# Patient Record
Sex: Male | Born: 1955 | Hispanic: No | Marital: Married | Smoking: Never smoker
Health system: Southern US, Community
[De-identification: ages and names within clinical notes are randomized; demographics above are authoritative.]

## PROBLEM LIST (undated history)

## (undated) HISTORY — PX: ANKLE SURGERY: SHX546

---

## 1998-10-04 ENCOUNTER — Ambulatory Visit (HOSPITAL_BASED_OUTPATIENT_CLINIC_OR_DEPARTMENT_OTHER): Admission: RE | Admit: 1998-10-04 | Discharge: 1998-10-04 | Payer: Self-pay | Admitting: Orthopaedic Surgery

## 2011-02-28 ENCOUNTER — Telehealth: Payer: Self-pay

## 2011-02-28 NOTE — Telephone Encounter (Signed)
Called. Many rings and no answer.  

## 2011-03-03 NOTE — Telephone Encounter (Signed)
Called, many rings and no answer. Mailing a letter to the address in computer. If returned see address on the referral.

## 2011-05-12 ENCOUNTER — Telehealth: Payer: Self-pay

## 2011-05-12 NOTE — Telephone Encounter (Signed)
LMOM to call. Called [phone] and number disconnected. Called [phone] and LMOM to call. ( need to confirm address also)

## 2011-05-20 NOTE — Telephone Encounter (Signed)
Called, LMOM to call.  

## 2011-06-03 NOTE — Telephone Encounter (Signed)
Letter faxed to PCP.  

## 2014-06-09 ENCOUNTER — Other Ambulatory Visit (HOSPITAL_COMMUNITY): Payer: Self-pay | Admitting: Family Medicine

## 2014-06-09 ENCOUNTER — Ambulatory Visit (HOSPITAL_COMMUNITY): Payer: Self-pay

## 2014-06-09 ENCOUNTER — Ambulatory Visit (HOSPITAL_COMMUNITY)
Admission: RE | Admit: 2014-06-09 | Discharge: 2014-06-09 | Disposition: A | Discharge status: Home / Self Care | Payer: 59 | Source: Ambulatory Visit | Attending: Family Medicine | Admitting: Family Medicine

## 2014-06-09 DIAGNOSIS — R918 Other nonspecific abnormal finding of lung field: Secondary | ICD-10-CM | POA: Diagnosis not present

## 2014-06-09 DIAGNOSIS — M25511 Pain in right shoulder: Secondary | ICD-10-CM | POA: Diagnosis not present

## 2014-06-09 DIAGNOSIS — M25512 Pain in left shoulder: Secondary | ICD-10-CM | POA: Diagnosis not present

## 2014-06-09 DIAGNOSIS — R1011 Right upper quadrant pain: Principal | ICD-10-CM

## 2014-06-09 DIAGNOSIS — G8929 Other chronic pain: Secondary | ICD-10-CM

## 2014-06-09 DIAGNOSIS — R140 Abdominal distension (gaseous): Secondary | ICD-10-CM | POA: Diagnosis not present

## 2014-06-09 MED ORDER — IOHEXOL 300 MG/ML  SOLN
100.0000 mL | Freq: Once | INTRAMUSCULAR | Status: AC | PRN
  Administered 2014-06-09: 100 mL via INTRAVENOUS

## 2014-06-16 ENCOUNTER — Other Ambulatory Visit (HOSPITAL_COMMUNITY): Payer: Self-pay | Admitting: Family Medicine

## 2014-06-16 DIAGNOSIS — R918 Other nonspecific abnormal finding of lung field: Secondary | ICD-10-CM

## 2014-06-21 ENCOUNTER — Ambulatory Visit (HOSPITAL_COMMUNITY): Payer: 59

## 2018-08-15 ENCOUNTER — Emergency Department (HOSPITAL_COMMUNITY): Payer: Self-pay

## 2018-08-15 ENCOUNTER — Encounter (HOSPITAL_COMMUNITY): Payer: Self-pay | Admitting: *Deleted

## 2018-08-15 ENCOUNTER — Other Ambulatory Visit: Payer: Self-pay

## 2018-08-15 ENCOUNTER — Emergency Department (HOSPITAL_COMMUNITY)
Admission: EM | Admit: 2018-08-15 | Discharge: 2018-08-15 | Disposition: A | Discharge status: Home / Self Care | Payer: Self-pay | Attending: Emergency Medicine | Admitting: Emergency Medicine

## 2018-08-15 DIAGNOSIS — K5900 Constipation, unspecified: Secondary | ICD-10-CM | POA: Insufficient documentation

## 2018-08-15 DIAGNOSIS — Z79899 Other long term (current) drug therapy: Secondary | ICD-10-CM | POA: Insufficient documentation

## 2018-08-15 DIAGNOSIS — N401 Enlarged prostate with lower urinary tract symptoms: Secondary | ICD-10-CM | POA: Insufficient documentation

## 2018-08-15 DIAGNOSIS — N419 Inflammatory disease of prostate, unspecified: Secondary | ICD-10-CM

## 2018-08-15 DIAGNOSIS — R339 Retention of urine, unspecified: Secondary | ICD-10-CM | POA: Insufficient documentation

## 2018-08-15 DIAGNOSIS — N410 Acute prostatitis: Secondary | ICD-10-CM | POA: Insufficient documentation

## 2018-08-15 LAB — COMPREHENSIVE METABOLIC PANEL
ALT: 23 U/L (ref 0–44)
AST: 17 U/L (ref 15–41)
Albumin: 4.1 g/dL (ref 3.5–5.0)
Alkaline Phosphatase: 74 U/L (ref 38–126)
Anion gap: 9 (ref 5–15)
BUN: 16 mg/dL (ref 8–23)
CO2: 27 mmol/L (ref 22–32)
Calcium: 9 mg/dL (ref 8.9–10.3)
Chloride: 100 mmol/L (ref 98–111)
Creatinine, Ser: 0.93 mg/dL (ref 0.61–1.24)
GFR calc Af Amer: >60 mL/min (ref >60)
GFR calc non Af Amer: >60 mL/min (ref >60)
Glucose, Bld: 154 mg/dL — ABNORMAL HIGH (ref 70–99)
Potassium: 4.4 mmol/L (ref 3.5–5.1)
Sodium: 136 mmol/L (ref 135–145)
Total Bilirubin: 1 mg/dL (ref 0.3–1.2)
Total Protein: 8 g/dL (ref 6.5–8.1)

## 2018-08-15 LAB — CBC WITH DIFFERENTIAL/PLATELET
Abs Immature Granulocytes: 0.1 10*3/uL — ABNORMAL HIGH (ref 0.00–0.07)
Basophils Absolute: 0.1 10*3/uL (ref 0.0–0.1)
Basophils Relative: 0 %
Eosinophils Absolute: 0 10*3/uL (ref 0.0–0.5)
Eosinophils Relative: 0 %
HCT: 49.3 % (ref 39.0–52.0)
Hemoglobin: 15.3 g/dL (ref 13.0–17.0)
Immature Granulocytes: 1 %
Lymphocytes Relative: 6 %
Lymphs Abs: 1.2 10*3/uL (ref 0.7–4.0)
MCH: 25.6 pg — ABNORMAL LOW (ref 26.0–34.0)
MCHC: 31 g/dL (ref 30.0–36.0)
MCV: 82.6 fL (ref 80.0–100.0)
Monocytes Absolute: 1.4 10*3/uL — ABNORMAL HIGH (ref 0.1–1.0)
Monocytes Relative: 7 %
Neutro Abs: 16.4 10*3/uL — ABNORMAL HIGH (ref 1.7–7.7)
Neutrophils Relative %: 86 %
Platelets: 344 10*3/uL (ref 150–400)
RBC: 5.97 MIL/uL — ABNORMAL HIGH (ref 4.22–5.81)
RDW: 14.6 % (ref 11.5–15.5)
WBC: 19.1 10*3/uL — ABNORMAL HIGH (ref 4.0–10.5)
nRBC: 0 % (ref 0.0–0.2)

## 2018-08-15 LAB — URINALYSIS, ROUTINE W REFLEX MICROSCOPIC
Bilirubin Urine: NEGATIVE
Glucose, UA: NEGATIVE mg/dL
Ketones, ur: NEGATIVE mg/dL
Leukocytes,Ua: NEGATIVE
Nitrite: NEGATIVE
Protein, ur: NEGATIVE mg/dL
RBC / HPF: >50 RBC/hpf — ABNORMAL HIGH (ref 0–5)
Specific Gravity, Urine: 1.013 (ref 1.005–1.030)
pH: 6 (ref 5.0–8.0)

## 2018-08-15 LAB — POC OCCULT BLOOD, ED: Fecal Occult Bld: POSITIVE — AB

## 2018-08-15 LAB — LIPASE, BLOOD: Lipase: 35 U/L (ref 11–51)

## 2018-08-15 MED ORDER — CIPROFLOXACIN HCL 500 MG PO TABS: 500.0000 mg | ORAL_TABLET | Freq: Two times a day (BID) | ORAL | 0 refills | Status: DC

## 2018-08-15 MED ORDER — SODIUM CHLORIDE 0.9 % IV BOLUS
500.0000 mL | Freq: Once | INTRAVENOUS | Status: AC
  Administered 2018-08-15: 500 mL via INTRAVENOUS

## 2018-08-15 MED ORDER — CIPROFLOXACIN HCL 250 MG PO TABS
500.0000 mg | ORAL_TABLET | Freq: Once | ORAL | Status: AC
  Administered 2018-08-15: 500 mg via ORAL
  Filled 2018-08-15: qty 2

## 2018-08-15 MED ORDER — IOHEXOL 300 MG/ML  SOLN
100.0000 mL | Freq: Once | INTRAMUSCULAR | Status: AC | PRN
  Administered 2018-08-15: 100 mL via INTRAVENOUS

## 2018-08-15 MED ORDER — TAMSULOSIN HCL 0.4 MG PO CAPS: 0.4000 mg | ORAL_CAPSULE | Freq: Every day | ORAL | 0 refills | Status: DC

## 2018-08-15 NOTE — Discharge Instructions (Addendum)
I am concerned that your prostate is infected which has contributed to "blocking" of your urine stream.  Keep the Foley catheter in place until you see the urologist.  Phone number given for [facility] urologist.  Prescription for antibiotic and medicine to increase your urine flow.

## 2018-08-15 NOTE — ED Provider Notes (Addendum)
[facility] EMERGENCY DEPARTMENT Provider Note   CSN: [id number] Arrival date & time: 08/15/18  8101    History   Chief Complaint Chief Complaint  Patient presents with  . Constipation    HPI Stephen Wilcox is a 63 y.o. male.     Chief complaint constipation.  No solid bowel movement in 1 week.  He does report a very small amount of stool in the last 3 days.  This is atypical for him.  No previous abdominal surgery.  He has used OTC stool softeners with no results.  Review of systems positive for flatus. Blood on toilet tissue and urinary retention.  Severity of symptoms is moderate.  Nothing makes symptoms better or worse.     History reviewed. No pertinent past medical history.  There are no active problems to display for this patient.   Past Surgical History:  Procedure Laterality Date  . ANKLE SURGERY     2010        Home Medications    Prior to Admission medications   Medication Sig Start Date End Date Taking? Authorizing Provider  Multiple Vitamins-Minerals (ICAPS PO) Take 1 tablet by mouth daily.    Yes [provider]  Multiple Vitamins-Minerals (MULTIVITAMIN WITH MINERALS) tablet Take 1 tablet by mouth daily.   Yes [provider]  Multiple Vitamins-Minerals (ZINC PO) Take 1 tablet by mouth daily.    Yes [provider]  ciprofloxacin (CIPRO) 500 MG tablet Take 1 tablet (500 mg total) by mouth 2 (two) times daily. 08/15/18   Nat Christen, MD  tamsulosin (FLOMAX) 0.4 MG CAPS capsule Take 1 capsule (0.4 mg total) by mouth daily. 08/15/18   Nat Christen, MD    Family History History reviewed. No pertinent family history.  Social History Social History   Tobacco Use  . Smoking status: Never Smoker  . Smokeless tobacco: Never Used  Substance Use Topics  . Alcohol use: Never    Frequency: Never  . Drug use: Never     Allergies   Patient has no known allergies.   Review of Systems Review of Systems  All other systems  reviewed and are negative.    Physical Exam Updated Vital Signs BP 130/83   Pulse 78   Temp 98.8 F (37.1 C) (Oral)   Ht 6' (1.829 m)   Wt 108.9 kg   SpO2 95%   BMI 32.55 kg/m   Physical Exam Vitals signs and nursing note reviewed.  Constitutional:      Appearance: He is well-developed.     Comments: Elevated BMI  HENT:     Head: Normocephalic and atraumatic.  Eyes:     Conjunctiva/sclera: Conjunctivae normal.  Neck:     Musculoskeletal: Neck supple.  Cardiovascular:     Rate and Rhythm: Normal rate and regular rhythm.  Pulmonary:     Effort: Pulmonary effort is normal.     Breath sounds: Normal breath sounds.  Abdominal:     General: Bowel sounds are normal.     Palpations: Abdomen is soft.  Genitourinary:    Comments: Rectal exam: No solid stool in the vault.  Brown stool.  Heme positive. Musculoskeletal: Normal range of motion.  Skin:    General: Skin is warm and dry.  Neurological:     Mental Status: He is alert and oriented to person, place, and time.  Psychiatric:        Behavior: Behavior normal.      ED Treatments / Results  Labs (  all labs ordered are listed, but only abnormal results are displayed) Labs Reviewed  CBC WITH DIFFERENTIAL/PLATELET - Abnormal; Notable for the following components:      Result Value   WBC 19.1 (*)    RBC 5.97 (*)    MCH 25.6 (*)    Neutro Abs 16.4 (*)    Monocytes Absolute 1.4 (*)    Abs Immature Granulocytes 0.10 (*)    All other components within normal limits  COMPREHENSIVE METABOLIC PANEL - Abnormal; Notable for the following components:   Glucose, Bld 154 (*)    All other components within normal limits  URINALYSIS, ROUTINE W REFLEX MICROSCOPIC - Abnormal; Notable for the following components:   Hgb urine dipstick MODERATE (*)    RBC / HPF >50 (*)    Bacteria, UA RARE (*)    All other components within normal limits  POC OCCULT BLOOD, ED - Abnormal; Notable for the following components:   Fecal Occult Bld  POSITIVE (*)    All other components within normal limits  LIPASE, BLOOD    EKG None  Radiology Dg Abdomen 1 View  Result Date: 08/15/2018 CLINICAL DATA:  Patient presents to the ER with only small hard bowel movements in 3 days. Patient states last time he has had normal bowel movement one week ago EXAM: ABDOMEN - 1 VIEW COMPARISON:  CT of the abdomen and pelvis on 06/09/2014 FINDINGS: Bowel gas pattern is nonobstructed. Moderate stool burden identified within nondilated loops of colon. Mild degenerative changes in the thoracolumbar spine. No evidence for organomegaly. IMPRESSION: Moderate stool burden.  No evidence for acute  abnormality. Electronically Signed   By: Nolon Nations M.D.   On: 08/15/2018 09:59   Ct Abdomen Pelvis W Contrast  Result Date: 08/15/2018 CLINICAL DATA:  63 year old male with abdominal pain and constipation. EXAM: CT ABDOMEN AND PELVIS WITH CONTRAST TECHNIQUE: Multidetector CT imaging of the abdomen and pelvis was performed using the standard protocol following bolus administration of intravenous contrast. CONTRAST:  181mL OMNIPAQUE IOHEXOL 300 MG/ML  SOLN COMPARISON:  06/09/2014 FINDINGS: Lower chest: No acute abnormality Hepatobiliary: The liver is unremarkable. Cholelithiasis noted without CT evidence of acute cholecystitis. No biliary dilatation. Pancreas: Unremarkable Spleen: Unremarkable Adrenals/Urinary Tract: The kidneys and adrenal glands are unremarkable except for a small LEFT renal cyst. A Foley catheter is present within the bladder. Stomach/Bowel: Stomach is within normal limits. Appendix appears normal. No evidence of bowel wall thickening, distention, or inflammatory changes. Colonic diverticulosis noted without evidence of acute diverticulitis. Vascular/Lymphatic: No significant vascular findings are present. No enlarged abdominal or pelvic lymph nodes. Reproductive: Moderate to marked prostate enlargement is noted. Other: No ascites, focal collection or  pneumoperitoneum. Musculoskeletal: No acute or suspicious bony abnormalities identified. Mild degenerative disc disease at L4-5 and L5-S1 noted. IMPRESSION: 1. No evidence of acute abnormality. 2. Cholelithiasis without CT evidence of acute cholecystitis. 3. Colonic diverticulosis without evidence of acute diverticulitis. 4. Moderate to marked prostate enlargement. Electronically Signed   By: Margarette Canada M.D.   On: 08/15/2018 11:16    Procedures Procedures (including critical care time)  Medications Ordered in ED Medications  sodium chloride 0.9 % bolus 500 mL (0 mLs Intravenous Stopped 08/15/18 1131)  iohexol (OMNIPAQUE) 300 MG/ML solution 100 mL (100 mLs Intravenous Contrast Given 08/15/18 1045)  ciprofloxacin (CIPRO) tablet 500 mg (500 mg Oral Given 08/15/18 1408)     Initial Impression / Assessment and Plan / ED Course  I have reviewed the triage vital signs and the nursing  notes.  Pertinent labs & imaging results that were available during my care of the patient were reviewed by me and considered in my medical decision making (see chart for details).       I am concerned about a bowel obstruction, constipation, infectious etiology.  Will obtain KUB, labs, CT abdomen pelvis.   Patient rechecked multiple times.  Good urinary flow after Foley catheter insertion.  My clinical suspicion is as follows: Patient developed prostatitis with concurrent BPH which is essentially resulted in urinary retention.  Foley catheter has helped this scenario.  Patient is hemodynamically stable.  Will discharge home on Cipro 500 mg twice a day.  He will follow-up with cardiology.  This was discussed in great detail with the patient.     Final Clinical Impressions(s) / ED Diagnoses   Final diagnoses:  Constipation, unspecified constipation type  Urinary retention  Prostatitis, unspecified prostatitis type    ED Discharge Orders         Ordered    ciprofloxacin (CIPRO) 500 MG tablet  2 times daily      08/15/18 1434    tamsulosin (FLOMAX) 0.4 MG CAPS capsule  Daily     08/15/18 1434           Nat Christen, MD 08/15/18 7915    Nat Christen, MD 08/15/18 [phone]

## 2018-08-15 NOTE — ED Triage Notes (Signed)
Patient presents to the ER with only small hard bowel movements in 3 days.  Patient states last time he has had normal bowel movement one week ago.  Patient used OTC stool softener yesterday at 2pm with no results. Patient has been passing gas. Patient has had no history of constipation.  Patient adds has not be able to urinate "normally" since last night.  Denies painful urination or blood in urine.

## 2018-08-20 ENCOUNTER — Ambulatory Visit (INDEPENDENT_AMBULATORY_CARE_PROVIDER_SITE_OTHER): Payer: Self-pay | Admitting: Urology

## 2018-08-20 ENCOUNTER — Other Ambulatory Visit: Payer: Self-pay

## 2018-08-20 DIAGNOSIS — R3914 Feeling of incomplete bladder emptying: Secondary | ICD-10-CM

## 2018-08-27 ENCOUNTER — Ambulatory Visit (INDEPENDENT_AMBULATORY_CARE_PROVIDER_SITE_OTHER): Payer: Self-pay | Admitting: Urology

## 2018-08-27 DIAGNOSIS — R338 Other retention of urine: Secondary | ICD-10-CM

## 2018-08-27 DIAGNOSIS — N401 Enlarged prostate with lower urinary tract symptoms: Secondary | ICD-10-CM

## 2019-09-15 ENCOUNTER — Other Ambulatory Visit (HOSPITAL_COMMUNITY): Payer: Self-pay | Admitting: Family Medicine

## 2019-09-15 ENCOUNTER — Other Ambulatory Visit: Payer: Self-pay

## 2019-09-15 ENCOUNTER — Ambulatory Visit (HOSPITAL_COMMUNITY)
Admission: RE | Admit: 2019-09-15 | Discharge: 2019-09-15 | Disposition: A | Discharge status: Home / Self Care | Payer: 59 | Source: Ambulatory Visit | Attending: Family Medicine | Admitting: Family Medicine

## 2019-09-15 DIAGNOSIS — E6609 Other obesity due to excess calories: Secondary | ICD-10-CM

## 2019-09-15 DIAGNOSIS — R0781 Pleurodynia: Secondary | ICD-10-CM

## 2019-09-24 ENCOUNTER — Emergency Department (HOSPITAL_COMMUNITY): Payer: 59

## 2019-09-24 ENCOUNTER — Encounter (HOSPITAL_COMMUNITY): Payer: Self-pay | Admitting: *Deleted

## 2019-09-24 ENCOUNTER — Other Ambulatory Visit: Payer: Self-pay

## 2019-09-24 ENCOUNTER — Emergency Department (HOSPITAL_COMMUNITY)
Admission: EM | Admit: 2019-09-24 | Discharge: 2019-09-24 | Disposition: A | Discharge status: Home / Self Care | Payer: 59 | Attending: Emergency Medicine | Admitting: Emergency Medicine

## 2019-09-24 DIAGNOSIS — R5383 Other fatigue: Secondary | ICD-10-CM | POA: Diagnosis present

## 2019-09-24 DIAGNOSIS — U071 COVID-19: Secondary | ICD-10-CM | POA: Diagnosis not present

## 2019-09-24 DIAGNOSIS — R531 Weakness: Secondary | ICD-10-CM

## 2019-09-24 LAB — CBC
HCT: 31.9 % — ABNORMAL LOW (ref 39.0–52.0)
Hemoglobin: 9.1 g/dL — ABNORMAL LOW (ref 13.0–17.0)
MCH: 21.6 pg — ABNORMAL LOW (ref 26.0–34.0)
MCHC: 28.5 g/dL — ABNORMAL LOW (ref 30.0–36.0)
MCV: 75.6 fL — ABNORMAL LOW (ref 80.0–100.0)
Platelets: 576 10*3/uL — ABNORMAL HIGH (ref 150–400)
RBC: 4.22 MIL/uL (ref 4.22–5.81)
RDW: 15.4 % (ref 11.5–15.5)
WBC: 9.3 10*3/uL (ref 4.0–10.5)
nRBC: 0 % (ref 0.0–0.2)

## 2019-09-24 LAB — BASIC METABOLIC PANEL
Anion gap: 13 (ref 5–15)
BUN: 23 mg/dL (ref 8–23)
CO2: 25 mmol/L (ref 22–32)
Calcium: 8.5 mg/dL — ABNORMAL LOW (ref 8.9–10.3)
Chloride: 93 mmol/L — ABNORMAL LOW (ref 98–111)
Creatinine, Ser: 1.01 mg/dL (ref 0.61–1.24)
GFR calc Af Amer: >60 mL/min (ref >60)
GFR calc non Af Amer: >60 mL/min (ref >60)
Glucose, Bld: 165 mg/dL — ABNORMAL HIGH (ref 70–99)
Potassium: 3.6 mmol/L (ref 3.5–5.1)
Sodium: 131 mmol/L — ABNORMAL LOW (ref 135–145)

## 2019-09-24 LAB — SARS CORONAVIRUS 2 BY RT PCR (HOSPITAL ORDER, PERFORMED IN ~~LOC~~ HOSPITAL LAB): SARS Coronavirus 2: POSITIVE — AB

## 2019-09-24 MED ORDER — SODIUM CHLORIDE 0.9 % IV BOLUS
1000.0000 mL | Freq: Once | INTRAVENOUS | Status: AC
  Administered 2019-09-24: 1000 mL via INTRAVENOUS

## 2019-09-24 NOTE — Discharge Instructions (Signed)
You were evaluated in the Emergency Department and after careful evaluation, we did not find any emergent condition requiring admission or further testing in the hospital.  Your exam/testing today was overall reassuring.  Please return to the Emergency Department if you experience any worsening of your condition.  Thank you for allowing us to be a part of your care.  

## 2019-09-24 NOTE — ED Provider Notes (Signed)
[facility] Emergency Department Provider Note MRN:  [id number]  Arrival date & time: 09/24/19     Chief Complaint   Fatigue   History of Present Illness   Stephen Wilcox is a 64 y.o. year-old male with no pertinent past medical history presenting to the ED with chief complaint of fatigue.  4 days of severe fatigue, low energy, denies fever, no cough, no chest pain, no shortness of breath, no abdominal pain but does endorse several months of abdominal bloating occasionally and excessive flatulence.  Denies burning with urination, no numbness or weakness, does endorse lightheadedness with standing for the past few days.  Wife is hospitalized with Covid.  Review of Systems  A complete 10 system review of systems was obtained and all systems are negative except as noted in the HPI and PMH.   Patient's Health History   History reviewed. No pertinent past medical history.  Past Surgical History:  Procedure Laterality Date  . ANKLE SURGERY     2010    No family history on file.  Social History   Socioeconomic History  . Marital status: Married    Spouse name: Not on file  . Number of children: Not on file  . Years of education: Not on file  . Highest education level: Not on file  Occupational History  . Not on file  Tobacco Use  . Smoking status: Never Smoker  . Smokeless tobacco: Never Used  Substance and Sexual Activity  . Alcohol use: Never  . Drug use: Never  . Sexual activity: Not on file  Other Topics Concern  . Not on file  Social History Narrative  . Not on file   Social Determinants of Health   Financial Resource Strain:   . Difficulty of Paying Living Expenses: Not on file  Food Insecurity:   . Worried About Charity fundraiser in the Last Year: Not on file  . Ran Out of Food in the Last Year: Not on file  Transportation Needs:   . Lack of Transportation (Medical): Not on file  . Lack of Transportation (Non-Medical): Not on file   Physical Activity:   . Days of Exercise per Week: Not on file  . Minutes of Exercise per Session: Not on file  Stress:   . Feeling of Stress : Not on file  Social Connections:   . Frequency of Communication with Friends and Family: Not on file  . Frequency of Social Gatherings with Friends and Family: Not on file  . Attends Religious Services: Not on file  . Active Member of Clubs or Organizations: Not on file  . Attends Archivist Meetings: Not on file  . Marital Status: Not on file  Intimate Partner Violence:   . Fear of Current or Ex-Partner: Not on file  . Emotionally Abused: Not on file  . Physically Abused: Not on file  . Sexually Abused: Not on file     Physical Exam   Vitals:   09/24/19 2000 09/24/19 2100  BP: 132/88 135/74  Pulse: 92 87  Resp:  18  Temp:    SpO2: 99% 90%    CONSTITUTIONAL: Well-appearing, NAD NEURO:  Alert and oriented x 3, no focal deficits EYES:  eyes equal and reactive ENT/NECK:  no LAD, no JVD CARDIO: Regular rate, well-perfused, normal S1 and S2 PULM:  CTAB no wheezing or rhonchi GI/GU:  normal bowel sounds, non-distended, non-tender MSK/SPINE:  No gross deformities, no edema SKIN:  no rash,  atraumatic PSYCH:  Appropriate speech and behavior  *Additional and/or pertinent findings included in MDM below  Diagnostic and Interventional Summary    EKG Interpretation  Date/Time:    Ventricular Rate:    PR Interval:    QRS Duration:   QT Interval:    QTC Calculation:   R Axis:     Text Interpretation:        Labs Reviewed  SARS CORONAVIRUS 2 BY RT PCR (HOSPITAL ORDER, [place] [facility]) - Abnormal; Notable for the following components:      Result Value   SARS Coronavirus 2 POSITIVE (*)    All other components within normal limits  BASIC METABOLIC PANEL - Abnormal; Notable for the following components:   Sodium 131 (*)    Chloride 93 (*)    Glucose, Bld 165 (*)    Calcium 8.5 (*)    All other  components within normal limits  CBC - Abnormal; Notable for the following components:   Hemoglobin 9.1 (*)    HCT 31.9 (*)    MCV 75.6 (*)    MCH 21.6 (*)    MCHC 28.5 (*)    Platelets 576 (*)    All other components within normal limits  CBG MONITORING, ED    DG Chest Port 1 View  Final Result      Medications  sodium chloride 0.9 % bolus 1,000 mL (0 mLs Intravenous Stopped 09/24/19 2112)     Procedures  /  Critical Care Procedures  ED Course and Medical Decision Making  I have reviewed the triage vital signs, the nursing notes, and pertinent available records from the EMR.  Listed above are laboratory and imaging tests that I personally ordered, reviewed, and interpreted and then considered in my medical decision making (see below for details).  Symptoms fairly explained by COVID-19, has tested positive here in the emergency department.  No increased work of breathing, normal vital signs, no hypoxia, labs revealing hyponatremia favored to be due to dehydration, providing IV fluids, anticipating discharge.       Barth Kirks. [place] Small, [place] [email or web address]  Final Clinical Impressions(s) / ED Diagnoses     ICD-10-CM   1. Weakness  R53.1   2. COVID-19  U07.1     ED Discharge Orders    None       Discharge Instructions Discussed with and Provided to Patient:     Discharge Instructions     You were evaluated in the Emergency Department and after careful evaluation, we did not find any emergent condition requiring admission or further testing in the hospital.  Your exam/testing today was overall reassuring.  Please return to the Emergency Department if you experience any worsening of your condition.  Thank you for allowing [place] to be a part of your care.        Maudie Flakes, MD 09/24/19 2208

## 2019-09-24 NOTE — ED Triage Notes (Signed)
Weak for 4 days

## 2019-09-25 ENCOUNTER — Telehealth (HOSPITAL_COMMUNITY): Payer: Self-pay | Admitting: Oncology

## 2019-09-25 NOTE — Telephone Encounter (Signed)
.  Called to Discuss with patient about Covid symptoms and the use of the monoclonal antibody infusion for those with mild to moderate Covid symptoms and at a high risk of hospitalization.     Pt is qualified for this infusion due to co-morbid conditions and/or a member of an at-risk group.     There are no problems to display for this patient.   Patient declines infusion at this time. Symptoms tier reviewed as well as criteria for ending isolation. Preventative practices reviewed. Patient verbalized understanding.    Patient advised to call back if he/she decides that he/she does want to get infusion. Callback number to the infusion center given. Patient advised to go to Urgent care or ED with severe symptoms.    Faythe Casa, NP 09/25/2019 10:14 AM

## 2019-09-28 ENCOUNTER — Emergency Department (HOSPITAL_COMMUNITY): Payer: 59

## 2019-09-28 ENCOUNTER — Encounter (HOSPITAL_COMMUNITY): Payer: Self-pay | Admitting: Emergency Medicine

## 2019-09-28 ENCOUNTER — Inpatient Hospital Stay (HOSPITAL_COMMUNITY)
Admission: EM | Admit: 2019-09-28 | Discharge: 2019-10-25 | DRG: 177 | Disposition: A | Discharge status: Hospice | Payer: 59 | Attending: Internal Medicine | Admitting: Internal Medicine

## 2019-09-28 DIAGNOSIS — Z515 Encounter for palliative care: Secondary | ICD-10-CM | POA: Diagnosis not present

## 2019-09-28 DIAGNOSIS — N390 Urinary tract infection, site not specified: Secondary | ICD-10-CM | POA: Diagnosis not present

## 2019-09-28 DIAGNOSIS — R6521 Severe sepsis with septic shock: Secondary | ICD-10-CM | POA: Diagnosis not present

## 2019-09-28 DIAGNOSIS — R160 Hepatomegaly, not elsewhere classified: Secondary | ICD-10-CM

## 2019-09-28 DIAGNOSIS — I38 Endocarditis, valve unspecified: Secondary | ICD-10-CM | POA: Diagnosis not present

## 2019-09-28 DIAGNOSIS — U071 COVID-19: Principal | ICD-10-CM | POA: Diagnosis present

## 2019-09-28 DIAGNOSIS — E876 Hypokalemia: Secondary | ICD-10-CM

## 2019-09-28 DIAGNOSIS — R1310 Dysphagia, unspecified: Secondary | ICD-10-CM | POA: Diagnosis not present

## 2019-09-28 DIAGNOSIS — D75839 Thrombocytosis, unspecified: Secondary | ICD-10-CM

## 2019-09-28 DIAGNOSIS — E872 Acidosis, unspecified: Secondary | ICD-10-CM

## 2019-09-28 DIAGNOSIS — D509 Iron deficiency anemia, unspecified: Secondary | ICD-10-CM

## 2019-09-28 DIAGNOSIS — G9341 Metabolic encephalopathy: Secondary | ICD-10-CM | POA: Diagnosis not present

## 2019-09-28 DIAGNOSIS — I4892 Unspecified atrial flutter: Secondary | ICD-10-CM | POA: Diagnosis not present

## 2019-09-28 DIAGNOSIS — C169 Malignant neoplasm of stomach, unspecified: Secondary | ICD-10-CM | POA: Diagnosis present

## 2019-09-28 DIAGNOSIS — D473 Essential (hemorrhagic) thrombocythemia: Secondary | ICD-10-CM

## 2019-09-28 DIAGNOSIS — A4151 Sepsis due to Escherichia coli [E. coli]: Secondary | ICD-10-CM | POA: Diagnosis not present

## 2019-09-28 DIAGNOSIS — E669 Obesity, unspecified: Secondary | ICD-10-CM | POA: Diagnosis present

## 2019-09-28 DIAGNOSIS — Z7189 Other specified counseling: Secondary | ICD-10-CM | POA: Diagnosis not present

## 2019-09-28 DIAGNOSIS — R933 Abnormal findings on diagnostic imaging of other parts of digestive tract: Secondary | ICD-10-CM | POA: Diagnosis not present

## 2019-09-28 DIAGNOSIS — K317 Polyp of stomach and duodenum: Secondary | ICD-10-CM | POA: Diagnosis present

## 2019-09-28 DIAGNOSIS — E11649 Type 2 diabetes mellitus with hypoglycemia without coma: Secondary | ICD-10-CM | POA: Diagnosis not present

## 2019-09-28 DIAGNOSIS — F4321 Adjustment disorder with depressed mood: Secondary | ICD-10-CM | POA: Diagnosis not present

## 2019-09-28 DIAGNOSIS — K922 Gastrointestinal hemorrhage, unspecified: Secondary | ICD-10-CM | POA: Diagnosis not present

## 2019-09-28 DIAGNOSIS — E871 Hypo-osmolality and hyponatremia: Secondary | ICD-10-CM

## 2019-09-28 DIAGNOSIS — Z66 Do not resuscitate: Secondary | ICD-10-CM | POA: Diagnosis not present

## 2019-09-28 DIAGNOSIS — J069 Acute upper respiratory infection, unspecified: Secondary | ICD-10-CM | POA: Diagnosis present

## 2019-09-28 DIAGNOSIS — K3189 Other diseases of stomach and duodenum: Secondary | ICD-10-CM | POA: Diagnosis not present

## 2019-09-28 DIAGNOSIS — N401 Enlarged prostate with lower urinary tract symptoms: Secondary | ICD-10-CM | POA: Diagnosis not present

## 2019-09-28 DIAGNOSIS — D649 Anemia, unspecified: Secondary | ICD-10-CM

## 2019-09-28 DIAGNOSIS — J9601 Acute respiratory failure with hypoxia: Secondary | ICD-10-CM

## 2019-09-28 DIAGNOSIS — J1282 Pneumonia due to coronavirus disease 2019: Secondary | ICD-10-CM | POA: Diagnosis present

## 2019-09-28 DIAGNOSIS — D62 Acute posthemorrhagic anemia: Secondary | ICD-10-CM | POA: Diagnosis present

## 2019-09-28 DIAGNOSIS — R627 Adult failure to thrive: Secondary | ICD-10-CM | POA: Diagnosis not present

## 2019-09-28 DIAGNOSIS — A419 Sepsis, unspecified organism: Secondary | ICD-10-CM

## 2019-09-28 DIAGNOSIS — R739 Hyperglycemia, unspecified: Secondary | ICD-10-CM | POA: Diagnosis not present

## 2019-09-28 DIAGNOSIS — Z6832 Body mass index (BMI) 32.0-32.9, adult: Secondary | ICD-10-CM | POA: Diagnosis not present

## 2019-09-28 DIAGNOSIS — I4891 Unspecified atrial fibrillation: Secondary | ICD-10-CM

## 2019-09-28 DIAGNOSIS — C160 Malignant neoplasm of cardia: Secondary | ICD-10-CM | POA: Diagnosis not present

## 2019-09-28 DIAGNOSIS — R338 Other retention of urine: Secondary | ICD-10-CM | POA: Diagnosis not present

## 2019-09-28 DIAGNOSIS — N179 Acute kidney failure, unspecified: Secondary | ICD-10-CM | POA: Diagnosis not present

## 2019-09-28 DIAGNOSIS — T380X5A Adverse effect of glucocorticoids and synthetic analogues, initial encounter: Secondary | ICD-10-CM | POA: Diagnosis not present

## 2019-09-28 DIAGNOSIS — C787 Secondary malignant neoplasm of liver and intrahepatic bile duct: Secondary | ICD-10-CM

## 2019-09-28 DIAGNOSIS — E1165 Type 2 diabetes mellitus with hyperglycemia: Secondary | ICD-10-CM | POA: Diagnosis present

## 2019-09-28 DIAGNOSIS — C155 Malignant neoplasm of lower third of esophagus: Secondary | ICD-10-CM | POA: Diagnosis not present

## 2019-09-28 DIAGNOSIS — Z95828 Presence of other vascular implants and grafts: Secondary | ICD-10-CM

## 2019-09-28 DIAGNOSIS — D500 Iron deficiency anemia secondary to blood loss (chronic): Secondary | ICD-10-CM | POA: Diagnosis not present

## 2019-09-28 DIAGNOSIS — R748 Abnormal levels of other serum enzymes: Secondary | ICD-10-CM | POA: Diagnosis not present

## 2019-09-28 DIAGNOSIS — R6520 Severe sepsis without septic shock: Secondary | ICD-10-CM

## 2019-09-28 LAB — CBC WITH DIFFERENTIAL/PLATELET
Abs Immature Granulocytes: 0.11 10*3/uL — ABNORMAL HIGH (ref 0.00–0.07)
Basophils Absolute: 0 10*3/uL (ref 0.0–0.1)
Basophils Relative: 0 %
Eosinophils Absolute: 0 10*3/uL (ref 0.0–0.5)
Eosinophils Relative: 0 %
HCT: 25.5 % — ABNORMAL LOW (ref 39.0–52.0)
Hemoglobin: 7.3 g/dL — ABNORMAL LOW (ref 13.0–17.0)
Immature Granulocytes: 1 %
Lymphocytes Relative: 3 %
Lymphs Abs: 0.2 10*3/uL — ABNORMAL LOW (ref 0.7–4.0)
MCH: 21.6 pg — ABNORMAL LOW (ref 26.0–34.0)
MCHC: 28.6 g/dL — ABNORMAL LOW (ref 30.0–36.0)
MCV: 75.4 fL — ABNORMAL LOW (ref 80.0–100.0)
Monocytes Absolute: 0.3 10*3/uL (ref 0.1–1.0)
Monocytes Relative: 3 %
Neutro Abs: 9.1 10*3/uL — ABNORMAL HIGH (ref 1.7–7.7)
Neutrophils Relative %: 93 %
Platelets: 545 10*3/uL — ABNORMAL HIGH (ref 150–400)
RBC: 3.38 MIL/uL — ABNORMAL LOW (ref 4.22–5.81)
RDW: 15.9 % — ABNORMAL HIGH (ref 11.5–15.5)
WBC: 9.7 10*3/uL (ref 4.0–10.5)
nRBC: 0.2 % (ref 0.0–0.2)

## 2019-09-28 LAB — COMPREHENSIVE METABOLIC PANEL
ALT: 35 U/L (ref 0–44)
AST: 53 U/L — ABNORMAL HIGH (ref 15–41)
Albumin: 2.8 g/dL — ABNORMAL LOW (ref 3.5–5.0)
Alkaline Phosphatase: 55 U/L (ref 38–126)
Anion gap: 12 (ref 5–15)
BUN: 18 mg/dL (ref 8–23)
CO2: 28 mmol/L (ref 22–32)
Calcium: 8 mg/dL — ABNORMAL LOW (ref 8.9–10.3)
Chloride: 91 mmol/L — ABNORMAL LOW (ref 98–111)
Creatinine, Ser: 0.9 mg/dL (ref 0.61–1.24)
GFR calc Af Amer: >60 mL/min (ref >60)
GFR calc non Af Amer: >60 mL/min (ref >60)
Glucose, Bld: 198 mg/dL — ABNORMAL HIGH (ref 70–99)
Potassium: 3.3 mmol/L — ABNORMAL LOW (ref 3.5–5.1)
Sodium: 131 mmol/L — ABNORMAL LOW (ref 135–145)
Total Bilirubin: 0.4 mg/dL (ref 0.3–1.2)
Total Protein: 6.7 g/dL (ref 6.5–8.1)

## 2019-09-28 LAB — D-DIMER, QUANTITATIVE: D-Dimer, Quant: 1.89 ug/mL-FEU — ABNORMAL HIGH (ref 0.00–0.50)

## 2019-09-28 LAB — FIBRINOGEN: Fibrinogen: 698 mg/dL — ABNORMAL HIGH (ref 210–475)

## 2019-09-28 LAB — TRIGLYCERIDES: Triglycerides: 49 mg/dL (ref <150)

## 2019-09-28 LAB — PROCALCITONIN: Procalcitonin: 0.17 ng/mL

## 2019-09-28 LAB — LACTATE DEHYDROGENASE: LDH: 304 U/L — ABNORMAL HIGH (ref 98–192)

## 2019-09-28 LAB — C-REACTIVE PROTEIN: CRP: 17.4 mg/dL — ABNORMAL HIGH (ref <1.0)

## 2019-09-28 LAB — FERRITIN: Ferritin: 59 ng/mL (ref 24–336)

## 2019-09-28 MED ORDER — GUAIFENESIN-DM 100-10 MG/5ML PO SYRP
10.0000 mL | ORAL_SOLUTION | ORAL | Status: DC | PRN
  Administered 2019-09-29 – 2019-10-22 (×4): 10 mL via ORAL
  Filled 2019-09-28 (×5): qty 10

## 2019-09-28 MED ORDER — ZINC SULFATE 220 (50 ZN) MG PO CAPS
220.0000 mg | ORAL_CAPSULE | Freq: Every day | ORAL | Status: DC
  Administered 2019-09-29 – 2019-10-25 (×27): 220 mg via ORAL
  Filled 2019-09-28 (×27): qty 1

## 2019-09-28 MED ORDER — ALBUTEROL SULFATE HFA 108 (90 BASE) MCG/ACT IN AERS
2.0000 | INHALATION_SPRAY | Freq: Four times a day (QID) | RESPIRATORY_TRACT | Status: DC
  Administered 2019-09-29 – 2019-10-01 (×10): 2 via RESPIRATORY_TRACT
  Filled 2019-09-28 (×2): qty 6.7

## 2019-09-28 MED ORDER — SODIUM CHLORIDE 0.9 % IV SOLN
100.0000 mg | Freq: Every day | INTRAVENOUS | Status: AC
  Administered 2019-09-30 – 2019-10-03 (×4): 100 mg via INTRAVENOUS
  Filled 2019-09-28 (×4): qty 20

## 2019-09-28 MED ORDER — SODIUM CHLORIDE 0.9 % IV SOLN: 100.0000 mg | Freq: Every day | INTRAVENOUS | Status: DC

## 2019-09-28 MED ORDER — ASCORBIC ACID 500 MG PO TABS
500.0000 mg | ORAL_TABLET | Freq: Every day | ORAL | Status: DC
  Administered 2019-09-29 – 2019-10-25 (×27): 500 mg via ORAL
  Filled 2019-09-28 (×27): qty 1

## 2019-09-28 MED ORDER — DEXAMETHASONE SODIUM PHOSPHATE 10 MG/ML IJ SOLN
10.0000 mg | Freq: Once | INTRAMUSCULAR | Status: AC
  Administered 2019-09-28: 10 mg via INTRAVENOUS
  Filled 2019-09-28: qty 1

## 2019-09-28 MED ORDER — SODIUM CHLORIDE 0.9 % IV SOLN: 200.0000 mg | Freq: Once | INTRAVENOUS | Status: DC

## 2019-09-28 MED ORDER — SODIUM CHLORIDE 0.9 % IV SOLN
10.0000 mL/h | Freq: Once | INTRAVENOUS | Status: AC
  Administered 2019-09-29: 10 mL/h via INTRAVENOUS

## 2019-09-28 MED ORDER — SODIUM CHLORIDE 0.9 % IV SOLN
100.0000 mg | INTRAVENOUS | Status: AC
  Administered 2019-09-29 (×2): 100 mg via INTRAVENOUS
  Filled 2019-09-28: qty 20

## 2019-09-28 MED ORDER — DEXAMETHASONE SODIUM PHOSPHATE 10 MG/ML IJ SOLN: 6.0000 mg | INTRAMUSCULAR | Status: DC

## 2019-09-28 MED ORDER — HYDROCOD POLST-CPM POLST ER 10-8 MG/5ML PO SUER
5.0000 mL | Freq: Two times a day (BID) | ORAL | Status: DC | PRN
  Administered 2019-09-30 – 2019-10-07 (×5): 5 mL via ORAL
  Filled 2019-09-28 (×7): qty 5

## 2019-09-28 NOTE — ED Provider Notes (Signed)
[facility] Emergency Department Provider Note MRN:  [id number]  Arrival date & time: 09/28/19     Chief Complaint   Covid Positive   History of Present Illness   Stephen Wilcox is a 64 y.o. year-old male with no pertinent past medical history presenting to the ED with chief complaint of Covid positive.  Patient began feeling ill about 5 or 6 days ago, was seen here in the emergency department for severe weakness, discharged with diagnosis of COVID-19.  Has gotten worse and worse with shortness of breath, worsening cough, found to be hypoxic with EMS, down to 82%, feeling better now on oxygen.  Review of Systems  A complete 10 system review of systems was obtained and all systems are negative except as noted in the HPI and PMH.   Patient's Health History   History reviewed. No pertinent past medical history.  Past Surgical History:  Procedure Laterality Date   ANKLE SURGERY     2010    No family history on file.  Social History   Socioeconomic History   Marital status: Married    Spouse name: Not on file   Number of children: Not on file   Years of education: Not on file   Highest education level: Not on file  Occupational History   Not on file  Tobacco Use   Smoking status: Never Smoker   Smokeless tobacco: Never Used  Substance and Sexual Activity   Alcohol use: Never   Drug use: Never   Sexual activity: Not on file  Other Topics Concern   Not on file  Social History Narrative   Not on file   Social Determinants of Health   Financial Resource Strain:    Difficulty of Paying Living Expenses: Not on file  Food Insecurity:    Worried About [place] in the Last Year: Not on file   Ran Out of Food in the Last Year: Not on file  Transportation Needs:    Lack of Transportation (Medical): Not on file   Lack of Transportation (Non-Medical): Not on file  Physical Activity:    Days of Exercise per Week: Not on  file   Minutes of Exercise per Session: Not on file  Stress:    Feeling of Stress : Not on file  Social Connections:    Frequency of Communication with Friends and Family: Not on file   Frequency of Social Gatherings with Friends and Family: Not on file   Attends Religious Services: Not on file   Active Member of Clubs or Organizations: Not on file   Attends Archivist Meetings: Not on file   Marital Status: Not on file  Intimate Partner Violence:    Fear of Current or Ex-Partner: Not on file   Emotionally Abused: Not on file   Physically Abused: Not on file   Sexually Abused: Not on file     Physical Exam   Vitals:   09/28/19 2210  BP: 112/64  Pulse: (!) 108  Resp: (!) 28  Temp: (!) 101.6 F (38.7 C)  SpO2: 93%    CONSTITUTIONAL: Well-appearing, NAD NEURO:  Alert and oriented x 3, no focal deficits EYES:  eyes equal and reactive ENT/NECK:  no LAD, no JVD CARDIO: Tachycardic rate, well-perfused, normal S1 and S2 PULM:  CTAB no wheezing or rhonchi GI/GU:  normal bowel sounds, non-distended, non-tender MSK/SPINE:  No gross deformities, no edema SKIN:  no rash, atraumatic PSYCH:  Appropriate speech and  behavior  *Additional and/or pertinent findings included in MDM below  Diagnostic and Interventional Summary    EKG Interpretation  Date/Time:    Ventricular Rate:    PR Interval:    QRS Duration:   QT Interval:    QTC Calculation:   R Axis:     Text Interpretation:        Labs Reviewed  CBC WITH DIFFERENTIAL/PLATELET - Abnormal; Notable for the following components:      Result Value   RBC 3.38 (*)    Hemoglobin 7.3 (*)    HCT 25.5 (*)    MCV 75.4 (*)    MCH 21.6 (*)    MCHC 28.6 (*)    RDW 15.9 (*)    Platelets 545 (*)    Neutro Abs 9.1 (*)    Lymphs Abs 0.2 (*)    Abs Immature Granulocytes 0.11 (*)    All other components within normal limits  COMPREHENSIVE METABOLIC PANEL - Abnormal; Notable for the following components:    Sodium 131 (*)    Potassium 3.3 (*)    Chloride 91 (*)    Glucose, Bld 198 (*)    Calcium 8.0 (*)    Albumin 2.8 (*)    AST 53 (*)    All other components within normal limits  D-DIMER, QUANTITATIVE (NOT AT [facility]) - Abnormal; Notable for the following components:   D-Dimer, Quant 1.89 (*)    All other components within normal limits  LACTATE DEHYDROGENASE - Abnormal; Notable for the following components:   LDH 304 (*)    All other components within normal limits  FIBRINOGEN - Abnormal; Notable for the following components:   Fibrinogen 698 (*)    All other components within normal limits  C-REACTIVE PROTEIN - Abnormal; Notable for the following components:   CRP 17.4 (*)    All other components within normal limits  CULTURE, BLOOD (ROUTINE X 2)  CULTURE, BLOOD (ROUTINE X 2)  PROCALCITONIN  FERRITIN  TRIGLYCERIDES  LACTIC ACID, PLASMA  LACTIC ACID, PLASMA  PREPARE RBC (CROSSMATCH)    DG Chest Port 1 View  Final Result      Medications  0.9 %  sodium chloride infusion (has no administration in time range)  dexamethasone (DECADRON) injection 10 mg (10 mg Intravenous Given 09/28/19 2254)     Procedures  /  Critical Care .Critical Care Performed by: Maudie Flakes, MD Authorized by: Maudie Flakes, MD   Critical care provider statement:    Critical care time (minutes):  32   Critical care was necessary to treat or prevent imminent or life-threatening deterioration of the following conditions: COVID-19 with hypoxic respiratory failure.   Critical care was time spent personally by me on the following activities:  Discussions with consultants, evaluation of patient's response to treatment, examination of patient, ordering and performing treatments and interventions, ordering and review of laboratory studies, ordering and review of radiographic studies, pulse oximetry, re-evaluation of patient's condition, obtaining history from patient or surrogate and review of old  charts    ED Course and Medical Decision Making  I have reviewed the triage vital signs, the nursing notes, and pertinent available records from the EMR.  Listed above are laboratory and imaging tests that I personally ordered, reviewed, and interpreted and then considered in my medical decision making (see below for details).  COVID-19 with new hypoxia, currently requiring 3 L nasal cannula.  In no acute distress, febrile, mildly tachycardic.  Awaiting laboratory assessment, will need admission.  Labs returned with significant downtrend in hemoglobin compared to 4 days prior.  Hemoccult is positive.  Given patient's oxygen requirement will transfuse 2 units packed red blood cells.  Admitted to hospital service for further care.      Barth Kirks. Sedonia Small, MD [place] [email or web address]  Final Clinical Impressions(s) / ED Diagnoses     ICD-10-CM   1. COVID-19  U07.1   2. Acute respiratory failure with hypoxia (HCC)  J96.01     ED Discharge Orders    None       Discharge Instructions Discussed with and Provided to Patient:   Discharge Instructions   None       Maudie Flakes, MD 09/28/19 2353

## 2019-09-28 NOTE — ED Triage Notes (Addendum)
Pt C/O weakness and dizziness. Pt O2 on RA upon EMS arrival 82%. Pt is 94% on 2L at this time. Pt COVID positive.

## 2019-09-29 DIAGNOSIS — K922 Gastrointestinal hemorrhage, unspecified: Secondary | ICD-10-CM

## 2019-09-29 DIAGNOSIS — R739 Hyperglycemia, unspecified: Secondary | ICD-10-CM

## 2019-09-29 DIAGNOSIS — D75839 Thrombocytosis, unspecified: Secondary | ICD-10-CM

## 2019-09-29 DIAGNOSIS — U071 COVID-19: Secondary | ICD-10-CM | POA: Diagnosis present

## 2019-09-29 DIAGNOSIS — A419 Sepsis, unspecified organism: Secondary | ICD-10-CM

## 2019-09-29 DIAGNOSIS — D649 Anemia, unspecified: Secondary | ICD-10-CM

## 2019-09-29 DIAGNOSIS — R748 Abnormal levels of other serum enzymes: Secondary | ICD-10-CM

## 2019-09-29 DIAGNOSIS — E872 Acidosis, unspecified: Secondary | ICD-10-CM

## 2019-09-29 DIAGNOSIS — E876 Hypokalemia: Secondary | ICD-10-CM

## 2019-09-29 DIAGNOSIS — D509 Iron deficiency anemia, unspecified: Secondary | ICD-10-CM

## 2019-09-29 DIAGNOSIS — J9601 Acute respiratory failure with hypoxia: Secondary | ICD-10-CM

## 2019-09-29 DIAGNOSIS — C787 Secondary malignant neoplasm of liver and intrahepatic bile duct: Secondary | ICD-10-CM

## 2019-09-29 DIAGNOSIS — R6521 Severe sepsis with septic shock: Secondary | ICD-10-CM

## 2019-09-29 DIAGNOSIS — E871 Hypo-osmolality and hyponatremia: Secondary | ICD-10-CM

## 2019-09-29 DIAGNOSIS — J069 Acute upper respiratory infection, unspecified: Secondary | ICD-10-CM | POA: Diagnosis present

## 2019-09-29 LAB — CBC WITH DIFFERENTIAL/PLATELET
Abs Immature Granulocytes: 0.04 10*3/uL (ref 0.00–0.07)
Basophils Absolute: 0 10*3/uL (ref 0.0–0.1)
Basophils Relative: 0 %
Eosinophils Absolute: 0 10*3/uL (ref 0.0–0.5)
Eosinophils Relative: 0 %
HCT: 24.6 % — ABNORMAL LOW (ref 39.0–52.0)
Hemoglobin: 7 g/dL — ABNORMAL LOW (ref 13.0–17.0)
Immature Granulocytes: 1 %
Lymphocytes Relative: 6 %
Lymphs Abs: 0.3 10*3/uL — ABNORMAL LOW (ref 0.7–4.0)
MCH: 21.4 pg — ABNORMAL LOW (ref 26.0–34.0)
MCHC: 28.5 g/dL — ABNORMAL LOW (ref 30.0–36.0)
MCV: 75.2 fL — ABNORMAL LOW (ref 80.0–100.0)
Monocytes Absolute: 0.1 10*3/uL (ref 0.1–1.0)
Monocytes Relative: 2 %
Neutro Abs: 4.3 10*3/uL (ref 1.7–7.7)
Neutrophils Relative %: 91 %
Platelets: 453 10*3/uL — ABNORMAL HIGH (ref 150–400)
RBC: 3.27 MIL/uL — ABNORMAL LOW (ref 4.22–5.81)
RDW: 15.7 % — ABNORMAL HIGH (ref 11.5–15.5)
WBC: 4.8 10*3/uL (ref 4.0–10.5)
nRBC: 0.4 % — ABNORMAL HIGH (ref 0.0–0.2)

## 2019-09-29 LAB — COMPREHENSIVE METABOLIC PANEL
ALT: 35 U/L (ref 0–44)
AST: 59 U/L — ABNORMAL HIGH (ref 15–41)
Albumin: 2.3 g/dL — ABNORMAL LOW (ref 3.5–5.0)
Alkaline Phosphatase: 48 U/L (ref 38–126)
Anion gap: 12 (ref 5–15)
BUN: 18 mg/dL (ref 8–23)
CO2: 27 mmol/L (ref 22–32)
Calcium: 7.9 mg/dL — ABNORMAL LOW (ref 8.9–10.3)
Chloride: 94 mmol/L — ABNORMAL LOW (ref 98–111)
Creatinine, Ser: 0.73 mg/dL (ref 0.61–1.24)
GFR calc Af Amer: >60 mL/min (ref >60)
GFR calc non Af Amer: >60 mL/min (ref >60)
Glucose, Bld: 236 mg/dL — ABNORMAL HIGH (ref 70–99)
Potassium: 4.8 mmol/L (ref 3.5–5.1)
Sodium: 133 mmol/L — ABNORMAL LOW (ref 135–145)
Total Bilirubin: 0.9 mg/dL (ref 0.3–1.2)
Total Protein: 5.7 g/dL — ABNORMAL LOW (ref 6.5–8.1)

## 2019-09-29 LAB — LACTIC ACID, PLASMA
Lactic Acid, Venous: 1.7 mmol/L (ref 0.5–1.9)
Lactic Acid, Venous: 2 mmol/L (ref 0.5–1.9)

## 2019-09-29 LAB — ABO/RH: ABO/RH(D): A POS

## 2019-09-29 LAB — HIV ANTIBODY (ROUTINE TESTING W REFLEX): HIV Screen 4th Generation wRfx: NONREACTIVE

## 2019-09-29 LAB — PHOSPHORUS: Phosphorus: 3 mg/dL (ref 2.5–4.6)

## 2019-09-29 LAB — C-REACTIVE PROTEIN: CRP: 15.8 mg/dL — ABNORMAL HIGH (ref <1.0)

## 2019-09-29 LAB — D-DIMER, QUANTITATIVE: D-Dimer, Quant: 1.48 ug/mL-FEU — ABNORMAL HIGH (ref 0.00–0.50)

## 2019-09-29 LAB — HEMOGLOBIN A1C
Hgb A1c MFr Bld: 6.9 % — ABNORMAL HIGH (ref 4.8–5.6)
Mean Plasma Glucose: 151.33 mg/dL

## 2019-09-29 LAB — PREPARE RBC (CROSSMATCH)

## 2019-09-29 LAB — IRON AND TIBC
Iron: 8 ug/dL — ABNORMAL LOW (ref 45–182)
Saturation Ratios: 3 % — ABNORMAL LOW (ref 17.9–39.5)
TIBC: 257 ug/dL (ref 250–450)
UIBC: 249 ug/dL

## 2019-09-29 LAB — FERRITIN: Ferritin: 70 ng/mL (ref 24–336)

## 2019-09-29 LAB — MAGNESIUM: Magnesium: 2.4 mg/dL (ref 1.7–2.4)

## 2019-09-29 LAB — POC OCCULT BLOOD, ED: Fecal Occult Bld: POSITIVE — AB

## 2019-09-29 LAB — CBG MONITORING, ED: Glucose-Capillary: 342 mg/dL — ABNORMAL HIGH (ref 70–99)

## 2019-09-29 MED ORDER — DILTIAZEM HCL 30 MG PO TABS
30.0000 mg | ORAL_TABLET | Freq: Four times a day (QID) | ORAL | Status: DC
  Administered 2019-09-29 – 2019-10-03 (×15): 30 mg via ORAL
  Filled 2019-09-29 (×14): qty 1

## 2019-09-29 MED ORDER — SODIUM CHLORIDE 0.9 % IV SOLN
80.0000 mg | Freq: Once | INTRAVENOUS | Status: AC
  Administered 2019-09-29: 80 mg via INTRAVENOUS
  Filled 2019-09-29: qty 80

## 2019-09-29 MED ORDER — POTASSIUM CHLORIDE CRYS ER 20 MEQ PO TBCR
40.0000 meq | EXTENDED_RELEASE_TABLET | Freq: Once | ORAL | Status: AC
  Administered 2019-09-29: 40 meq via ORAL
  Filled 2019-09-29: qty 2

## 2019-09-29 MED ORDER — INSULIN ASPART 100 UNIT/ML ~~LOC~~ SOLN
0.0000 [IU] | Freq: Three times a day (TID) | SUBCUTANEOUS | Status: DC
  Administered 2019-09-30: 4 [IU] via SUBCUTANEOUS
  Administered 2019-09-30: 3 [IU] via SUBCUTANEOUS
  Administered 2019-09-30 – 2019-10-01 (×2): 5 [IU] via SUBCUTANEOUS
  Filled 2019-09-29 (×2): qty 1

## 2019-09-29 MED ORDER — PANTOPRAZOLE SODIUM 40 MG IV SOLR
40.0000 mg | Freq: Two times a day (BID) | INTRAVENOUS | Status: DC
  Administered 2019-10-02 – 2019-10-07 (×11): 40 mg via INTRAVENOUS
  Filled 2019-09-29 (×10): qty 40

## 2019-09-29 MED ORDER — METHYLPREDNISOLONE SODIUM SUCC 125 MG IJ SOLR
60.0000 mg | Freq: Two times a day (BID) | INTRAMUSCULAR | Status: DC
  Administered 2019-09-29 – 2019-10-03 (×9): 60 mg via INTRAVENOUS
  Filled 2019-09-29 (×7): qty 2

## 2019-09-29 MED ORDER — INSULIN ASPART 100 UNIT/ML ~~LOC~~ SOLN
0.0000 [IU] | Freq: Every day | SUBCUTANEOUS | Status: DC
  Administered 2019-09-29 – 2019-09-30 (×2): 4 [IU] via SUBCUTANEOUS
  Filled 2019-09-29: qty 1

## 2019-09-29 MED ORDER — TAMSULOSIN HCL 0.4 MG PO CAPS
0.4000 mg | ORAL_CAPSULE | Freq: Every day | ORAL | Status: DC
  Administered 2019-09-29 – 2019-10-14 (×16): 0.4 mg via ORAL
  Filled 2019-09-29 (×16): qty 1

## 2019-09-29 MED ORDER — SODIUM CHLORIDE 0.9 % IV SOLN
8.0000 mg/h | INTRAVENOUS | Status: AC
  Administered 2019-09-29: 8 mg/h via INTRAVENOUS
  Filled 2019-09-29 (×8): qty 80

## 2019-09-29 MED ORDER — METHYLPREDNISOLONE SODIUM SUCC 40 MG IJ SOLR
INTRAMUSCULAR | Status: AC
  Administered 2019-09-29: 60 mg
  Filled 2019-09-29: qty 2

## 2019-09-29 MED ORDER — SODIUM CHLORIDE 0.9% IV SOLUTION: Freq: Once | INTRAVENOUS | Status: DC

## 2019-09-29 MED ORDER — METOPROLOL TARTRATE 25 MG PO TABS
25.0000 mg | ORAL_TABLET | Freq: Three times a day (TID) | ORAL | Status: DC
  Administered 2019-09-30 – 2019-10-12 (×33): 25 mg via ORAL
  Filled 2019-09-29 (×37): qty 1

## 2019-09-29 NOTE — Consult Note (Signed)
[name], M.D. Gastroenterology & Hepatology                                           Patient Name: Stephen Wilcox Account #: @FLAACCTNO @   MRN: [id number] Admission Date: 09/28/2019 Date of Evaluation:  09/29/2019 Time of Evaluation: 5:04 PM   Referring Physician: Roxan Hockey, MD  Chief Complaint:  Anemia  HPI:  This is a 64 y.o. male with history of recent COVID 19 infection, who came to the hospital for evaluation of worsening shortness of breath, fatigue and lightheadedness.  Gastroenterology was consulted due to severe iron deficiency anemia.  I called the patient via internal phone to discuss his symptoms given his COVID-19 infection status.  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  She recently was hospitalized after acquiring COVID-19 infection.  The patient presented for the last week worsening fatigue, lightheadedness and shortness of breath for which he had to come to the hospital on 09/24/2019.  At that time the patient was diagnosed with COVID-19 infection and was discharged home.  The patient was offered to receive the monoclonal antibody therapy as outpatient but the patient declined to receive this.  He never received any of the COVID-19 vaccinations.  However, the patient had worsening of his symptoms and decided to come to the hospital yesterday.  The patient denies having any melena, hematochezia, hematemesis, abdominal pain, nausea or vomiting.  He states that he has never heard about the diagnosis of anemia in the past. He reports he took a couple of ibuprofen pills in the last week but not on a frequent basis. Does not take aspirin or blood thinners.  In the ED, he was tachycardic up to 108 bpm and tachypneic 28 breaths/min, blood pressure was stable, he was febrile up to 101.6.  Labs were remarkable for hemoglobin of 7.3 (baseline 15.3 in 2020), MCV 75, white blood cell count 9.7, platelets 545, CMP with normal BUN 18, creatinine 0.9, mild  hyponatremia of 131 and low potassium 3.3, rest of electrolytes within normal limits, AST mildly elevated 53 and ALT 35, rest of liver enzymes within normal limits, CRP 17, lactic acid 2.0, iron 8 low, ferritin 59 normal.  Patient had positive FOBT.  Chest x-ray is positive for bilateral infiltrates consistent with pneumonia.  FHx: neg for any gastrointestinal/liver disease, no malignancies Social: neg smoking, alcohol or illicit drug use Surgical: non contributory  Last EGD: Never Last Colonoscopy: Never  Past Medical History: SEE CHRONIC ISSSUES:History reviewed. No pertinent past medical history. Past Surgical History:  Past Surgical History:  Procedure Laterality Date  . ANKLE SURGERY     2010   Family History: No family history on file. Social History:  Social History   Tobacco Use  . Smoking status: Never Smoker  . Smokeless tobacco: Never Used  Substance Use Topics  . Alcohol use: Never  . Drug use: Never    Home Medications:  Prior to Admission medications   Medication Sig Start Date End Date Taking? Authorizing Provider  Multiple Vitamins-Minerals (ICAPS PO) Take 1 tablet by mouth daily.    Yes [provider]  Multiple Vitamins-Minerals (MULTIVITAMIN WITH MINERALS) tablet Take 1 tablet by mouth daily.   Yes [provider]  Multiple Vitamins-Minerals (ZINC PO) Take 1 tablet by mouth daily.    Yes [provider]  ciprofloxacin (CIPRO)  500 MG tablet Take 1 tablet (500 mg total) by mouth 2 (two) times daily. Patient not taking: Reported on 09/29/2019 08/15/18   Nat Christen, MD  tamsulosin (FLOMAX) 0.4 MG CAPS capsule Take 1 capsule (0.4 mg total) by mouth daily. Patient not taking: Reported on 09/29/2019 08/15/18   Nat Christen, MD    Inpatient Medications:  Current Facility-Administered Medications:  .  0.9 %  sodium chloride infusion (Manually program via Guardrails IV Fluids), , Intravenous, Once, Lang Snow, FNP .  albuterol (VENTOLIN HFA)  108 (90 Base) MCG/ACT inhaler 2 puff, 2 puff, Inhalation, Q6H, Adefeso, Oladapo, DO, 2 puff at 09/29/19 1412 .  ascorbic acid (VITAMIN C) tablet 500 mg, 500 mg, Oral, Daily, Adefeso, Oladapo, DO, 500 mg at 09/29/19 0950 .  chlorpheniramine-HYDROcodone (TUSSIONEX) 10-8 MG/5ML suspension 5 mL, 5 mL, Oral, Q12H PRN, Adefeso, Oladapo, DO .  diltiazem (CARDIZEM) tablet 30 mg, 30 mg, Oral, Q6H, Emokpae, Courage, MD, 30 mg at 09/29/19 1045 .  guaiFENesin-dextromethorphan (ROBITUSSIN DM) 100-10 MG/5ML syrup 10 mL, 10 mL, Oral, Q4H PRN, Adefeso, Oladapo, DO, 10 mL at 09/29/19 0050 .  methylPREDNISolone sodium succinate (SOLU-MEDROL) 125 mg/2 mL injection 60 mg, 60 mg, Intravenous, Q12H, Emokpae, Courage, MD, 60 mg at 09/29/19 0813 .  metoprolol tartrate (LOPRESSOR) tablet 25 mg, 25 mg, Oral, TID, Emokpae, Courage, MD .  pantoprazole (PROTONIX) 80 mg in sodium chloride 0.9 % 100 mL (0.8 mg/mL) infusion, 8 mg/hr, Intravenous, Continuous, Adefeso, Oladapo, DO, Last Rate: 10 mL/hr at 09/29/19 1503, 8 mg/hr at 09/29/19 1503 .  [START ON 10/02/2019] pantoprazole (PROTONIX) injection 40 mg, 40 mg, Intravenous, Q12H, Adefeso, Oladapo, DO .  [COMPLETED] remdesivir 100 mg in sodium chloride 0.9 % 100 mL IVPB, 100 mg, Intravenous, Q30 min, Stopped at 09/29/19 1145 **FOLLOWED BY** [START ON 09/30/2019] remdesivir 100 mg in sodium chloride 0.9 % 100 mL IVPB, 100 mg, Intravenous, Daily, Adefeso, Oladapo, DO .  zinc sulfate capsule 220 mg, 220 mg, Oral, Daily, Adefeso, Oladapo, DO, 220 mg at 09/29/19 0950  Current Outpatient Medications:  Marland Kitchen  Multiple Vitamins-Minerals (ICAPS PO), Take 1 tablet by mouth daily. , Disp: , Rfl:  .  Multiple Vitamins-Minerals (MULTIVITAMIN WITH MINERALS) tablet, Take 1 tablet by mouth daily., Disp: , Rfl:  .  Multiple Vitamins-Minerals (ZINC PO), Take 1 tablet by mouth daily. , Disp: , Rfl:  .  ciprofloxacin (CIPRO) 500 MG tablet, Take 1 tablet (500 mg total) by mouth 2 (two) times daily. (Patient  not taking: Reported on 09/29/2019), Disp: 20 tablet, Rfl: 0 .  tamsulosin (FLOMAX) 0.4 MG CAPS capsule, Take 1 capsule (0.4 mg total) by mouth daily. (Patient not taking: Reported on 09/29/2019), Disp: 10 capsule, Rfl: 0 Allergies: Patient has no known allergies.  Complete Review of Systems: GENERAL: negative for malaise, night sweats HEENT: No changes in hearing or vision, no nose bleeds or other nasal problems. NECK: Negative for lumps, goiter, pain and significant neck swelling RESPIRATORY: Negative for cough, wheezing CARDIOVASCULAR: Negative for chest pain, leg swelling, palpitations, orthopnea GI: SEE HPI MUSCULOSKELETAL: Negative for joint pain or swelling, back pain, and muscle pain. SKIN: Negative for lesions, rash PSYCH: Negative for sleep disturbance, mood disorder and recent psychosocial stressors. HEMATOLOGY Negative for prolonged bleeding, bruising easily, and swollen nodes. ENDOCRINE: Negative for cold or heat intolerance, polyuria, polydipsia and goiter. NEURO: negative for tremor, gait imbalance, syncope and seizures. The remainder of the review of systems is noncontributory.  Physical Exam: BP 114/77   Pulse 97   Temp 97.8  F (36.6 C) (Oral)   Resp (!) 23   Ht 6' (1.829 m)   Wt 107.5 kg   SpO2 94%   BMI 32.14 kg/m  Not performed as this was a telephone consultation.  Laboratory Data CBC:     Component Value Date/Time   WBC 4.8 09/29/2019 0950   RBC 3.27 (L) 09/29/2019 0950   HGB 7.0 (L) 09/29/2019 0950   HCT 24.6 (L) 09/29/2019 0950   PLT 453 (H) 09/29/2019 0950   MCV 75.2 (L) 09/29/2019 0950   MCH 21.4 (L) 09/29/2019 0950   MCHC 28.5 (L) 09/29/2019 0950   RDW 15.7 (H) 09/29/2019 0950   LYMPHSABS 0.3 (L) 09/29/2019 0950   MONOABS 0.1 09/29/2019 0950   EOSABS 0.0 09/29/2019 0950   BASOSABS 0.0 09/29/2019 0950   COAG: No results found for: INR, PROTIME  BMP:  BMP Latest Ref Rng & Units 09/29/2019 09/28/2019 09/24/2019  Glucose 70 - 99 mg/dL 236(H) 198(H)  165(H)  BUN 8 - 23 mg/dL 18 18 23   Creatinine 0.61 - 1.24 mg/dL 0.73 0.90 1.01  Sodium 135 - 145 mmol/L 133(L) 131(L) 131(L)  Potassium 3.5 - 5.1 mmol/L 4.8 3.3(L) 3.6  Chloride 98 - 111 mmol/L 94(L) 91(L) 93(L)  CO2 22 - 32 mmol/L 27 28 25   Calcium 8.9 - 10.3 mg/dL 7.9(L) 8.0(L) 8.5(L)    HEPATIC:  Hepatic Function Latest Ref Rng & Units 09/29/2019 09/28/2019 08/15/2018  Total Protein 6.5 - 8.1 g/dL 5.7(L) 6.7 8.0  Albumin 3.5 - 5.0 g/dL 2.3(L) 2.8(L) 4.1  AST 15 - 41 U/L 59(H) 53(H) 17  ALT 0 - 44 U/L 35 35 23  Alk Phosphatase 38 - 126 U/L 48 55 74  Total Bilirubin 0.3 - 1.2 mg/dL 0.9 0.4 1.0    CARDIAC: No results found for: CKTOTAL, CKMB, CKMBINDEX, TROPONINI   Imaging: I personally reviewed and interpreted the available imaging.  Assessment & Plan: Stephen Wilcox is a 64 y.o. male with history of recent COVID 19 infection, who came to the hospital for evaluation of worsening shortness of breath, fatigue and lightheadedness.  Gastroenterology was consulted due to severe iron deficiency anemia.  The patient was found to have findings consistent with moderately severe Covid infection.  He has not presented any evidence of active gastrointestinal bleeding as his BUN has been normal and he did not have any clinical symptoms of gastrointestinal bleeding.  In fact he has not had a bowel movement in 2 days which is not typical for an active gastrointestinal bleeding episode.  He has evidence of iron deficiency as his iron is decreased, his ferritin could be falsely normal as he is currently in a proinflammatory state.  At this moment, he should be continued on a PPI twice daily but no need for an endoscopic intervention is warranted at this time.  Ultimately, he will need to undergo an EGD and a colonoscopy which she has never had in the past to evaluate the source of his iron deficiency anemia.  If he has any evidence of bleeding clinically, we will consider performing an EGD or colonoscopy  during this hospitalization.  Patient understood and agreed.  He has mild elevation of his liver function tests, which could be related to his Covid status.  LFTs should be trended on a daily basis and consideration to rule out viral, metabolic or autoimmune etiologies should be considered if they persist elevated or worsen.  # Iron deficiency anemia # Elevated aminotransferases - Repeat CBC qday, transfuse if  Hb <7 - Pantoprazole 40 mg q12h IVP - 2 large bore IV lines - Active T/S - Continue regular diet for now - Avoid NSAIDs - No need for endoscopic evaluation at this point, patient will require an outpatient EGD and colonoscopy -Daily CMP  Harvel Quale, MD Gastroenterology and Hepatology [facility]   Note: Occasional unusual wording and randomly placed punctuation marks may result from the use of speech recognition technology to transcribe this document

## 2019-09-29 NOTE — ED Notes (Signed)
Blood transfusion started at 1528. Error with scanning. Blood bank made aware. Instructed to run as downtime. Verified by Fabio Neighbors, RN

## 2019-09-29 NOTE — ED Notes (Addendum)
15 minutes post transfusion start, no rxn noted. VSS at this time. Rate increased to 160ml/hr

## 2019-09-29 NOTE — ED Notes (Signed)
Pt no visible distress.  Denies any pain.  Pale in color.   Heart rate 140's on the monitor and pt appears to be in atrial fib.  EKG done and sent message to attending doctor.  Pt asymptomatic with the atrial fib.

## 2019-09-29 NOTE — ED Notes (Signed)
cbg [place] aware

## 2019-09-29 NOTE — H&P (Signed)
History and Physical  MADDEN GARRON PXT:[id number] DOB: [birth year] DOA: 09/28/2019  Referring physician: Maudie Flakes, MD PCP: Jake Samples, PA-C  Patient coming from: Home  Chief Complaint: Increased weakness in the setting of COVID-19 virus infection  HPI: Stephen Wilcox is a 64 y.o. male with no pertinent medical history who presents to the emergency department due to increased weakness in the setting of COVID-19 virus infection.  Patient initially presented to the ED on 09/24/2019 due to 4-day.history of low energy, lightheadedness and fatigue, with no cough or shortness of breath.  Wife was hospitalized with Covid at that time.  Patient appeared to have declined use of monoclonal antibody infusion due to mild to moderate Covid symptoms he had at that time per medical record.  He now presents to the emergency department with worsening weakness, dizziness, cough (nonproductive) and decreased energy.  He denies loss of taste or smell, nausea, vomiting, abdominal pain, diarrhea or constipation.  Patient did not get any of the Covid vaccines  ED Course:  In the emergency department, he was noted to be febrile,, tachypneic and temporarily tachycardic.  O2 sat on arrival was 82%, he was placed on supplemental oxygen via [place] at 2 LPM with improvement of O2 sat to 94%.  Work-up in the ED showed microcytic anemia, thrombocytosis, hyponatremia, hypokalemia, hyperglycemia.  Lactic acid 2.0, D-dimer 1.89, fibrinogen 698, CRP 17.4, LDH 304.  Chest x-ray shows patchy bilateral airspace opacities, consistent with bilateral pneumonia.  Patient was started on Decadron and IV hydration was provided.  Hospitalist was asked (for further evaluation and management.  Review of Systems: Constitutional: Positive for generalized weakness, lack of energy and fever.  HENT: Negative for ear pain and sore throat.   Eyes: Negative for pain and visual disturbance.  Respiratory: Positive for cough and shortness  of breath.   Cardiovascular: Negative for chest pain and palpitations.  Gastrointestinal: Negative for abdominal pain and vomiting.  Endocrine: Negative for polyphagia and polyuria.  Genitourinary: Negative for decreased urine volume, dysuria, enuresis Musculoskeletal: Negative for arthralgias and back pain.  Skin: Negative for color change and rash.  Allergic/Immunologic: Negative for immunocompromised state.  Neurological: Negative for tremors, syncope, speech difficulty, weakness, light-headedness and headaches.  Hematological: Does not bruise/bleed easily.  All other systems reviewed and are negative    History reviewed. No pertinent past medical history. Past Surgical History:  Procedure Laterality Date  . ANKLE SURGERY     2010    Social History:  reports that he has never smoked. He has never used smokeless tobacco. He reports that he does not drink alcohol and does not use drugs.   No Known Allergies  No family history on file.    Prior to Admission medications   Medication Sig Start Date End Date Taking? Authorizing Provider  ciprofloxacin (CIPRO) 500 MG tablet Take 1 tablet (500 mg total) by mouth 2 (two) times daily. 08/15/18   Nat Christen, MD  Multiple Vitamins-Minerals (ICAPS PO) Take 1 tablet by mouth daily.     [provider]  Multiple Vitamins-Minerals (MULTIVITAMIN WITH MINERALS) tablet Take 1 tablet by mouth daily.    [provider]  Multiple Vitamins-Minerals (ZINC PO) Take 1 tablet by mouth daily.     [provider]  tamsulosin (FLOMAX) 0.4 MG CAPS capsule Take 1 capsule (0.4 mg total) by mouth daily. 08/15/18   Nat Christen, MD    Physical Exam: BP 118/63   Pulse 97   Temp (!) 101.6 F (  38.7 C) (Oral)   Resp (!) 26   Ht 6' (1.829 m)   Wt 107.5 kg   SpO2 94%   BMI 32.14 kg/m   . General: 64 y.o. year-old male well developed well nourished in no acute distress.  Alert and oriented x3. Marland Kitchen HEENT: NCAT, EOMI . Neck: Supple,  trachea medial . Cardiovascular: Tachycardia.  Regular rate and rhythm with no rubs or gallops.  No thyromegaly or JVD noted.  No lower extremity edema. 2/4 pulses in all 4 extremities. Marland Kitchen Respiratory: Tachypnea.  Clear to auscultation with no wheezes or rales. . Abdomen: Soft nontender nondistended with normal bowel sounds x4 quadrants. . Muskuloskeletal: No cyanosis, clubbing or edema noted bilaterally . Neuro: CN II-XII intact, strength, sensation, reflexes . Skin: No ulcerative lesions noted or rashes . Psychiatry: Judgement and insight appear normal. Mood is appropriate for condition and setting          Labs on Admission:  Basic Metabolic Panel: Recent Labs  Lab 09/24/19 1208 09/28/19 2239  NA 131* 131*  K 3.6 3.3*  CL 93* 91*  CO2 25 28  GLUCOSE 165* 198*  BUN 23 18  CREATININE 1.01 0.90  CALCIUM 8.5* 8.0*   Liver Function Tests: Recent Labs  Lab 09/28/19 2239  AST 53*  ALT 35  ALKPHOS 55  BILITOT 0.4  PROT 6.7  ALBUMIN 2.8*   No results for input(s): LIPASE, AMYLASE in the last 168 hours. No results for input(s): AMMONIA in the last 168 hours. CBC: Recent Labs  Lab 09/24/19 1208 09/28/19 2239  WBC 9.3 9.7  NEUTROABS  --  9.1*  HGB 9.1* 7.3*  HCT 31.9* 25.5*  MCV 75.6* 75.4*  PLT 576* 545*   Cardiac Enzymes: No results for input(s): CKTOTAL, CKMB, CKMBINDEX, TROPONINI in the last 168 hours.  BNP (last 3 results) No results for input(s): BNP in the last 8760 hours.  ProBNP (last 3 results) No results for input(s): PROBNP in the last 8760 hours.  CBG: No results for input(s): GLUCAP in the last 168 hours.  Radiological Exams on Admission: DG Chest Port 1 View  Result Date: 09/28/2019 CLINICAL DATA:  COVID EXAM: PORTABLE CHEST 1 VIEW COMPARISON:  09/24/2019 FINDINGS: Development of patchy bilateral airspace opacities. No pleural effusion. Stable cardiomediastinal silhouette. No pneumothorax. IMPRESSION: Development of patchy bilateral airspace  opacities, consistent with bilateral pneumonia. Electronically Signed   By: Donavan Foil M.D.   On: 09/28/2019 23:14    EKG: I independently viewed the EKG done and my findings are as followed: No EKG was done in the ED  Assessment/Plan Present on Admission: . Pneumonia due to COVID-19 virus  Active Problems:   Pneumonia due to COVID-19 virus   Acute respiratory failure with hypoxia (HCC)   Sepsis (HCC)   Hyperglycemia   Thrombocytosis (HCC)   Hyponatremia   Hypokalemia   Microcytic anemia   GI bleed   Symptomatic anemia  Acute respiratory failure possibly secondary to severe sepsis due to COVID-19 virus pneumonia Patient presents with fever, tachypnea and tachycardia (meet SIRS criteria), chest x-ray showed bilateral pneumonia as source of infection, thereby meeting sepsis criteria.  Lactic acid was 2.0. SARS coronavirus 2 done on 09/24/2019 was positive Continue albuterol q.6h Continue IV Decadron 6 mg daily Continue IV Remdesivir per pharmacy protocol Continue vitamin-C 500 mg p.o. Daily Continue zinc 220 mg p.o. Daily Continue Robitussin and Tussionex Continue Tylenol p.r.n. for fever Continue supplemental oxygen to maintain O2 sat > or = 94% with plan  to wean patient off supplemental oxygen as tolerated (of note, patient does not use oxygen at baseline) Continue incentive spirometry and flutter valve q65min as tolerated Encourage proning, early ambulation, and side laying as tolerated Continue airborne isolation precaution Inflammatory markers: LDH: 304 CRP: 17.4 D-dimer: 1.89 Ferritin: 59 Continue monitoring daily inflammatory markers Physician PPE:  Surgical mask with face shield, N-95, nonsterile gloves, disposable gown, head and shoe cover s Patient PPE:  Face mask   Lactic acidosis in the setting of above Lactic acid 2.0, continue to trend lactic acid  Acute GI bleed /Symptomatic anemia H/H= 7.3/25.5 this was 9.1/31.9  on 09/24/2019 Hemoccult was positive Type  and crossmatch was done in the ED with plan to start blood transfusion in the ED Continue IV Protonix drip Gastroenterologist  will be consulted in the morning  Hyperglycemia with no known history of type 2 diabetes mellitus Blood glucose level 198; hemoglobin A1c will be checked Consider sliding scale insulin if blood glucose level continues to stay elevated  Hyponatremia/hypokalemia possibly due to COVID-19 virus pneumonia Corrected sodium level due to hyperglycemia = 133 K+= 3.3; K+ will be replenished Continue to monitor sodium and potassium level with morning labs  Microcytic anemia MCV 75.4, H/H 7.3/25.5 Iron studies will be done  Thrombocytosis possibly reactive Platelets 545, continue to monitor platelet levels   DVT prophylaxis: DVT (no chemoprophylaxis at this time due to positive Hemoccult)  Code Status: Full code  Family Communication: None at bedside  Disposition Plan:  Patient is from:                        home Anticipated DC to:                   SNF or family members home Anticipated DC date:               2-3 days Anticipated DC barriers:         Unstable to discharge at this time due to acute respiratory failure with hypoxia secondary to sepsis due to COVID-19 pneumonia requiring inpatient IV treatment   Consults called: Gastroenterologist  Admission status: Inpatient  Bernadette Hoit MD [facility]  If 7PM-7AM, please contact night-coverage [email or web address] Password [email or web address]  09/29/2019, 2:07 AM

## 2019-09-29 NOTE — Progress Notes (Signed)
Nurse called due to blood culture being positive for gram-positive cocci in anaerobic bottle.   Staph epidermidis and MRSA were detected.  Patient will be tentatively started on IV vancomycin. Echocardiogram will be checked to rule out possible infective endocarditis

## 2019-09-29 NOTE — ED Notes (Addendum)
Date and time results received: 09/29/19 2205 (use smartphrase ".now" to insert current time)  Test: Blood Culture Critical Value: Gram Positive Cocci in Anaerobic Bottle  Name of Provider Notified: Adefeso  Orders Received? Or Actions Taken?:

## 2019-09-29 NOTE — ED Notes (Signed)
Spoke with Dr. Denton Brick and notified of blood pressure.  Ok to give cardizem now.  And later on if blood pressure comes up - we can give the beta blocker.  Plan transfer to [facility].

## 2019-09-29 NOTE — Progress Notes (Signed)
Patient Demographics:    Stephen Wilcox, is a 64 y.o. male, DOB - 1955-07-31, WHQ:[id number]  Admit date - 09/28/2019   Admitting Physician Evelyn Moch Denton Brick, MD  Outpatient Primary MD for the patient is Scherrie Bateman  LOS - 1   Chief Complaint  Patient presents with  . Covid Positive        Subjective:    [name] today has no emesis,  No chest pain,   -Shortness of breath, cough and hypoxia persists -No BM in last couple of days   Assessment  & Plan :    Active Problems:   Pneumonia due to COVID-19 virus   Acute respiratory failure with hypoxia (HCC)   Sepsis (HCC)   Hyperglycemia   Thrombocytosis (HCC)   Hyponatremia   Hypokalemia   Microcytic anemia   GI bleed   Symptomatic anemia   Lactic acidosis   Acute respiratory disease due to COVID-19 virus  Brief Summary:-  64 y.o. male with no pertinent medical history who presents to the emergency department due to increased weakness in the setting of COVID-19 virus infection -Patient is unvaccinated against COVID-19 virus -Patient tested positive on 09/24/2019 -He was offered outpatient monoclonal antibody infusion on 09/25/2019 which he declined--- please see documentation from nurse practitioner Faythe Casa on 09/25/19  -Patient readmitted on 09/28/2019 with worsening symptoms respiratory symptoms with hypoxia and chest x-ray consistent with Covid pneumonia   and also found to have heme positive stools and symptomatic anemia as well   A/p 1)Acute hypoxic respiratory failure secondary to COVID-19 infection/Pneumonia--- The treatment plan and use of medications  for treatment of COVID-19 infection and possible side effects were discussed with patient/family -----Patient/Family verbalizes understanding and agrees to treatment protocols  --Patient is positive for COVID-19 infection, chest x-ray with findings of  infiltrates/opacities,  patient is tachypneic/hypoxic and requiring continuous supplemental oxygen---patient meets criteria for initiation of Remdesivir AND Steroid therapy per protocol  D-Dimer 1.89 >> 1.48 Ferritin 59>>70 CRP 17.4 >> 15.8 -Fibrinogen is 698 PCT 0.17 LDH 304 --Check and trend inflammatory markers including D-dimer, ferritin and  CRP---also follow CBC and CMP --Supplemental oxygen to keep O2 sats above 93% -Follow serial chest x-rays and ABGs as indicated --- Encourage prone positioning for More than 16 hours/day in increments of 2 to 3 hours at a time if able to tolerate --Attempt to maintain euvolemic state --Zinc and vitamin C as ordered -Albuterol inhaler as needed -Accu-Cheks/fingersticks while on high-dose steroids -PPI while on high-dose steroids -Continue IV remdesivir and IV steroids started on 09/29/2019    2) acute GI bleed--GI consult appreciated, continue IV Protonix, patient received 2 units of PRBC -Patient admits to recent NSAID use on a regular basis due to COVID-19 type myalgias -Outpatient endoluminal evaluation advised unless patient decompensates while inpatient  3) presumed symptomatic acute blood loss anemia/iron deficiency anemia --- hemoglobin down to 7.0 from 9.1 on 09/24/19 -Ferritin is not low most likely due to COVID-19 infection -Serum iron is 8 with iron saturation of 3 --Patient denies any history of anemia until recently -Transfuse as above #2 -Endoluminal evaluation as noted above #2  4) history of BPH--- resume Flomax  5) class II obesity-this increases overall morbidity and mortality -Low calorie diet, portion  control and increase physical activity discussed with patient after resolution of acute respiratory symptoms -Body mass index is 32.14 kg/m.  Disposition/Need for in-Hospital Stay- patient unable to be discharged at this time due to acute hypoxic respiratory failure secondary to Covid pneumonia requiring IV remdesivir and IV  steroids and supplemental oxygen as well as GI bleed with drop in H&H requiring transfusion and close monitoring  Status is: Inpatient  Remains inpatient appropriate because:acute hypoxic respiratory failure secondary to Covid pneumonia requiring IV remdesivir and IV steroids and supplemental oxygen as well as GI bleed with drop in H&H requiring transfusion and close monitoring   Disposition: The patient is from: Home              Anticipated d/c is to: Home              Anticipated d/c date is: > 3 days              Patient currently is not medically stable to d/c. Barriers: Not Clinically Stable- acute hypoxic respiratory failure secondary to Covid pneumonia requiring IV remdesivir and IV steroids and supplemental oxygen as well as GI bleed with drop in H&H requiring transfusion and close monitoring  Code Status : full code  Family Communication:    (patient is alert, awake and coherent)   Consults  :  Gi   DVT Prophylaxis  :    - SCDs /GI bleed  Lab Results  Component Value Date   PLT 453 (H) 09/29/2019    Inpatient Medications  Scheduled Meds: . sodium chloride   Intravenous Once  . albuterol  2 puff Inhalation Q6H  . vitamin C  500 mg Oral Daily  . diltiazem  30 mg Oral Q6H  . methylPREDNISolone (SOLU-MEDROL) injection  60 mg Intravenous Q12H  . metoprolol tartrate  25 mg Oral TID  . [START ON 10/02/2019] pantoprazole  40 mg Intravenous Q12H  . zinc sulfate  220 mg Oral Daily   Continuous Infusions: . pantoprozole (PROTONIX) infusion 8 mg/hr (09/29/19 1503)  . [START ON 09/30/2019] remdesivir 100 mg in NS 100 mL     PRN Meds:.chlorpheniramine-HYDROcodone, guaiFENesin-dextromethorphan    Anti-infectives (From admission, onward)   Start     Dose/Rate Route Frequency Ordered Stop   09/30/19 1000  remdesivir 100 mg in sodium chloride 0.9 % 100 mL IVPB       "Followed by" Linked Group Details   100 mg 200 mL/hr over 30 Minutes Intravenous Daily 09/28/19 2358 10/04/19  0959   09/29/19 1000  remdesivir 100 mg in sodium chloride 0.9 % 100 mL IVPB  Status:  Discontinued       "Followed by" Linked Group Details   100 mg 200 mL/hr over 30 Minutes Intravenous Daily 09/28/19 2353 09/28/19 2358   09/29/19 0000  remdesivir 200 mg in sodium chloride 0.9% 250 mL IVPB  Status:  Discontinued       "Followed by" Linked Group Details   200 mg 580 mL/hr over 30 Minutes Intravenous Once 09/28/19 2353 09/28/19 2358   09/29/19 0000  remdesivir 100 mg in sodium chloride 0.9 % 100 mL IVPB       "Followed by" Linked Group Details   100 mg 200 mL/hr over 30 Minutes Intravenous Every 30 min 09/28/19 2358 09/29/19 1145        Objective:   Vitals:   09/29/19 1600 09/29/19 1630 09/29/19 1700 09/29/19 1733  BP: 98/70 103/69 114/77 107/77  Pulse: (!) 102 99 97 92  Resp: (!) 23 (!) 23 (!) 23 (!) 21  Temp:    97.9 F (36.6 C)  TempSrc:    Oral  SpO2: 93% 93% 94% 94%  Weight:      Height:        Wt Readings from Last 3 Encounters:  09/28/19 107.5 kg  09/24/19 107.5 kg  08/15/18 108.9 kg     Intake/Output Summary (Last 24 hours) at 09/29/2019 1813 Last data filed at 09/29/2019 1145 Gross per 24 hour  Intake 200 ml  Output --  Net 200 ml   Physical Exam  Gen:- Awake Alert, some conversational dyspnea HEENT:- [place].AT, No sclera icterus Neck-Supple Neck,No JVD,.  Lungs-diminished breath sounds with scattered wheezes bilaterally  CV- S1, S2 normal, regular  Abd-  +ve B.Sounds, Abd Soft, No tenderness,    Extremity/Skin:- No  edema, pedal pulses present Psych-affect is appropriate, oriented x3 Neuro-generalized weakness ,no new focal deficits, no tremors   Data Review:   Micro Results Recent Results (from the past 240 hour(s))  SARS Coronavirus 2 by RT PCR (hospital order, performed in [facility] hospital lab) Nasopharyngeal Nasopharyngeal Swab     Status: Abnormal   Collection Time: 09/24/19 11:24 AM   Specimen: Nasopharyngeal Swab  Result Value Ref Range  Status   SARS Coronavirus 2 POSITIVE (A) NEGATIVE Final    Comment: RESULT CALLED TO, READ BACK BY AND VERIFIED WITH: SANDRA RN @ 0102 ON [id number] BY HENDERSON L (NOTE) SARS-CoV-2 target nucleic acids are DETECTED  SARS-CoV-2 RNA is generally detectable in upper respiratory specimens  during the acute phase of infection.  Positive results are indicative  of the presence of the identified virus, but do not rule out bacterial infection or co-infection with other pathogens not detected by the test.  Clinical correlation with patient history and  other diagnostic information is necessary to determine patient infection status.  The expected result is negative.  Fact Sheet for Patients:   StrictlyIdeas.no   Fact Sheet for Healthcare Providers:   BankingDealers.co.za    This test is not yet approved or cleared by the [place] FDA and  has been authorized for detection and/or diagnosis of SARS-CoV-2 by FDA under an Emergency Use Authorization (EUA).  This EUA will remain in effect (meaning  this test can be used) for the duration of  the COVID-19 declaration under Section 564(b)(1) of the Act, 21 U.S.C. section 360-bbb-3(b)(1), unless the authorization is terminated or revoked sooner.  Performed at [facility], [address] [place], [place] [address]   Blood Culture (routine x 2)     Status: None (Preliminary result)   Collection Time: 09/28/19 11:19 PM   Specimen: BLOOD  Result Value Ref Range Status   Specimen Description BLOOD LEFT ANTECUBITAL  Final   Special Requests   Final    BOTTLES DRAWN AEROBIC AND ANAEROBIC Blood Culture adequate volume   Culture   Final    NO GROWTH < 24 HOURS Performed at [facility], [address], [place], [place] [address]    Report Status PENDING  Incomplete  Blood Culture (routine x 2)     Status: None (Preliminary result)   Collection Time: 09/28/19 11:34 PM   Specimen: BLOOD  Result Value  Ref Range Status   Specimen Description BLOOD RIGHT ANTECUBITAL  Final   Special Requests   Final    BOTTLES DRAWN AEROBIC AND ANAEROBIC Blood Culture adequate volume   Culture   Final    NO GROWTH < 24 HOURS  Performed at [facility], [address], [place], [place] [address]    Report Status PENDING  Incomplete    Radiology Reports DG Chest 2 View  Result Date: 09/15/2019 CLINICAL DATA:  Acute chest pain. EXAM: CHEST - 2 VIEW COMPARISON:  None. FINDINGS: The heart size and mediastinal contours are within normal limits. No pneumothorax or pleural effusion is noted. Right lung is clear. Minimal left basilar subsegmental atelectasis or scarring is noted. The visualized skeletal structures are unremarkable. IMPRESSION: Minimal left basilar subsegmental atelectasis or scarring. Electronically Signed   By: Marijo Conception M.D.   On: 09/15/2019 16:58   DG Ribs Unilateral Right  Result Date: 09/15/2019 CLINICAL DATA:  Acute right chest pain. EXAM: RIGHT RIBS - 2 VIEW COMPARISON:  None. FINDINGS: No fracture or other bone lesions are seen involving the ribs. IMPRESSION: Negative. Electronically Signed   By: Marijo Conception M.D.   On: 09/15/2019 16:57   DG Chest Port 1 View  Result Date: 09/28/2019 CLINICAL DATA:  COVID EXAM: PORTABLE CHEST 1 VIEW COMPARISON:  09/24/2019 FINDINGS: Development of patchy bilateral airspace opacities. No pleural effusion. Stable cardiomediastinal silhouette. No pneumothorax. IMPRESSION: Development of patchy bilateral airspace opacities, consistent with bilateral pneumonia. Electronically Signed   By: Donavan Foil M.D.   On: 09/28/2019 23:14   DG Chest Port 1 View  Result Date: 09/24/2019 CLINICAL DATA:  COVID pneumonia, weakness, fatigue EXAM: PORTABLE CHEST 1 VIEW COMPARISON:  09/15/2019 FINDINGS: The lungs are symmetrically expanded. Minimal left basilar atelectasis is again noted. No superimposed confluent pulmonary infiltrate. No pneumothorax or pleural effusion.  Cardiac size within normal limits. The pulmonary vascularity is normal. No acute bone abnormality. IMPRESSION: Minimal left basilar atelectasis, unchanged. No superimposed confluent pulmonary infiltrate. Electronically Signed   By: Fidela Salisbury MD   On: 09/24/2019 20:15     CBC Recent Labs  Lab 09/24/19 1208 09/28/19 2239 09/29/19 0950  WBC 9.3 9.7 4.8  HGB 9.1* 7.3* 7.0*  HCT 31.9* 25.5* 24.6*  PLT 576* 545* 453*  MCV 75.6* 75.4* 75.2*  MCH 21.6* 21.6* 21.4*  MCHC 28.5* 28.6* 28.5*  RDW 15.4 15.9* 15.7*  LYMPHSABS  --  0.2* 0.3*  MONOABS  --  0.3 0.1  EOSABS  --  0.0 0.0  BASOSABS  --  0.0 0.0    Chemistries  Recent Labs  Lab 09/24/19 1208 09/28/19 2239 09/29/19 0950  NA 131* 131* 133*  K 3.6 3.3* 4.8  CL 93* 91* 94*  CO2 25 28 27   GLUCOSE 165* 198* 236*  BUN 23 18 18   CREATININE 1.01 0.90 0.73  CALCIUM 8.5* 8.0* 7.9*  MG  --   --  2.4  AST  --  53* 59*  ALT  --  35 35  ALKPHOS  --  55 48  BILITOT  --  0.4 0.9   ------------------------------------------------------------------------------------------------------------------ Recent Labs    09/28/19 2239  TRIG 49    Lab Results  Component Value Date   HGBA1C 6.9 (H) 09/28/2019   ------------------------------------------------------------------------------------------------------------------ No results for input(s): TSH, T4TOTAL, T3FREE, THYROIDAB in the last 72 hours.  Invalid input(s): FREET3 ------------------------------------------------------------------------------------------------------------------ Recent Labs    09/28/19 2239 09/28/19 2334 09/29/19 0950  FERRITIN 59  --  70  TIBC  --  257  --   IRON  --  8*  --     Coagulation profile No results for input(s): INR, PROTIME in the last 168 hours.  Recent Labs    09/28/19 2239 09/29/19 0950  DDIMER 1.89* 1.48*    Cardiac Enzymes No results for input(s): CKMB, TROPONINI, MYOGLOBIN in the last 168 hours.  Invalid input(s):  CK ------------------------------------------------------------------------------------------------------------------ No results found for: BNP   Roxan Hockey M.D on 09/29/2019 at 6:13 PM  Go to [email or web address] - for contact info  [facility] - Office  [phone]

## 2019-09-29 NOTE — ED Notes (Signed)
2nd unit of blood completed. VSS at this time.

## 2019-09-30 ENCOUNTER — Encounter (HOSPITAL_COMMUNITY): Payer: Self-pay | Admitting: Family Medicine

## 2019-09-30 ENCOUNTER — Inpatient Hospital Stay (HOSPITAL_COMMUNITY): Payer: 59

## 2019-09-30 ENCOUNTER — Ambulatory Visit: Payer: Self-pay | Admitting: Urology

## 2019-09-30 DIAGNOSIS — R160 Hepatomegaly, not elsewhere classified: Secondary | ICD-10-CM

## 2019-09-30 DIAGNOSIS — J069 Acute upper respiratory infection, unspecified: Secondary | ICD-10-CM

## 2019-09-30 DIAGNOSIS — K922 Gastrointestinal hemorrhage, unspecified: Secondary | ICD-10-CM

## 2019-09-30 DIAGNOSIS — A419 Sepsis, unspecified organism: Secondary | ICD-10-CM

## 2019-09-30 DIAGNOSIS — J1282 Pneumonia due to coronavirus disease 2019: Secondary | ICD-10-CM

## 2019-09-30 DIAGNOSIS — C787 Secondary malignant neoplasm of liver and intrahepatic bile duct: Secondary | ICD-10-CM

## 2019-09-30 DIAGNOSIS — I38 Endocarditis, valve unspecified: Secondary | ICD-10-CM

## 2019-09-30 DIAGNOSIS — R6521 Severe sepsis with septic shock: Secondary | ICD-10-CM

## 2019-09-30 DIAGNOSIS — U071 COVID-19: Secondary | ICD-10-CM

## 2019-09-30 LAB — CBG MONITORING, ED
Glucose-Capillary: 289 mg/dL — ABNORMAL HIGH (ref 70–99)
Glucose-Capillary: 328 mg/dL — ABNORMAL HIGH (ref 70–99)
Glucose-Capillary: 370 mg/dL — ABNORMAL HIGH (ref 70–99)

## 2019-09-30 LAB — COMPREHENSIVE METABOLIC PANEL
ALT: 30 U/L (ref 0–44)
AST: 28 U/L (ref 15–41)
Albumin: 2.4 g/dL — ABNORMAL LOW (ref 3.5–5.0)
Alkaline Phosphatase: 46 U/L (ref 38–126)
Anion gap: 9 (ref 5–15)
BUN: 20 mg/dL (ref 8–23)
CO2: 29 mmol/L (ref 22–32)
Calcium: 8 mg/dL — ABNORMAL LOW (ref 8.9–10.3)
Chloride: 95 mmol/L — ABNORMAL LOW (ref 98–111)
Creatinine, Ser: 0.7 mg/dL (ref 0.61–1.24)
GFR calc Af Amer: >60 mL/min (ref >60)
GFR calc non Af Amer: >60 mL/min (ref >60)
Glucose, Bld: 291 mg/dL — ABNORMAL HIGH (ref 70–99)
Potassium: 3.7 mmol/L (ref 3.5–5.1)
Sodium: 133 mmol/L — ABNORMAL LOW (ref 135–145)
Total Bilirubin: 0.6 mg/dL (ref 0.3–1.2)
Total Protein: 5.9 g/dL — ABNORMAL LOW (ref 6.5–8.1)

## 2019-09-30 LAB — BLOOD CULTURE ID PANEL (REFLEXED) - BCID2

## 2019-09-30 LAB — CBC WITH DIFFERENTIAL/PLATELET
Abs Immature Granulocytes: 0.08 10*3/uL — ABNORMAL HIGH (ref 0.00–0.07)
Basophils Absolute: 0 10*3/uL (ref 0.0–0.1)
Basophils Relative: 0 %
Eosinophils Absolute: 0 10*3/uL (ref 0.0–0.5)
Eosinophils Relative: 0 %
HCT: 31 % — ABNORMAL LOW (ref 39.0–52.0)
Hemoglobin: 9.3 g/dL — ABNORMAL LOW (ref 13.0–17.0)
Immature Granulocytes: 1 %
Lymphocytes Relative: 3 %
Lymphs Abs: 0.4 10*3/uL — ABNORMAL LOW (ref 0.7–4.0)
MCH: 23 pg — ABNORMAL LOW (ref 26.0–34.0)
MCHC: 30 g/dL (ref 30.0–36.0)
MCV: 76.7 fL — ABNORMAL LOW (ref 80.0–100.0)
Monocytes Absolute: 0.5 10*3/uL (ref 0.1–1.0)
Monocytes Relative: 5 %
Neutro Abs: 9.5 10*3/uL — ABNORMAL HIGH (ref 1.7–7.7)
Neutrophils Relative %: 91 %
Platelets: 519 10*3/uL — ABNORMAL HIGH (ref 150–400)
RBC: 4.04 MIL/uL — ABNORMAL LOW (ref 4.22–5.81)
RDW: 16 % — ABNORMAL HIGH (ref 11.5–15.5)
WBC: 10.5 10*3/uL (ref 4.0–10.5)
nRBC: 0.4 % — ABNORMAL HIGH (ref 0.0–0.2)

## 2019-09-30 LAB — FERRITIN: Ferritin: 95 ng/mL (ref 24–336)

## 2019-09-30 LAB — BPAM RBC
Blood Product Expiration Date: 202110022359
Blood Product Expiration Date: 202110022359
ISSUE DATE / TIME: 202109091239
ISSUE DATE / TIME: 202109091239
Unit Type and Rh: 6200
Unit Type and Rh: 6200

## 2019-09-30 LAB — TYPE AND SCREEN
ABO/RH(D): A POS
Antibody Screen: POSITIVE
DAT, IgG: POSITIVE
Unit division: 0
Unit division: 0

## 2019-09-30 LAB — ECHOCARDIOGRAM COMPLETE
Area-P 1/2: 6.6 cm2
Height: 72 in
S' Lateral: 2.2 cm
Weight: 3792 oz

## 2019-09-30 LAB — C-REACTIVE PROTEIN: CRP: 7.9 mg/dL — ABNORMAL HIGH (ref <1.0)

## 2019-09-30 LAB — GLUCOSE, CAPILLARY: Glucose-Capillary: 346 mg/dL — ABNORMAL HIGH (ref 70–99)

## 2019-09-30 LAB — PHOSPHORUS: Phosphorus: 2.4 mg/dL — ABNORMAL LOW (ref 2.5–4.6)

## 2019-09-30 LAB — PROCALCITONIN: Procalcitonin: 0.16 ng/mL

## 2019-09-30 LAB — D-DIMER, QUANTITATIVE: D-Dimer, Quant: 1.26 ug/mL-FEU — ABNORMAL HIGH (ref 0.00–0.50)

## 2019-09-30 LAB — MAGNESIUM: Magnesium: 2.3 mg/dL (ref 1.7–2.4)

## 2019-09-30 MED ORDER — VANCOMYCIN HCL 2000 MG/400ML IV SOLN
2000.0000 mg | Freq: Once | INTRAVENOUS | Status: AC
  Administered 2019-09-30: 2000 mg via INTRAVENOUS
  Filled 2019-09-30: qty 400

## 2019-09-30 MED ORDER — VANCOMYCIN HCL IN DEXTROSE 1-5 GM/200ML-% IV SOLN
1000.0000 mg | Freq: Two times a day (BID) | INTRAVENOUS | Status: AC
  Administered 2019-09-30: 1000 mg via INTRAVENOUS
  Filled 2019-09-30: qty 200

## 2019-09-30 NOTE — Progress Notes (Signed)
MD notified of CBG greater than 300 and no Levemir ordered, per Diabetes Coordinator consideration 9/10

## 2019-09-30 NOTE — ED Notes (Signed)
Report to Anjail, RN  

## 2019-09-30 NOTE — Progress Notes (Signed)
*  PRELIMINARY RESULTS* Echocardiogram 2D Echocardiogram has been performed.  Leavy Cella 09/30/2019, 3:19 PM

## 2019-09-30 NOTE — Progress Notes (Signed)
Pharmacy Antibiotic Note  Stephen Wilcox is a 64 y.o. male admitted on 09/28/2019 with bacteremia.  Pharmacy has been consulted for Vancomycin dosing.  Plan: Vancomycin 2000mg  loading dose, then 1000mg  IV every 12 hours.  Goal trough 15-20 mcg/mL.  F/U cxs and clinical progress Monitor V/S, labs and levels as indicated  Height: 6' (182.9 cm) Weight: 107.5 kg (237 lb) IBW/kg (Calculated) : 77.6  Temp (24hrs), Avg:98.1 F (36.7 C), Min:97.7 F (36.5 C), Max:98.8 F (37.1 C)  Recent Labs  Lab 09/24/19 1208 09/28/19 2239 09/28/19 2334 09/29/19 0142 09/29/19 0950 09/30/19 0616  WBC 9.3 9.7  --   --  4.8 10.5  CREATININE 1.01 0.90  --   --  0.73 0.70  LATICACIDVEN  --   --  2.0* 1.7  --   --     Estimated Creatinine Clearance: 118.2 mL/min (by C-G formula based on SCr of 0.7 mg/dL).    No Known Allergies  Antimicrobials this admission: Vancomycin 9/10 >>   Microbiology results: 9/09 BCx: BCID is + CONS in 1 bottle 9/4 SARS-CV is positive Thank you for allowing pharmacy to be a part of this patient's care.  Isac Sarna, BS Pharm D, [place] Clinical Pharmacist Pager [phone] 09/30/2019 7:45 AM

## 2019-09-30 NOTE — Progress Notes (Addendum)
Patient Demographics:    Stephen Wilcox, is a 64 y.o. male, DOB - 12-15-1955, SWN:[id number]  Admit date - 09/28/2019   Admitting Physician Demaria Deeney Denton Brick, MD  Outpatient Primary MD for the patient is Scherrie Bateman  LOS - 2   Chief Complaint  Patient presents with  . Covid Positive        Subjective:    Stephen Wilcox today has no emesis,  No chest pain,   -Shortness of breath, cough and hypoxia persists -No further fevers   Assessment  & Plan :    Active Problems:   Pneumonia due to COVID-19 virus   Acute respiratory failure with hypoxia (HCC)   Sepsis (HCC)   Hyperglycemia   Thrombocytosis (HCC)   Hyponatremia   Hypokalemia   Microcytic anemia   GI bleed   Symptomatic anemia   Lactic acidosis   Acute respiratory disease due to COVID-19 virus  Brief Summary:-  64 y.o. male with no pertinent medical history who presents to the emergency department due to increased weakness in the setting of COVID-19 virus infection -Patient is unvaccinated against COVID-19 virus -Patient tested positive on 09/24/2019 -He was offered outpatient monoclonal antibody infusion on 09/25/2019 which he declined--- please see documentation from nurse practitioner Faythe Casa on 09/25/19  -Patient readmitted on 09/28/2019 with worsening symptoms respiratory symptoms with hypoxia and chest x-ray consistent with Covid pneumonia   and also found to have heme positive stools and symptomatic anemia as well   A/p 1)Acute hypoxic respiratory failure secondary to COVID-19 infection/Pneumonia--- The treatment plan and use of medications  for treatment of COVID-19 infection and possible side effects were discussed with patient/family -----Patient/Family verbalizes understanding and agrees to treatment protocols  --Patient is positive for COVID-19 infection, chest x-ray with findings of infiltrates/opacities,   patient is tachypneic/hypoxic and requiring continuous supplemental oxygen---patient meets criteria for initiation of Remdesivir AND Steroid therapy per protocol  D-Dimer 1.89 >> 1.48>>1.26 Ferritin 59>>70>>95 CRP 17.4 >> 15.8>>7.9 -Fibrinogen is 698 PCT 0.17 LDH 304 --Check and trend inflammatory markers including D-dimer, ferritin and  CRP---also follow CBC and CMP --Supplemental oxygen to keep O2 sats above 93% -Follow serial chest x-rays and ABGs as indicated --- Encourage prone positioning for More than 16 hours/day in increments of 2 to 3 hours at a time if able to tolerate --Attempt to maintain euvolemic state --Zinc and vitamin C as ordered -Albuterol inhaler as needed -Accu-Cheks/fingersticks while on high-dose steroids -PPI while on high-dose steroids -Continue IV remdesivir and IV steroids started on 09/29/2019   2)Acute GI bleed--GI consult appreciated, continue IV Protonix, patient received 2 units of PRBC -Patient admits to recent NSAID use on a regular basis due to COVID-19 type myalgias -Outpatient endoluminal evaluation advised unless patient decompensates while inpatient  3)Presumed symptomatic acute blood loss anemia/iron deficiency anemia --- hemoglobin up to 9.3 from 7.0 after transfusion of 2 units of PRBCs -Ferritin is not low most likely due to COVID-19 infection -Serum iron is 8 with iron saturation of 3 --Patient denies any history of anemia until recently -Transfuse as above #2 -Endoluminal evaluation as noted above #2  4)History of BPH--- resume Flomax  5)Class II obesity-this increases overall morbidity and mortality -Low calorie diet, portion control and increase physical  activity discussed with patient after resolution of acute respiratory symptoms -Body mass index is 32.14 kg/m.  6) staph epi bacteremia----suspect contaminant, okay to stop IV Vanco -TTE noted  Disposition/Need for in-Hospital Stay- patient unable to be discharged at this time due  to acute hypoxic respiratory failure secondary to Covid pneumonia requiring IV remdesivir and IV steroids and supplemental oxygen as well as GI bleed with drop in H&H requiring transfusion and close monitoring  Status is: Inpatient  Remains inpatient appropriate because:acute hypoxic respiratory failure secondary to Covid pneumonia requiring IV remdesivir and IV steroids and supplemental oxygen as well as GI bleed with drop in H&H requiring transfusion and close monitoring  Disposition: The patient is from: Home              Anticipated d/c is to: Home              Anticipated d/c date is: > 3 days              Patient currently is not medically stable to d/c. Barriers: Not Clinically Stable- acute hypoxic respiratory failure secondary to Covid pneumonia requiring IV remdesivir and IV steroids and supplemental oxygen as well as GI bleed with drop in H&H requiring transfusion and close monitoring  Code Status : full code  Family Communication:    (patient is alert, awake and coherent)   Consults  :  Gi   DVT Prophylaxis  :    - SCDs /GI bleed  Lab Results  Component Value Date   PLT 519 (H) 09/30/2019    Inpatient Medications  Scheduled Meds: . sodium chloride   Intravenous Once  . albuterol  2 puff Inhalation Q6H  . vitamin C  500 mg Oral Daily  . diltiazem  30 mg Oral Q6H  . insulin aspart  0-5 Units Subcutaneous QHS  . insulin aspart  0-6 Units Subcutaneous TID WC  . methylPREDNISolone (SOLU-MEDROL) injection  60 mg Intravenous Q12H  . metoprolol tartrate  25 mg Oral TID  . [START ON 10/02/2019] pantoprazole  40 mg Intravenous Q12H  . tamsulosin  0.4 mg Oral QPC supper  . zinc sulfate  220 mg Oral Daily   Continuous Infusions: . pantoprozole (PROTONIX) infusion Stopped (09/29/19 2129)  . remdesivir 100 mg in NS 100 mL Stopped (09/30/19 1056)  . vancomycin     PRN Meds:.chlorpheniramine-HYDROcodone, guaiFENesin-dextromethorphan    Anti-infectives (From admission,  onward)   Start     Dose/Rate Route Frequency Ordered Stop   09/30/19 2000  vancomycin (VANCOCIN) IVPB 1000 mg/200 mL premix       "Followed by" Linked Group Details   1,000 mg 200 mL/hr over 60 Minutes Intravenous Every 12 hours 09/30/19 0751     09/30/19 1000  remdesivir 100 mg in sodium chloride 0.9 % 100 mL IVPB       "Followed by" Linked Group Details   100 mg 200 mL/hr over 30 Minutes Intravenous Daily 09/28/19 2358 10/04/19 0959   09/30/19 0800  vancomycin (VANCOREADY) IVPB 2000 mg/400 mL       "Followed by" Linked Group Details   2,000 mg 200 mL/hr over 120 Minutes Intravenous  Once 09/30/19 0751 09/30/19 1158   09/29/19 1000  remdesivir 100 mg in sodium chloride 0.9 % 100 mL IVPB  Status:  Discontinued       "Followed by" Linked Group Details   100 mg 200 mL/hr over 30 Minutes Intravenous Daily 09/28/19 2353 09/28/19 2358   09/29/19 0000  remdesivir 200  mg in sodium chloride 0.9% 250 mL IVPB  Status:  Discontinued       "Followed by" Linked Group Details   200 mg 580 mL/hr over 30 Minutes Intravenous Once 09/28/19 2353 09/28/19 2358   09/29/19 0000  remdesivir 100 mg in sodium chloride 0.9 % 100 mL IVPB       "Followed by" Linked Group Details   100 mg 200 mL/hr over 30 Minutes Intravenous Every 30 min 09/28/19 2358 09/29/19 1145        Objective:   Vitals:   09/30/19 1515 09/30/19 1630 09/30/19 1700 09/30/19 1715  BP:  113/66 107/66   Pulse: (!) 107 (!) 101 (!) 110 (!) 106  Resp: (!) 21 (!) 23 (!) 21 19  Temp:      TempSrc:      SpO2: 93% 93% 92% 94%  Weight:      Height:        Wt Readings from Last 3 Encounters:  09/28/19 107.5 kg  09/24/19 107.5 kg  08/15/18 108.9 kg     Intake/Output Summary (Last 24 hours) at 09/30/2019 1742 Last data filed at 09/30/2019 1056 Gross per 24 hour  Intake 101.52 ml  Output 1600 ml  Net -1498.48 ml   Physical Exam  Gen:- Awake Alert, some conversational dyspnea HEENT:- [place].AT, No sclera icterus Neck-Supple Neck,No  JVD,.  Lungs-diminished breath sounds with scattered wheezes bilaterally  CV- S1, S2 normal, regular  Abd-  +ve B.Sounds, Abd Soft, No tenderness,    Extremity/Skin:- No  edema, pedal pulses present Psych-affect is appropriate, oriented x3 Neuro-generalized weakness ,no new focal deficits, no tremors   Data Review:   Micro Results Recent Results (from the past 240 hour(s))  SARS Coronavirus 2 by RT PCR (hospital order, performed in [facility] hospital lab) Nasopharyngeal Nasopharyngeal Swab     Status: Abnormal   Collection Time: 09/24/19 11:24 AM   Specimen: Nasopharyngeal Swab  Result Value Ref Range Status   SARS Coronavirus 2 POSITIVE (A) NEGATIVE Final    Comment: RESULT CALLED TO, READ BACK BY AND VERIFIED WITH: SANDRA RN @ 2094 ON [id number] BY HENDERSON L (NOTE) SARS-CoV-2 target nucleic acids are DETECTED  SARS-CoV-2 RNA is generally detectable in upper respiratory specimens  during the acute phase of infection.  Positive results are indicative  of the presence of the identified virus, but do not rule out bacterial infection or co-infection with other pathogens not detected by the test.  Clinical correlation with patient history and  other diagnostic information is necessary to determine patient infection status.  The expected result is negative.  Fact Sheet for Patients:   StrictlyIdeas.no   Fact Sheet for Healthcare Providers:   BankingDealers.co.za    This test is not yet approved or cleared by the [place] FDA and  has been authorized for detection and/or diagnosis of SARS-CoV-2 by FDA under an Emergency Use Authorization (EUA).  This EUA will remain in effect (meaning  this test can be used) for the duration of  the COVID-19 declaration under Section 564(b)(1) of the Act, 21 U.S.C. section 360-bbb-3(b)(1), unless the authorization is terminated or revoked sooner.  Performed at [facility], [address]  [place], [place] [address]   Blood Culture (routine x 2)     Status: None (Preliminary result)   Collection Time: 09/28/19 11:19 PM   Specimen: BLOOD  Result Value Ref Range Status   Specimen Description   Final    BLOOD LEFT ANTECUBITAL Performed at [facility]  [address] [place], [place] [address]    Special Requests   Final    BOTTLES DRAWN AEROBIC AND ANAEROBIC Blood Culture adequate volume Performed at [facility], [address] [place], [place] [address]    Culture  Setup Time   Final    ANAEROBIC BOTTLE ONLY GRAM POSITIVE COCCI Gram Stain Report Called to,Read Back By and Verified With: T EASTER,RN@2206  09/29/19 MKELLY Organism ID to follow CRITICAL RESULT CALLED TO, READ BACK BY AND VERIFIED WITH: Merideth Abbey RN 09/30/19 4503 JDW Performed at [facility], [place] [address], [place], [place] [address]    Culture GRAM POSITIVE COCCI  Final   Report Status PENDING  Incomplete  Blood Culture ID Panel (Reflexed)     Status: Abnormal   Collection Time: 09/28/19 11:19 PM  Result Value Ref Range Status   Enterococcus faecalis NOT DETECTED NOT DETECTED Final   Enterococcus Faecium NOT DETECTED NOT DETECTED Final   Listeria monocytogenes NOT DETECTED NOT DETECTED Final   Staphylococcus species DETECTED (A) NOT DETECTED Final    Comment: CRITICAL RESULT CALLED TO, READ BACK BY AND VERIFIED WITH: K NICHOLS RN 09/30/19 0034 JDW    Staphylococcus aureus (BCID) NOT DETECTED NOT DETECTED Final   Staphylococcus epidermidis DETECTED (A) NOT DETECTED Final    Comment: Methicillin (oxacillin) resistant coagulase negative staphylococcus. Possible blood culture contaminant (unless isolated from more than one blood culture draw or clinical case suggests pathogenicity). No antibiotic treatment is indicated for blood  culture contaminants. CRITICAL RESULT CALLED TO, READ BACK BY AND VERIFIED WITH: K NICHOLS RN 09/30/19 9179 JDW    Staphylococcus lugdunensis NOT DETECTED NOT DETECTED Final    Streptococcus species NOT DETECTED NOT DETECTED Final   Streptococcus agalactiae NOT DETECTED NOT DETECTED Final   Streptococcus pneumoniae NOT DETECTED NOT DETECTED Final   Streptococcus pyogenes NOT DETECTED NOT DETECTED Final   A.calcoaceticus-baumannii NOT DETECTED NOT DETECTED Final   Bacteroides fragilis NOT DETECTED NOT DETECTED Final   Enterobacterales NOT DETECTED NOT DETECTED Final   Enterobacter cloacae complex NOT DETECTED NOT DETECTED Final   Escherichia coli NOT DETECTED NOT DETECTED Final   Klebsiella aerogenes NOT DETECTED NOT DETECTED Final   Klebsiella oxytoca NOT DETECTED NOT DETECTED Final   Klebsiella pneumoniae NOT DETECTED NOT DETECTED Final   Proteus species NOT DETECTED NOT DETECTED Final   Salmonella species NOT DETECTED NOT DETECTED Final   Serratia marcescens NOT DETECTED NOT DETECTED Final   Haemophilus influenzae NOT DETECTED NOT DETECTED Final   Neisseria meningitidis NOT DETECTED NOT DETECTED Final   Pseudomonas aeruginosa NOT DETECTED NOT DETECTED Final   Stenotrophomonas maltophilia NOT DETECTED NOT DETECTED Final   Candida albicans NOT DETECTED NOT DETECTED Final   Candida auris NOT DETECTED NOT DETECTED Final   Candida glabrata NOT DETECTED NOT DETECTED Final   Candida krusei NOT DETECTED NOT DETECTED Final   Candida parapsilosis NOT DETECTED NOT DETECTED Final   Candida tropicalis NOT DETECTED NOT DETECTED Final   Cryptococcus neoformans/gattii NOT DETECTED NOT DETECTED Final   Methicillin resistance mecA/C DETECTED (A) NOT DETECTED Final    Comment: CRITICAL RESULT CALLED TO, READ BACK BY AND VERIFIED WITH: Merideth Abbey RN 09/30/19 1505 JDW Performed at [facility], [address] [place], [place] [address]   Blood Culture (routine x 2)     Status: None (Preliminary result)   Collection Time: 09/28/19 11:34 PM   Specimen: BLOOD  Result Value Ref Range Status   Specimen Description BLOOD RIGHT  ANTECUBITAL  Final   Special Requests   Final      BOTTLES DRAWN AEROBIC AND ANAEROBIC Blood Culture adequate volume   Culture   Final    NO GROWTH 2 DAYS Performed at [facility], [address], [place], [place] [address]    Report Status PENDING  Incomplete    Radiology Reports DG Chest 2 View  Result Date: 09/15/2019 CLINICAL DATA:  Acute chest pain. EXAM: CHEST - 2 VIEW COMPARISON:  None. FINDINGS: The heart size and mediastinal contours are within normal limits. No pneumothorax or pleural effusion is noted. Right lung is clear. Minimal left basilar subsegmental atelectasis or scarring is noted. The visualized skeletal structures are unremarkable. IMPRESSION: Minimal left basilar subsegmental atelectasis or scarring. Electronically Signed   By: Marijo Conception M.D.   On: 09/15/2019 16:58   DG Ribs Unilateral Right  Result Date: 09/15/2019 CLINICAL DATA:  Acute right chest pain. EXAM: RIGHT RIBS - 2 VIEW COMPARISON:  None. FINDINGS: No fracture or other bone lesions are seen involving the ribs. IMPRESSION: Negative. Electronically Signed   By: Marijo Conception M.D.   On: 09/15/2019 16:57   DG Chest Port 1 View  Result Date: 09/28/2019 CLINICAL DATA:  COVID EXAM: PORTABLE CHEST 1 VIEW COMPARISON:  09/24/2019 FINDINGS: Development of patchy bilateral airspace opacities. No pleural effusion. Stable cardiomediastinal silhouette. No pneumothorax. IMPRESSION: Development of patchy bilateral airspace opacities, consistent with bilateral pneumonia. Electronically Signed   By: Donavan Foil M.D.   On: 09/28/2019 23:14   DG Chest Port 1 View  Result Date: 09/24/2019 CLINICAL DATA:  COVID pneumonia, weakness, fatigue EXAM: PORTABLE CHEST 1 VIEW COMPARISON:  09/15/2019 FINDINGS: The lungs are symmetrically expanded. Minimal left basilar atelectasis is again noted. No superimposed confluent pulmonary infiltrate. No pneumothorax or pleural effusion. Cardiac size within normal limits. The pulmonary vascularity is normal. No acute bone abnormality.  IMPRESSION: Minimal left basilar atelectasis, unchanged. No superimposed confluent pulmonary infiltrate. Electronically Signed   By: Fidela Salisbury MD   On: 09/24/2019 20:15   ECHOCARDIOGRAM COMPLETE  Result Date: 09/30/2019    ECHOCARDIOGRAM REPORT   Patient Name:   AB LEAMING Date of Exam: 09/30/2019 Medical Rec #:  [id number]         Height:       72.0 in Accession #:    [id number]        Weight:       237.0 lb Date of Birth:  1956-01-18        BSA:          2.290 m Patient Age:    80 years          BP:           95/60 mmHg Patient Gender: M                 HR:           126 bpm. Exam Location:  [facility] Procedure: 2D Echo Indications:    Endocarditis I38  History:        Patient has no prior history of Echocardiogram examinations.                 Risk Factors:Non-Smoker. Pneumonia due to COVID-19 virus,                 Sepsis, Lactic Acidosis.  Sonographer:    Leavy Cella RDCS (AE) Referring Phys: [id number] OLADAPO ADEFESO IMPRESSIONS  1. Left ventricular ejection fraction, by  estimation, is 55 to 60%. The left ventricle has normal function. The left ventricle has no regional wall motion abnormalities. There is moderate left ventricular hypertrophy. Left ventricular diastolic parameters are indeterminate.  2. Right ventricular systolic function is normal. The right ventricular size is normal. Tricuspid regurgitation signal is inadequate for assessing PA pressure.  3. The mitral valve is grossly normal. No evidence of mitral valve regurgitation.  4. The aortic valve is tricuspid. Aortic valve regurgitation is not visualized.  5. The inferior vena cava is normal in size with <50% respiratory variability, suggesting right atrial pressure of 8 mmHg.  6. Views are somewhat limited, but no definite valvular vegetations are visualized. FINDINGS  Left Ventricle: Left ventricular ejection fraction, by estimation, is 55 to 60%. The left ventricle has normal function. The left ventricle has no regional wall  motion abnormalities. The left ventricular internal cavity size was normal in size. There is  moderate left ventricular hypertrophy. Left ventricular diastolic parameters are indeterminate. Right Ventricle: The right ventricular size is normal. No increase in right ventricular wall thickness. Right ventricular systolic function is normal. Tricuspid regurgitation signal is inadequate for assessing PA pressure. Left Atrium: Left atrial size was normal in size. Right Atrium: Right atrial size was normal in size. Pericardium: There is no evidence of pericardial effusion. Mitral Valve: The mitral valve is grossly normal. No evidence of mitral valve regurgitation. Tricuspid Valve: The tricuspid valve is grossly normal. Tricuspid valve regurgitation is trivial. Aortic Valve: The aortic valve is tricuspid. Aortic valve regurgitation is not visualized. Pulmonic Valve: The pulmonic valve was grossly normal. Pulmonic valve regurgitation is trivial. Aorta: The aortic root is normal in size and structure. Venous: The inferior vena cava is normal in size with less than 50% respiratory variability, suggesting right atrial pressure of 8 mmHg. IAS/Shunts: No atrial level shunt detected by color flow Doppler.  LEFT VENTRICLE PLAX 2D LVIDd:         4.37 cm Diastology LVIDs:         2.20 cm LV e' medial:    7.18 cm/s LV PW:         1.72 cm LV E/e' medial:  9.2 LV IVS:        1.46 cm LV e' lateral:   12.90 cm/s                        LV E/e' lateral: 5.1  RIGHT VENTRICLE RV S prime:     16.00 cm/s TAPSE (M-mode): 1.5 cm LEFT ATRIUM             Index       RIGHT ATRIUM           Index LA diam:        3.70 cm 1.62 cm/m  RA Area:     15.40 cm LA Vol (A2C):   63.9 ml 27.90 ml/m RA Volume:   39.80 ml  17.38 ml/m LA Vol (A4C):   40.6 ml 17.73 ml/m LA Biplane Vol: 52.4 ml 22.88 ml/m   AORTA Ao Root diam: 3.50 cm MITRAL VALVE MV Area (PHT): 6.60 cm MV Decel Time: 115 msec MV E velocity: 66.00 cm/s MV A velocity: 39.80 cm/s MV E/A ratio:   1.66 Rozann Lesches MD Electronically signed by Rozann Lesches MD Signature Date/Time: 09/30/2019/4:58:16 PM    Final      CBC Recent Labs  Lab 09/24/19 1208 09/28/19 2239 09/29/19 0950 09/30/19 0616  WBC 9.3 9.7 4.8  10.5  HGB 9.1* 7.3* 7.0* 9.3*  HCT 31.9* 25.5* 24.6* 31.0*  PLT 576* 545* 453* 519*  MCV 75.6* 75.4* 75.2* 76.7*  MCH 21.6* 21.6* 21.4* 23.0*  MCHC 28.5* 28.6* 28.5* 30.0  RDW 15.4 15.9* 15.7* 16.0*  LYMPHSABS  --  0.2* 0.3* 0.4*  MONOABS  --  0.3 0.1 0.5  EOSABS  --  0.0 0.0 0.0  BASOSABS  --  0.0 0.0 0.0    Chemistries  Recent Labs  Lab 09/24/19 1208 09/28/19 2239 09/29/19 0950 09/30/19 0616  NA 131* 131* 133* 133*  K 3.6 3.3* 4.8 3.7  CL 93* 91* 94* 95*  CO2 25 28 27 29   GLUCOSE 165* 198* 236* 291*  BUN 23 18 18 20   CREATININE 1.01 0.90 0.73 0.70  CALCIUM 8.5* 8.0* 7.9* 8.0*  MG  --   --  2.4 2.3  AST  --  53* 59* 28  ALT  --  35 35 30  ALKPHOS  --  55 48 46  BILITOT  --  0.4 0.9 0.6   ------------------------------------------------------------------------------------------------------------------ Recent Labs    09/28/19 2239  TRIG 49    Lab Results  Component Value Date   HGBA1C 6.9 (H) 09/28/2019   ------------------------------------------------------------------------------------------------------------------ No results for input(s): TSH, T4TOTAL, T3FREE, THYROIDAB in the last 72 hours.  Invalid input(s): FREET3 ------------------------------------------------------------------------------------------------------------------ Recent Labs    09/28/19 2239 09/28/19 2334 09/29/19 0950 09/30/19 0616  FERRITIN   < >  --  70 95  TIBC  --  257  --   --   IRON  --  8*  --   --    < > = values in this interval not displayed.    Coagulation profile No results for input(s): INR, PROTIME in the last 168 hours.  Recent Labs    09/29/19 0950 09/30/19 0616  DDIMER 1.48* 1.26*    Cardiac Enzymes No results for input(s): CKMB,  TROPONINI, MYOGLOBIN in the last 168 hours.  Invalid input(s): CK ------------------------------------------------------------------------------------------------------------------ No results found for: BNP   Roxan Hockey M.D on 09/30/2019 at 5:42 PM  Go to [email or web address] - for contact info  [facility] - Office  [phone]

## 2019-09-30 NOTE — ED Notes (Signed)
Awaiting call back from RN for report

## 2019-09-30 NOTE — ED Notes (Addendum)
Blood culture results reported to Adefeso .

## 2019-09-30 NOTE — Progress Notes (Signed)
Inpatient Diabetes Program Recommendations  AACE/ADA: New Consensus Statement on Inpatient Glycemic Control  Target Ranges:  Prepandial:   less than 140 mg/dL      Peak postprandial:   less than 180 mg/dL (1-2 hours)      Critically ill patients:  140 - 180 mg/dL   Results for REON, HUNLEY (MRN [id number]) as of 09/30/2019 07:41  Ref. Range 09/28/2019 22:39 09/29/2019 09:50 09/30/2019 06:16  Glucose Latest Ref Range: 70 - 99 mg/dL 198 (H) 236 (H) 291 (H)   Review of Glycemic Control  Diabetes history: No Outpatient Diabetes medications: NA Current orders for Inpatient glycemic control: Novolog 0-6 units TID with meals, Novolog 0-5 units QHS; Solumedrol 60 mg Q12H  Inpatient Diabetes Program Recommendations:    Insulin-If steroids are continued as ordered please consider ordering Levemir 10 units Q24H (based on 107.5 kg x 0.1 units).  Thanks, Barnie Alderman, RN, MSN, CDE Diabetes Coordinator Inpatient Diabetes Program [phone] (Team Pager from 8am to 5pm)

## 2019-09-30 NOTE — ED Notes (Signed)
Dr Joesph Fillers in to assess

## 2019-09-30 NOTE — ED Notes (Signed)
Call to floor for report   Per Renee, RN "she's in a room and will call you back"

## 2019-09-30 NOTE — Progress Notes (Signed)
Patient's condition discussed with Dr. Roxan Hockey. No evidence of overt GI bleed. Hemoglobin has increased from 7 to 9.3 g with 1 unit of PRBCs. Patient remains on pantoprazole infusion. Patient is on heart healthy diet. Pantoprazole infusion to be transitioned to oral route at 72 hours. Will consider endoscopic evaluation during this admission if hemoglobin drops to the point that needs needs transfusion. Review of his records revealed that he had heme positive stool 1 year ago as well.

## 2019-09-30 NOTE — ED Notes (Signed)
Non smoker  No covid vaccines   Reports he needs to sleep as sleep is healing   Poor insight regarding his diagnosis   O2 increased to 4 L

## 2019-09-30 NOTE — ED Notes (Signed)
Call for report x 2   RN is in a covid room and will call when she comes out

## 2019-10-01 ENCOUNTER — Other Ambulatory Visit: Payer: Self-pay

## 2019-10-01 LAB — COMPREHENSIVE METABOLIC PANEL
ALT: 31 U/L (ref 0–44)
AST: 26 U/L (ref 15–41)
Albumin: 2.3 g/dL — ABNORMAL LOW (ref 3.5–5.0)
Alkaline Phosphatase: 49 U/L (ref 38–126)
Anion gap: 12 (ref 5–15)
BUN: 25 mg/dL — ABNORMAL HIGH (ref 8–23)
CO2: 30 mmol/L (ref 22–32)
Calcium: 8.3 mg/dL — ABNORMAL LOW (ref 8.9–10.3)
Chloride: 93 mmol/L — ABNORMAL LOW (ref 98–111)
Creatinine, Ser: 0.83 mg/dL (ref 0.61–1.24)
GFR calc Af Amer: >60 mL/min (ref >60)
GFR calc non Af Amer: >60 mL/min (ref >60)
Glucose, Bld: 375 mg/dL — ABNORMAL HIGH (ref 70–99)
Potassium: 4.4 mmol/L (ref 3.5–5.1)
Sodium: 135 mmol/L (ref 135–145)
Total Bilirubin: 0.5 mg/dL (ref 0.3–1.2)
Total Protein: 5.8 g/dL — ABNORMAL LOW (ref 6.5–8.1)

## 2019-10-01 LAB — CBC WITH DIFFERENTIAL/PLATELET
Abs Immature Granulocytes: 0.14 10*3/uL — ABNORMAL HIGH (ref 0.00–0.07)
Basophils Absolute: 0 10*3/uL (ref 0.0–0.1)
Basophils Relative: 0 %
Eosinophils Absolute: 0 10*3/uL (ref 0.0–0.5)
Eosinophils Relative: 0 %
HCT: 31.8 % — ABNORMAL LOW (ref 39.0–52.0)
Hemoglobin: 9.5 g/dL — ABNORMAL LOW (ref 13.0–17.0)
Immature Granulocytes: 1 %
Lymphocytes Relative: 1 %
Lymphs Abs: 0.2 10*3/uL — ABNORMAL LOW (ref 0.7–4.0)
MCH: 23.3 pg — ABNORMAL LOW (ref 26.0–34.0)
MCHC: 29.9 g/dL — ABNORMAL LOW (ref 30.0–36.0)
MCV: 78.1 fL — ABNORMAL LOW (ref 80.0–100.0)
Monocytes Absolute: 0.5 10*3/uL (ref 0.1–1.0)
Monocytes Relative: 3 %
Neutro Abs: 13.3 10*3/uL — ABNORMAL HIGH (ref 1.7–7.7)
Neutrophils Relative %: 95 %
Platelets: 591 10*3/uL — ABNORMAL HIGH (ref 150–400)
RBC: 4.07 MIL/uL — ABNORMAL LOW (ref 4.22–5.81)
RDW: 16.7 % — ABNORMAL HIGH (ref 11.5–15.5)
WBC: 14.1 10*3/uL — ABNORMAL HIGH (ref 4.0–10.5)
nRBC: 0.4 % — ABNORMAL HIGH (ref 0.0–0.2)

## 2019-10-01 LAB — C-REACTIVE PROTEIN: CRP: 3.6 mg/dL — ABNORMAL HIGH (ref <1.0)

## 2019-10-01 LAB — GLUCOSE, CAPILLARY
Glucose-Capillary: 368 mg/dL — ABNORMAL HIGH (ref 70–99)
Glucose-Capillary: 427 mg/dL — ABNORMAL HIGH (ref 70–99)
Glucose-Capillary: 394 mg/dL — ABNORMAL HIGH (ref 70–99)
Glucose-Capillary: 365 mg/dL — ABNORMAL HIGH (ref 70–99)

## 2019-10-01 LAB — MAGNESIUM: Magnesium: 2.4 mg/dL (ref 1.7–2.4)

## 2019-10-01 LAB — PHOSPHORUS: Phosphorus: 2.5 mg/dL (ref 2.5–4.6)

## 2019-10-01 LAB — FERRITIN: Ferritin: 90 ng/mL (ref 24–336)

## 2019-10-01 LAB — D-DIMER, QUANTITATIVE: D-Dimer, Quant: 1.13 ug/mL-FEU — ABNORMAL HIGH (ref 0.00–0.50)

## 2019-10-01 MED ORDER — INSULIN ASPART 100 UNIT/ML ~~LOC~~ SOLN
0.0000 [IU] | Freq: Every day | SUBCUTANEOUS | Status: DC
  Administered 2019-10-01 – 2019-10-02 (×2): 5 [IU] via SUBCUTANEOUS
  Administered 2019-10-03: 4 [IU] via SUBCUTANEOUS
  Administered 2019-10-05 – 2019-10-07 (×3): 3 [IU] via SUBCUTANEOUS
  Administered 2019-10-08: 2 [IU] via SUBCUTANEOUS
  Administered 2019-10-09: 3 [IU] via SUBCUTANEOUS

## 2019-10-01 MED ORDER — INSULIN ASPART 100 UNIT/ML ~~LOC~~ SOLN
0.0000 [IU] | Freq: Three times a day (TID) | SUBCUTANEOUS | Status: DC
  Administered 2019-10-01 (×2): 20 [IU] via SUBCUTANEOUS
  Administered 2019-10-02: 15 [IU] via SUBCUTANEOUS
  Administered 2019-10-03 (×2): 20 [IU] via SUBCUTANEOUS
  Administered 2019-10-04: 11 [IU] via SUBCUTANEOUS
  Administered 2019-10-04 (×2): 7 [IU] via SUBCUTANEOUS
  Administered 2019-10-05: 11 [IU] via SUBCUTANEOUS
  Administered 2019-10-05: 7 [IU] via SUBCUTANEOUS
  Administered 2019-10-05: 15 [IU] via SUBCUTANEOUS
  Administered 2019-10-06: 4 [IU] via SUBCUTANEOUS
  Administered 2019-10-06 – 2019-10-07 (×4): 7 [IU] via SUBCUTANEOUS
  Administered 2019-10-08: 3 [IU] via SUBCUTANEOUS
  Administered 2019-10-08: 4 [IU] via SUBCUTANEOUS
  Administered 2019-10-09: 11 [IU] via SUBCUTANEOUS
  Administered 2019-10-09: 7 [IU] via SUBCUTANEOUS
  Administered 2019-10-10 – 2019-10-12 (×4): 3 [IU] via SUBCUTANEOUS
  Administered 2019-10-13: 7 [IU] via SUBCUTANEOUS
  Administered 2019-10-13: 4 [IU] via SUBCUTANEOUS
  Administered 2019-10-13 – 2019-10-14 (×2): 7 [IU] via SUBCUTANEOUS
  Administered 2019-10-14: 4 [IU] via SUBCUTANEOUS
  Administered 2019-10-14: 7 [IU] via SUBCUTANEOUS
  Administered 2019-10-15 (×3): 4 [IU] via SUBCUTANEOUS
  Administered 2019-10-16: 7 [IU] via SUBCUTANEOUS
  Administered 2019-10-16 – 2019-10-17 (×3): 4 [IU] via SUBCUTANEOUS
  Administered 2019-10-17: 3 [IU] via SUBCUTANEOUS
  Administered 2019-10-17: 4 [IU] via SUBCUTANEOUS
  Administered 2019-10-18: 3 [IU] via SUBCUTANEOUS
  Administered 2019-10-18: 4 [IU] via SUBCUTANEOUS
  Administered 2019-10-18: 3 [IU] via SUBCUTANEOUS
  Administered 2019-10-19: 4 [IU] via SUBCUTANEOUS
  Administered 2019-10-19 – 2019-10-20 (×3): 7 [IU] via SUBCUTANEOUS
  Administered 2019-10-20 – 2019-10-21 (×3): 4 [IU] via SUBCUTANEOUS
  Administered 2019-10-21: 3 [IU] via SUBCUTANEOUS
  Administered 2019-10-21 – 2019-10-24 (×10): 4 [IU] via SUBCUTANEOUS
  Administered 2019-10-25: 3 [IU] via SUBCUTANEOUS

## 2019-10-01 MED ORDER — INSULIN GLARGINE 100 UNIT/ML ~~LOC~~ SOLN
12.0000 [IU] | Freq: Every day | SUBCUTANEOUS | Status: DC
  Administered 2019-10-01 – 2019-10-02 (×2): 12 [IU] via SUBCUTANEOUS
  Filled 2019-10-01 (×3): qty 0.12

## 2019-10-01 MED ORDER — INSULIN ASPART 100 UNIT/ML ~~LOC~~ SOLN
6.0000 [IU] | Freq: Three times a day (TID) | SUBCUTANEOUS | Status: DC
  Administered 2019-10-01 – 2019-10-03 (×5): 6 [IU] via SUBCUTANEOUS

## 2019-10-01 NOTE — Plan of Care (Signed)
  Problem: Education: Goal: Knowledge of General Education information will improve Description: Including pain rating scale, medication(s)/side effects and non-pharmacologic comfort measures 10/01/2019 1416 by Santa Lighter, RN Outcome: Progressing 10/01/2019 1158 by Santa Lighter, RN Outcome: Progressing 10/01/2019 1156 by Santa Lighter, RN Outcome: Progressing   Problem: Health Behavior/Discharge Planning: Goal: Ability to manage health-related needs will improve 10/01/2019 1416 by Santa Lighter, RN Outcome: Progressing 10/01/2019 1158 by Santa Lighter, RN Outcome: Progressing 10/01/2019 1156 by Santa Lighter, RN Outcome: Progressing   Problem: Clinical Measurements: Goal: Ability to maintain clinical measurements within normal limits will improve 10/01/2019 1416 by Santa Lighter, RN Outcome: Progressing 10/01/2019 1156 by Santa Lighter, RN Outcome: Progressing Goal: Will remain free from infection Outcome: Progressing Goal: Respiratory complications will improve Outcome: Progressing   Problem: Education: Goal: Knowledge of risk factors and measures for prevention of condition will improve 10/01/2019 1416 by Santa Lighter, RN Outcome: Progressing 10/01/2019 1156 by Santa Lighter, RN Outcome: Progressing   Problem: Coping: Goal: Psychosocial and spiritual needs will be supported 10/01/2019 1416 by Santa Lighter, RN Outcome: Progressing 10/01/2019 1156 by Santa Lighter, RN Outcome: Progressing   Problem: Respiratory: Goal: Will maintain a patent airway 10/01/2019 1416 by Santa Lighter, RN Outcome: Progressing 10/01/2019 1156 by Santa Lighter, RN Outcome: Progressing Goal: Complications related to the disease process, condition or treatment will be avoided or minimized 10/01/2019 1416 by Santa Lighter, RN Outcome: Progressing 10/01/2019 1156 by Santa Lighter, RN Outcome: Progressing

## 2019-10-01 NOTE — Plan of Care (Signed)
  Problem: Health Behavior/Discharge Planning: Goal: Ability to manage health-related needs will improve 10/01/2019 1158 by Santa Lighter, RN Outcome: Progressing 10/01/2019 1156 by Santa Lighter, RN Outcome: Progressing   Problem: Education: Goal: Knowledge of risk factors and measures for prevention of condition will improve Outcome: Progressing   Problem: Coping: Goal: Psychosocial and spiritual needs will be supported Outcome: Progressing   Problem: Respiratory: Goal: Will maintain a patent airway Outcome: Progressing Goal: Complications related to the disease process, condition or treatment will be avoided or minimized Outcome: Progressing

## 2019-10-01 NOTE — Progress Notes (Signed)
Patient Demographics:    Stephen Wilcox, is a 64 y.o. male, DOB - Aug 30, 1955, HMC:[id number]  Admit date - 09/28/2019   Admitting Physician Daesean Lazarz Denton Brick, MD  Outpatient Primary MD for the patient is Jake Samples, PA-C  LOS - 3   Chief Complaint  Patient presents with   Covid Positive        Subjective:    Stephen Wilcox today has no emesis,  No chest pain,   -Shortness of breath, cough and hypoxia persists -No further fevers -Elevated blood sugars noted   Assessment  & Plan :    Active Problems:   Pneumonia due to COVID-19 virus   Acute respiratory failure with hypoxia (HCC)   Sepsis (HCC)   Hyperglycemia   Thrombocytosis (HCC)   Hyponatremia   Hypokalemia   Microcytic anemia   GI bleed   Symptomatic anemia   Lactic acidosis   Acute respiratory disease due to COVID-19 virus  Brief Summary:-  64 y.o. male with no pertinent medical history who presents to the emergency department due to increased weakness in the setting of COVID-19 virus infection -Patient is unvaccinated against COVID-19 virus -Patient tested positive on 09/24/2019 -He was offered outpatient monoclonal antibody infusion on 09/25/2019 which he declined--- please see documentation from nurse practitioner Faythe Casa on 09/25/19  -Patient readmitted on 09/28/2019 with worsening symptoms respiratory symptoms with hypoxia and chest x-ray consistent with Covid pneumonia   and also found to have heme positive stools and symptomatic anemia as well   A/p 1)Acute hypoxic respiratory failure secondary to COVID-19 infection/Pneumonia--- The treatment plan and use of medications  for treatment of COVID-19 infection and possible side effects were discussed with patient/family -----Patient/Family verbalizes understanding and agrees to treatment protocols  --Patient is positive for COVID-19 infection, chest x-ray with findings of  infiltrates/opacities,  patient is tachypneic/hypoxic and requiring continuous supplemental oxygen---patient meets criteria for initiation of Remdesivir AND Steroid therapy per protocol  D-Dimer 1.89 >> 1.48>>1.26>> 1.13 Ferritin 59>>70>>95>>90 CRP 17.4 >> 15.8>>7.9>>3.6 -Fibrinogen is 698 PCT 0.17 LDH 304 --Check and trend inflammatory markers including D-dimer, ferritin and  CRP---also follow CBC and CMP --Supplemental oxygen to keep O2 sats above 93% -Follow serial chest x-rays and ABGs as indicated --- Encourage prone positioning for More than 16 hours/day in increments of 2 to 3 hours at a time if able to tolerate --Attempt to maintain euvolemic state --Zinc and vitamin C as ordered -Albuterol inhaler as needed -Accu-Cheks/fingersticks while on high-dose steroids -PPI while on high-dose steroids -Continue IV remdesivir and IV steroids started on 09/29/2019   2)Acute GI bleed--GI consult appreciated,  -continue  Protonix,  patient received 2 units of PRBC -Patient admits to recent NSAID use on a regular basis due to COVID-19 type myalgias -Outpatient endoluminal evaluation advised unless patient decompensates while inpatient  3)Presumed symptomatic acute blood loss anemia/iron deficiency anemia --- hemoglobin up to 9.5 from 7.0 after transfusion of 2 units of PRBCs -Ferritin is not low most likely due to COVID-19 infection -Serum iron is 8 with iron saturation of 3 --Patient denies any history of anemia until recently -Transfuse as above #2 -Endoluminal evaluation as noted above #2  4)History of BPH--- resume Flomax  5)Class II obesity-this increases overall morbidity and mortality -Low  calorie diet, portion control and increase physical activity discussed with patient after resolution of acute respiratory symptoms -Body mass index is 32.14 kg/m.  6) staph epi bacteremia----suspect contaminant, okay to stop IV Vanco -TTE noted  7) steroid-induced hyperglycemia--- insulin  therapy adjusted   Disposition/Need for in-Hospital Stay- patient unable to be discharged at this time due to acute hypoxic respiratory failure secondary to Covid pneumonia requiring IV remdesivir and IV steroids and supplemental oxygen as well as GI bleed with drop in H&H requiring transfusion and close monitoring  Status is: Inpatient  Remains inpatient appropriate because:acute hypoxic respiratory failure secondary to Covid pneumonia requiring IV remdesivir and IV steroids and supplemental oxygen as well as GI bleed with drop in H&H requiring transfusion and close monitoring  Disposition: The patient is from: Home              Anticipated d/c is to: Home              Anticipated d/c date is: > 3 days              Patient currently is not medically stable to d/c. Barriers: Not Clinically Stable- acute hypoxic respiratory failure secondary to Covid pneumonia requiring IV remdesivir and IV steroids and supplemental oxygen as well as GI bleed with drop in H&H requiring transfusion and close monitoring  Code Status : full code  Family Communication:    (patient is alert, awake and coherent)   Consults  :  Gi   DVT Prophylaxis  :    - SCDs /GI bleed  Lab Results  Component Value Date   PLT 591 (H) 10/01/2019    Inpatient Medications  Scheduled Meds:  sodium chloride   Intravenous Once   albuterol  2 puff Inhalation Q6H   vitamin C  500 mg Oral Daily   diltiazem  30 mg Oral Q6H   insulin aspart  0-20 Units Subcutaneous TID WC   insulin aspart  0-5 Units Subcutaneous QHS   insulin aspart  6 Units Subcutaneous TID WC   insulin glargine  12 Units Subcutaneous Daily   methylPREDNISolone (SOLU-MEDROL) injection  60 mg Intravenous Q12H   metoprolol tartrate  25 mg Oral TID   [START ON 10/02/2019] pantoprazole  40 mg Intravenous Q12H   tamsulosin  0.4 mg Oral QPC supper   zinc sulfate  220 mg Oral Daily   Continuous Infusions:  pantoprozole (PROTONIX) infusion Stopped  (09/29/19 2129)   remdesivir 100 mg in NS 100 mL 100 mg (10/01/19 1034)   PRN Meds:.chlorpheniramine-HYDROcodone, guaiFENesin-dextromethorphan    Anti-infectives (From admission, onward)   Start     Dose/Rate Route Frequency Ordered Stop   09/30/19 2000  vancomycin (VANCOCIN) IVPB 1000 mg/200 mL premix       "Followed by" Linked Group Details   1,000 mg 200 mL/hr over 60 Minutes Intravenous Every 12 hours 09/30/19 0751 09/30/19 2158   09/30/19 1000  remdesivir 100 mg in sodium chloride 0.9 % 100 mL IVPB       "Followed by" Linked Group Details   100 mg 200 mL/hr over 30 Minutes Intravenous Daily 09/28/19 2358 10/04/19 0959   09/30/19 0800  vancomycin (VANCOREADY) IVPB 2000 mg/400 mL       "Followed by" Linked Group Details   2,000 mg 200 mL/hr over 120 Minutes Intravenous  Once 09/30/19 0751 09/30/19 1158   09/29/19 1000  remdesivir 100 mg in sodium chloride 0.9 % 100 mL IVPB  Status:  Discontinued       "  Followed by" Linked Group Details   100 mg 200 mL/hr over 30 Minutes Intravenous Daily 09/28/19 2353 09/28/19 2358   09/29/19 0000  remdesivir 200 mg in sodium chloride 0.9% 250 mL IVPB  Status:  Discontinued       "Followed by" Linked Group Details   200 mg 580 mL/hr over 30 Minutes Intravenous Once 09/28/19 2353 09/28/19 2358   09/29/19 0000  remdesivir 100 mg in sodium chloride 0.9 % 100 mL IVPB       "Followed by" Linked Group Details   100 mg 200 mL/hr over 30 Minutes Intravenous Every 30 min 09/28/19 2358 09/29/19 1145        Objective:   Vitals:   10/01/19 0503 10/01/19 0831 10/01/19 1221 10/01/19 1442  BP: 98/65  122/77 113/75  Pulse: 91  99 (!) 101  Resp: (!) 21  20 18   Temp: 98.1 F (36.7 C)  97.9 F (36.6 C) 97.9 F (36.6 C)  TempSrc:   Oral   SpO2: 92% 93% 92% 92%  Weight:      Height:        Wt Readings from Last 3 Encounters:  09/28/19 107.5 kg  09/24/19 107.5 kg  08/15/18 108.9 kg     Intake/Output Summary (Last 24 hours) at 10/01/2019  1647 Last data filed at 10/01/2019 0300 Gross per 24 hour  Intake 233.36 ml  Output --  Net 233.36 ml   Physical Exam  Gen:- Awake Alert, some conversational dyspnea HEENT:- [place].AT, No sclera icterus Nose- [place] 4L/min Neck-Supple Neck,No JVD,.  Lungs-diminished breath sounds with scattered wheezes bilaterally  CV- S1, S2 normal, regular  Abd-  +ve B.Sounds, Abd Soft, No tenderness,    Extremity/Skin:- No  edema, pedal pulses present Psych-affect is appropriate, oriented x3 Neuro-generalized weakness ,no new focal deficits, no tremors   Data Review:   Micro Results Recent Results (from the past 240 hour(s))  SARS Coronavirus 2 by RT PCR (hospital order, performed in [facility] hospital lab) Nasopharyngeal Nasopharyngeal Swab     Status: Abnormal   Collection Time: 09/24/19 11:24 AM   Specimen: Nasopharyngeal Swab  Result Value Ref Range Status   SARS Coronavirus 2 POSITIVE (A) NEGATIVE Final    Comment: RESULT CALLED TO, READ BACK BY AND VERIFIED WITH: SANDRA RN @ [id number] ON [id number] BY HENDERSON L (NOTE) SARS-CoV-2 target nucleic acids are DETECTED  SARS-CoV-2 RNA is generally detectable in upper respiratory specimens  during the acute phase of infection.  Positive results are indicative  of the presence of the identified virus, but do not rule out bacterial infection or co-infection with other pathogens not detected by the test.  Clinical correlation with patient history and  other diagnostic information is necessary to determine patient infection status.  The expected result is negative.  Fact Sheet for Patients:   StrictlyIdeas.no   Fact Sheet for Healthcare Providers:   BankingDealers.co.za    This test is not yet approved or cleared by the [place] FDA and  has been authorized for detection and/or diagnosis of SARS-CoV-2 by FDA under an Emergency Use Authorization (EUA).  This EUA will remain in effect (meaning  this  test can be used) for the duration of  the COVID-19 declaration under Section 564(b)(1) of the Act, 21 U.S.C. section 360-bbb-3(b)(1), unless the authorization is terminated or revoked sooner.  Performed at [facility], [address] [place],  [address]   Blood Culture (routine x 2)     Status: Abnormal (Preliminary result)  Collection Time: 09/28/19 11:19 PM   Specimen: BLOOD  Result Value Ref Range Status   Specimen Description   Final    BLOOD LEFT ANTECUBITAL Performed at [facility], [address] [place], [place] [address]    Special Requests   Final    BOTTLES DRAWN AEROBIC AND ANAEROBIC Blood Culture adequate volume Performed at [place] [place], [place] [address]    Culture  Setup Time   Final    ANAEROBIC BOTTLE ONLY GRAM POSITIVE COCCI Gram Stain Report Called to,Read Back By and Verified With: T EASTER,RN@2206  09/29/19 MKELLY CRITICAL RESULT CALLED TO, READ BACK BY AND VERIFIED WITH: Merideth Abbey RN 09/30/19 6767 JDW Performed at [facility], [place] [address] [place], [place] [address]    Culture STAPHYLOCOCCUS EPIDERMIDIS (A)  Final   Report Status PENDING  Incomplete  Blood Culture ID Panel (Reflexed)     Status: Abnormal   Collection Time: 09/28/19 11:19 PM  Result Value Ref Range Status   Enterococcus faecalis NOT DETECTED NOT DETECTED Final   Enterococcus Faecium NOT DETECTED NOT DETECTED Final   Listeria monocytogenes NOT DETECTED NOT DETECTED Final   Staphylococcus species DETECTED (A) NOT DETECTED Final    Comment: CRITICAL RESULT CALLED TO, READ BACK BY AND VERIFIED WITH: K NICHOLS RN 09/30/19 0962 JDW    Staphylococcus aureus (BCID) NOT DETECTED NOT DETECTED Final   Staphylococcus epidermidis DETECTED (A) NOT DETECTED Final    Comment: Methicillin (oxacillin) resistant coagulase negative staphylococcus. Possible blood culture contaminant (unless isolated from more than one blood culture draw or clinical case suggests  pathogenicity). No antibiotic treatment is indicated for blood  culture contaminants. CRITICAL RESULT CALLED TO, READ BACK BY AND VERIFIED WITH: K NICHOLS RN 09/30/19 8366 JDW    Staphylococcus lugdunensis NOT DETECTED NOT DETECTED Final   Streptococcus species NOT DETECTED NOT DETECTED Final   Streptococcus agalactiae NOT DETECTED NOT DETECTED Final   Streptococcus pneumoniae NOT DETECTED NOT DETECTED Final   Streptococcus pyogenes NOT DETECTED NOT DETECTED Final   A.calcoaceticus-baumannii NOT DETECTED NOT DETECTED Final   Bacteroides fragilis NOT DETECTED NOT DETECTED Final   Enterobacterales NOT DETECTED NOT DETECTED Final   Enterobacter cloacae complex NOT DETECTED NOT DETECTED Final   Escherichia coli NOT DETECTED NOT DETECTED Final   Klebsiella aerogenes NOT DETECTED NOT DETECTED Final   Klebsiella oxytoca NOT DETECTED NOT DETECTED Final   Klebsiella pneumoniae NOT DETECTED NOT DETECTED Final   Proteus species NOT DETECTED NOT DETECTED Final   Salmonella species NOT DETECTED NOT DETECTED Final   Serratia marcescens NOT DETECTED NOT DETECTED Final   Haemophilus influenzae NOT DETECTED NOT DETECTED Final   Neisseria meningitidis NOT DETECTED NOT DETECTED Final   Pseudomonas aeruginosa NOT DETECTED NOT DETECTED Final   Stenotrophomonas maltophilia NOT DETECTED NOT DETECTED Final   Candida albicans NOT DETECTED NOT DETECTED Final   Candida auris NOT DETECTED NOT DETECTED Final   Candida glabrata NOT DETECTED NOT DETECTED Final   Candida krusei NOT DETECTED NOT DETECTED Final   Candida parapsilosis NOT DETECTED NOT DETECTED Final   Candida tropicalis NOT DETECTED NOT DETECTED Final   Cryptococcus neoformans/gattii NOT DETECTED NOT DETECTED Final   Methicillin resistance mecA/C DETECTED (A) NOT DETECTED Final    Comment: CRITICAL RESULT CALLED TO, READ BACK BY AND VERIFIED WITH: Merideth Abbey RN 09/30/19 2947 JDW Performed at [facility], [address] [place], [place]  [address]   Blood Culture (routine x 2)  Status: None (Preliminary result)   Collection Time: 09/28/19 11:34 PM   Specimen: BLOOD  Result Value Ref Range Status   Specimen Description BLOOD RIGHT ANTECUBITAL  Final   Special Requests   Final    BOTTLES DRAWN AEROBIC AND ANAEROBIC Blood Culture adequate volume   Culture   Final    NO GROWTH 3 DAYS Performed at [facility], [address] [place], [place] [address]    Report Status PENDING  Incomplete    Radiology Reports DG Chest 2 View  Result Date: 09/15/2019 CLINICAL DATA:  Acute chest pain. EXAM: CHEST - 2 VIEW COMPARISON:  None. FINDINGS: The heart size and mediastinal contours are within normal limits. No pneumothorax or pleural effusion is noted. Right lung is clear. Minimal left basilar subsegmental atelectasis or scarring is noted. The visualized skeletal structures are unremarkable. IMPRESSION: Minimal left basilar subsegmental atelectasis or scarring. Electronically Signed   By: Marijo Conception M.D.   On: 09/15/2019 16:58   DG Ribs Unilateral Right  Result Date: 09/15/2019 CLINICAL DATA:  Acute right chest pain. EXAM: RIGHT RIBS - 2 VIEW COMPARISON:  None. FINDINGS: No fracture or other bone lesions are seen involving the ribs. IMPRESSION: Negative. Electronically Signed   By: Marijo Conception M.D.   On: 09/15/2019 16:57   DG Chest Port 1 View  Result Date: 09/28/2019 CLINICAL DATA:  COVID EXAM: PORTABLE CHEST 1 VIEW COMPARISON:  09/24/2019 FINDINGS: Development of patchy bilateral airspace opacities. No pleural effusion. Stable cardiomediastinal silhouette. No pneumothorax. IMPRESSION: Development of patchy bilateral airspace opacities, consistent with bilateral pneumonia. Electronically Signed   By: Donavan Foil M.D.   On: 09/28/2019 23:14   DG Chest Port 1 View  Result Date: 09/24/2019 CLINICAL DATA:  COVID pneumonia, weakness, fatigue EXAM: PORTABLE CHEST 1 VIEW COMPARISON:  09/15/2019 FINDINGS: The lungs are symmetrically  expanded. Minimal left basilar atelectasis is again noted. No superimposed confluent pulmonary infiltrate. No pneumothorax or pleural effusion. Cardiac size within normal limits. The pulmonary vascularity is normal. No acute bone abnormality. IMPRESSION: Minimal left basilar atelectasis, unchanged. No superimposed confluent pulmonary infiltrate. Electronically Signed   By: Fidela Salisbury MD   On: 09/24/2019 20:15   ECHOCARDIOGRAM COMPLETE  Result Date: 09/30/2019    ECHOCARDIOGRAM REPORT   Patient Name:   MICIAH COVELLI Date of Exam: 09/30/2019 Medical Rec #:  [id number]         Height:       72.0 in Accession #:    [id number]        Weight:       237.0 lb Date of Birth:  January 20, 1956        BSA:          2.290 m Patient Age:    57 years          BP:           95/60 mmHg Patient Gender: M                 HR:           126 bpm. Exam Location:  [facility] Procedure: 2D Echo Indications:    Endocarditis I38  History:        Patient has no prior history of Echocardiogram examinations.                 Risk Factors:Non-Smoker. Pneumonia due to COVID-19 virus,  Sepsis, Lactic Acidosis.  Sonographer:    Leavy Cella RDCS (AE) Referring Phys: [id number] OLADAPO ADEFESO IMPRESSIONS  1. Left ventricular ejection fraction, by estimation, is 55 to 60%. The left ventricle has normal function. The left ventricle has no regional wall motion abnormalities. There is moderate left ventricular hypertrophy. Left ventricular diastolic parameters are indeterminate.  2. Right ventricular systolic function is normal. The right ventricular size is normal. Tricuspid regurgitation signal is inadequate for assessing PA pressure.  3. The mitral valve is grossly normal. No evidence of mitral valve regurgitation.  4. The aortic valve is tricuspid. Aortic valve regurgitation is not visualized.  5. The inferior vena cava is normal in size with <50% respiratory variability, suggesting right atrial pressure of 8 mmHg.  6. Views are  somewhat limited, but no definite valvular vegetations are visualized. FINDINGS  Left Ventricle: Left ventricular ejection fraction, by estimation, is 55 to 60%. The left ventricle has normal function. The left ventricle has no regional wall motion abnormalities. The left ventricular internal cavity size was normal in size. There is  moderate left ventricular hypertrophy. Left ventricular diastolic parameters are indeterminate. Right Ventricle: The right ventricular size is normal. No increase in right ventricular wall thickness. Right ventricular systolic function is normal. Tricuspid regurgitation signal is inadequate for assessing PA pressure. Left Atrium: Left atrial size was normal in size. Right Atrium: Right atrial size was normal in size. Pericardium: There is no evidence of pericardial effusion. Mitral Valve: The mitral valve is grossly normal. No evidence of mitral valve regurgitation. Tricuspid Valve: The tricuspid valve is grossly normal. Tricuspid valve regurgitation is trivial. Aortic Valve: The aortic valve is tricuspid. Aortic valve regurgitation is not visualized. Pulmonic Valve: The pulmonic valve was grossly normal. Pulmonic valve regurgitation is trivial. Aorta: The aortic root is normal in size and structure. Venous: The inferior vena cava is normal in size with less than 50% respiratory variability, suggesting right atrial pressure of 8 mmHg. IAS/Shunts: No atrial level shunt detected by color flow Doppler.  LEFT VENTRICLE PLAX 2D LVIDd:         4.37 cm Diastology LVIDs:         2.20 cm LV e' medial:    7.18 cm/s LV PW:         1.72 cm LV E/e' medial:  9.2 LV IVS:        1.46 cm LV e' lateral:   12.90 cm/s                        LV E/e' lateral: 5.1  RIGHT VENTRICLE RV S prime:     16.00 cm/s TAPSE (M-mode): 1.5 cm LEFT ATRIUM             Index       RIGHT ATRIUM           Index LA diam:        3.70 cm 1.62 cm/m  RA Area:     15.40 cm LA Vol (A2C):   63.9 ml 27.90 ml/m RA Volume:   39.80 ml   17.38 ml/m LA Vol (A4C):   40.6 ml 17.73 ml/m LA Biplane Vol: 52.4 ml 22.88 ml/m   AORTA Ao Root diam: 3.50 cm MITRAL VALVE MV Area (PHT): 6.60 cm MV Decel Time: 115 msec MV E velocity: 66.00 cm/s MV A velocity: 39.80 cm/s MV E/A ratio:  1.66 Rozann Lesches MD Electronically signed by Rozann Lesches MD Signature Date/Time: 09/30/2019/4:58:16 PM  Final      CBC Recent Labs  Lab 09/28/19 2239 09/29/19 0950 09/30/19 0616 10/01/19 0714  WBC 9.7 4.8 10.5 14.1*  HGB 7.3* 7.0* 9.3* 9.5*  HCT 25.5* 24.6* 31.0* 31.8*  PLT 545* 453* 519* 591*  MCV 75.4* 75.2* 76.7* 78.1*  MCH 21.6* 21.4* 23.0* 23.3*  MCHC 28.6* 28.5* 30.0 29.9*  RDW 15.9* 15.7* 16.0* 16.7*  LYMPHSABS 0.2* 0.3* 0.4* 0.2*  MONOABS 0.3 0.1 0.5 0.5  EOSABS 0.0 0.0 0.0 0.0  BASOSABS 0.0 0.0 0.0 0.0    Chemistries  Recent Labs  Lab 09/28/19 2239 09/29/19 0950 09/30/19 0616 10/01/19 0714  NA 131* 133* 133* 135  K 3.3* 4.8 3.7 4.4  CL 91* 94* 95* 93*  CO2 28 27 29 30   GLUCOSE 198* 236* 291* 375*  BUN 18 18 20  25*  CREATININE 0.90 0.73 0.70 0.83  CALCIUM 8.0* 7.9* 8.0* 8.3*  MG  --  2.4 2.3 2.4  AST 53* 59* 28 26  ALT 35 35 30 31  ALKPHOS 55 48 46 49  BILITOT 0.4 0.9 0.6 0.5   ------------------------------------------------------------------------------------------------------------------ Recent Labs    09/28/19 2239  TRIG 49    Lab Results  Component Value Date   HGBA1C 6.9 (H) 09/28/2019   ------------------------------------------------------------------------------------------------------------------ No results for input(s): TSH, T4TOTAL, T3FREE, THYROIDAB in the last 72 hours.  Invalid input(s): FREET3 ------------------------------------------------------------------------------------------------------------------ Recent Labs    09/28/19 2334 09/29/19 0950 09/30/19 0616 10/01/19 0714  FERRITIN  --    < > 95 90  TIBC 257  --   --   --   IRON 8*  --   --   --    < > = values in this  interval not displayed.    Coagulation profile No results for input(s): INR, PROTIME in the last 168 hours.  Recent Labs    09/30/19 0616 10/01/19 0714  DDIMER 1.26* 1.13*    Cardiac Enzymes No results for input(s): CKMB, TROPONINI, MYOGLOBIN in the last 168 hours.  Invalid input(s): CK ------------------------------------------------------------------------------------------------------------------ No results found for: BNP   Roxan Hockey M.D on 10/01/2019 at 4:47 PM  Go to [email or web address] - for contact info  [facility] - Office  [phone]

## 2019-10-01 NOTE — Plan of Care (Signed)
  Problem: Education: Goal: Knowledge of General Education information will improve Description: Including pain rating scale, medication(s)/side effects and non-pharmacologic comfort measures Outcome: Progressing   Problem: Health Behavior/Discharge Planning: Goal: Ability to manage health-related needs will improve Outcome: Progressing   Problem: Clinical Measurements: Goal: Ability to maintain clinical measurements within normal limits will improve Outcome: Progressing   Problem: Education: Goal: Knowledge of risk factors and measures for prevention of condition will improve Outcome: Progressing   Problem: Coping: Goal: Psychosocial and spiritual needs will be supported Outcome: Progressing   Problem: Respiratory: Goal: Will maintain a patent airway Outcome: Progressing Goal: Complications related to the disease process, condition or treatment will be avoided or minimized Outcome: Progressing

## 2019-10-02 LAB — COMPREHENSIVE METABOLIC PANEL
ALT: 34 U/L (ref 0–44)
AST: 30 U/L (ref 15–41)
Albumin: 2.3 g/dL — ABNORMAL LOW (ref 3.5–5.0)
Alkaline Phosphatase: 47 U/L (ref 38–126)
Anion gap: 10 (ref 5–15)
BUN: 31 mg/dL — ABNORMAL HIGH (ref 8–23)
CO2: 30 mmol/L (ref 22–32)
Calcium: 8.2 mg/dL — ABNORMAL LOW (ref 8.9–10.3)
Chloride: 93 mmol/L — ABNORMAL LOW (ref 98–111)
Creatinine, Ser: 0.91 mg/dL (ref 0.61–1.24)
GFR calc Af Amer: >60 mL/min (ref >60)
GFR calc non Af Amer: >60 mL/min (ref >60)
Glucose, Bld: 367 mg/dL — ABNORMAL HIGH (ref 70–99)
Potassium: 4.4 mmol/L (ref 3.5–5.1)
Sodium: 133 mmol/L — ABNORMAL LOW (ref 135–145)
Total Bilirubin: 0.6 mg/dL (ref 0.3–1.2)
Total Protein: 5.6 g/dL — ABNORMAL LOW (ref 6.5–8.1)

## 2019-10-02 LAB — CBC WITH DIFFERENTIAL/PLATELET
Abs Immature Granulocytes: 0.34 10*3/uL — ABNORMAL HIGH (ref 0.00–0.07)
Basophils Absolute: 0 10*3/uL (ref 0.0–0.1)
Basophils Relative: 0 %
Eosinophils Absolute: 0 10*3/uL (ref 0.0–0.5)
Eosinophils Relative: 0 %
HCT: 32.5 % — ABNORMAL LOW (ref 39.0–52.0)
Hemoglobin: 9.5 g/dL — ABNORMAL LOW (ref 13.0–17.0)
Immature Granulocytes: 2 %
Lymphocytes Relative: 2 %
Lymphs Abs: 0.4 10*3/uL — ABNORMAL LOW (ref 0.7–4.0)
MCH: 23.2 pg — ABNORMAL LOW (ref 26.0–34.0)
MCHC: 29.2 g/dL — ABNORMAL LOW (ref 30.0–36.0)
MCV: 79.3 fL — ABNORMAL LOW (ref 80.0–100.0)
Monocytes Absolute: 0.7 10*3/uL (ref 0.1–1.0)
Monocytes Relative: 4 %
Neutro Abs: 14.3 10*3/uL — ABNORMAL HIGH (ref 1.7–7.7)
Neutrophils Relative %: 92 %
Platelets: 577 10*3/uL — ABNORMAL HIGH (ref 150–400)
RBC: 4.1 MIL/uL — ABNORMAL LOW (ref 4.22–5.81)
RDW: 17.3 % — ABNORMAL HIGH (ref 11.5–15.5)
WBC: 15.7 10*3/uL — ABNORMAL HIGH (ref 4.0–10.5)
nRBC: 0.5 % — ABNORMAL HIGH (ref 0.0–0.2)

## 2019-10-02 LAB — GLUCOSE, CAPILLARY
Glucose-Capillary: 345 mg/dL — ABNORMAL HIGH (ref 70–99)
Glucose-Capillary: 446 mg/dL — ABNORMAL HIGH (ref 70–99)
Glucose-Capillary: 452 mg/dL — ABNORMAL HIGH (ref 70–99)
Glucose-Capillary: 381 mg/dL — ABNORMAL HIGH (ref 70–99)

## 2019-10-02 LAB — CULTURE, BLOOD (ROUTINE X 2): Special Requests: ADEQUATE

## 2019-10-02 LAB — D-DIMER, QUANTITATIVE: D-Dimer, Quant: 1.31 ug/mL-FEU — ABNORMAL HIGH (ref 0.00–0.50)

## 2019-10-02 LAB — PHOSPHORUS: Phosphorus: 3.3 mg/dL (ref 2.5–4.6)

## 2019-10-02 LAB — C-REACTIVE PROTEIN: CRP: 1.7 mg/dL — ABNORMAL HIGH (ref <1.0)

## 2019-10-02 LAB — FERRITIN: Ferritin: 67 ng/mL (ref 24–336)

## 2019-10-02 LAB — MAGNESIUM: Magnesium: 2.5 mg/dL — ABNORMAL HIGH (ref 1.7–2.4)

## 2019-10-02 MED ORDER — ALBUTEROL SULFATE HFA 108 (90 BASE) MCG/ACT IN AERS
2.0000 | INHALATION_SPRAY | Freq: Four times a day (QID) | RESPIRATORY_TRACT | Status: DC
  Administered 2019-10-02 – 2019-10-03 (×7): 2 via RESPIRATORY_TRACT

## 2019-10-02 MED ORDER — INSULIN GLARGINE 100 UNIT/ML ~~LOC~~ SOLN
28.0000 [IU] | Freq: Every day | SUBCUTANEOUS | Status: DC
  Administered 2019-10-02: 28 [IU] via SUBCUTANEOUS
  Filled 2019-10-02 (×2): qty 0.28

## 2019-10-02 MED ORDER — INSULIN ASPART 100 UNIT/ML ~~LOC~~ SOLN
34.0000 [IU] | Freq: Once | SUBCUTANEOUS | Status: AC
  Administered 2019-10-02: 34 [IU] via SUBCUTANEOUS

## 2019-10-02 MED ORDER — INSULIN GLARGINE 100 UNIT/ML ~~LOC~~ SOLN: 16.0000 [IU] | Freq: Every day | SUBCUTANEOUS | Status: DC

## 2019-10-02 MED ORDER — INSULIN ASPART 100 UNIT/ML ~~LOC~~ SOLN
28.0000 [IU] | Freq: Once | SUBCUTANEOUS | Status: AC
  Administered 2019-10-02: 28 [IU] via SUBCUTANEOUS

## 2019-10-02 NOTE — Progress Notes (Addendum)
Patient Demographics:    Stephen Wilcox, is a 64 y.o. male, DOB - 04-03-1955, IRW:[id number]  Admit date - 09/28/2019   Admitting Physician Jamiee Milholland Denton Brick, MD  Outpatient Primary MD for the patient is Scherrie Bateman  LOS - 4   Chief Complaint  Patient presents with  . 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 who is currently hospitalized with COVID-19 pneumonia - patient and daughter are agreeable to SNF placement, awaiting physical therapy eval and social work input  -Last dose of iv Remdesevir is 10/03/19-----should be able to dc to SNF on oxygen on 10/03/19    Assessment  & Plan :    Active Problems:   Pneumonia due to COVID-19 virus   Acute respiratory failure with hypoxia (HCC)   Sepsis (HCC)   Hyperglycemia   Thrombocytosis (HCC)   Hyponatremia   Hypokalemia   Microcytic anemia   GI bleed   Symptomatic anemia   Lactic acidosis   Acute respiratory disease due to COVID-19 virus  Brief Summary:-  64 y.o. male with no pertinent medical history who presents to the emergency department due to increased weakness in the setting of COVID-19 virus infection -Patient is unvaccinated against COVID-19 virus -Patient tested positive on 09/24/2019 -He was offered outpatient monoclonal antibody infusion on 09/25/2019 which he declined--- please see documentation from nurse practitioner Faythe Casa on 09/25/19  -Patient readmitted on 09/28/2019 with worsening symptoms respiratory symptoms with hypoxia and chest x-ray consistent with Covid pneumonia   and also found to have heme positive stools and  symptomatic anemia as well - Last dose of iv Remdesevir is 10/03/19-----should be able to dc to SNF on oxygen on 10/03/19   A/p 1)Acute hypoxic respiratory failure secondary to COVID-19 infection/Pneumonia--- The treatment plan and use of medications  for treatment of COVID-19 infection and possible side effects were discussed with patient/family 10/02/19--Cough, shortness of breath and persistent hypoxia persist -----Patient/Family verbalizes understanding and agrees to treatment protocols  --Patient is positive for COVID-19 infection, chest x-ray with findings of infiltrates/opacities,  patient is tachypneic/hypoxic and requiring continuous supplemental oxygen---patient meets criteria for initiation of Remdesivir AND Steroid therapy per protocol  D-Dimer 1.89 >> 1.48>>1.26>> 1.13>>1.31 Ferritin 59>>70>>95>>90>>67 CRP 17.4 >> 15.8>>7.9>>3.6>>1.7 -Fibrinogen is 698 PCT 0.17 LDH 304 --Check and trend inflammatory markers including D-dimer, ferritin and  CRP---also follow CBC and CMP --Supplemental oxygen to keep O2 sats above 93% -Follow serial chest x-rays and ABGs as indicated --- Encourage prone positioning for More than 16 hours/day in increments of 2 to 3 hours at a time if able to tolerate --Attempt to maintain euvolemic state --Zinc and vitamin C as ordered -Albuterol inhaler as needed -Accu-Cheks/fingersticks while on high-dose steroids -PPI while on high-dose steroids -Continue IV remdesivir and IV steroids started on 09/29/2019   2)Acute GI bleed--GI consult appreciated,  -continue  Protonix,  patient received 2 units of PRBC -Patient admits to recent NSAID use on a  regular basis due to COVID-19 type myalgias -Outpatient endoluminal evaluation advised unless patient decompensates while inpatient  3)Presumed symptomatic acute blood loss anemia/iron deficiency anemia --- hemoglobin stable around 9.5 from 7.0 after transfusion of 2 units of PRBCs -Ferritin is not low most likely  due to COVID-19 infection -Serum iron is 8 with iron saturation of 3 --Patient denies any history of anemia until recently -Transfuse as above #2 -Endoluminal evaluation as noted above #2  4)History of BPH--- resume Flomax  5)Class II obesity-this increases overall morbidity and mortality -Low calorie diet, portion control and increase physical activity discussed with patient after resolution of acute respiratory symptoms -Body mass index is 32.14 kg/m.  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 who is currently hospitalized with COVID-19 pneumonia - patient and daughter are agreeable to SNF placement, awaiting physical therapy eval and social work input  Disposition/Need for in-Hospital Stay- patient unable to be discharged at this time due to acute hypoxic respiratory failure secondary to Covid pneumonia requiring IV remdesivir and IV steroids and supplemental oxygen as well as GI bleed with drop in H&H requiring transfusion and close monitoring Last dose of iv Remdesevir is 10/03/19-----should be able to dc to SNF on oxygen on 10/03/19  Status is: Inpatient  Remains inpatient appropriate because:Last dose of iv Remdesevir is 10/03/19-----should be able to dc to SNF on oxygen on 10/03/19  Disposition: The patient is from: Home              Anticipated d/c is to: SNF              Anticipated d/c date is: 1 day              Patient currently is not medically stable to d/c. Barriers: Not Clinically Stable- Last dose of iv  Remdesevir is 10/03/19-----should be able to dc to SNF on oxygen on 10/03/19 -Anticipate discharge to SNF rehab  Code Status : full code  Family Communication:   (patient is alert, awake and coherent)  Discussed with daughter [name] [phone]  Consults  :  Gi   DVT Prophylaxis  :    - SCDs /GI bleed  Lab Results  Component Value Date   PLT 577 (H) 10/02/2019    Inpatient Medications  Scheduled Meds: . sodium chloride   Intravenous Once  . albuterol  2 puff Inhalation Q6H WA  . vitamin C  500 mg Oral Daily  . diltiazem  30 mg Oral Q6H  . insulin aspart  0-20 Units Subcutaneous TID WC  . insulin aspart  0-5 Units Subcutaneous QHS  . insulin aspart  34 Units Subcutaneous Once  . insulin aspart  6 Units Subcutaneous TID WC  . insulin glargine  28 Units Subcutaneous QHS  . methylPREDNISolone (SOLU-MEDROL) injection  60 mg Intravenous Q12H  . metoprolol tartrate  25 mg Oral TID  . pantoprazole  40 mg Intravenous Q12H  . tamsulosin  0.4 mg Oral QPC supper  . zinc sulfate  220 mg Oral Daily   Continuous Infusions: . remdesivir 100 mg in NS 100 mL 100 mg (10/02/19 0942)   PRN Meds:.chlorpheniramine-HYDROcodone, guaiFENesin-dextromethorphan  Anti-infectives (From admission, onward)   Start     Dose/Rate Route Frequency Ordered Stop   09/30/19 2000  vancomycin (VANCOCIN) IVPB 1000 mg/200 mL premix       "Followed by" Linked Group Details   1,000 mg 200 mL/hr over 60 Minutes Intravenous Every 12 hours 09/30/19 0751 09/30/19 2158   09/30/19 1000  remdesivir 100 mg in sodium chloride 0.9 % 100 mL IVPB       "Followed by" Linked Group Details   100 mg 200 mL/hr over 30 Minutes Intravenous Daily 09/28/19 2358 10/04/19 0959   09/30/19 0800  vancomycin (VANCOREADY) IVPB 2000 mg/400 mL       "Followed by" Linked Group Details   2,000 mg 200 mL/hr over 120 Minutes Intravenous  Once 09/30/19 0751 09/30/19 1158   09/29/19 1000  remdesivir 100 mg in sodium chloride 0.9 %  100 mL IVPB  Status:  Discontinued       "Followed by" Linked Group Details   100 mg 200 mL/hr over 30 Minutes Intravenous Daily 09/28/19 2353 09/28/19 2358   09/29/19 0000  remdesivir 200 mg in sodium chloride 0.9% 250 mL IVPB  Status:  Discontinued       "Followed by" Linked Group Details   200 mg 580 mL/hr over 30 Minutes Intravenous Once 09/28/19 2353 09/28/19 2358   09/29/19 0000  remdesivir 100 mg in sodium chloride 0.9 % 100 mL IVPB       "Followed by" Linked Group Details   100 mg 200 mL/hr over 30 Minutes Intravenous Every 30 min 09/28/19 2358 09/29/19 1145        Objective:   Vitals:   10/01/19 2140 10/02/19 0520 10/02/19 0748 10/02/19 1427  BP:  113/78  122/76  Pulse:  99  (!) 102  Resp:  18  19  Temp:    97.6 F (36.4 C)  TempSrc:    Oral  SpO2: 91% 92% (!) 87% (!) 89%  Weight:      Height:        Wt Readings from Last 3 Encounters:  09/28/19 107.5 kg  09/24/19 107.5 kg  08/15/18 108.9 kg    No intake or output data in the 24 hours ending 10/02/19 1743 Physical Exam  Gen:- Awake Alert, some conversational dyspnea HEENT:- [place].AT, No sclera icterus Nose- [place] 4L/min Neck-Supple Neck,No JVD,.  Lungs-improving air movement, no wheezing CV- S1, S2 normal, regular  Abd-  +ve B.Sounds, Abd Soft, No tenderness,    Extremity/Skin:- No  edema, pedal pulses present Psych-affect is appropriate, oriented x3 Neuro-generalized weakness ,no new focal deficits, no tremors   Data Review:   Micro Results Recent Results (from the past 240 hour(s))  SARS Coronavirus 2 by RT PCR (hospital order, performed in [facility] hospital lab) Nasopharyngeal Nasopharyngeal Swab     Status: Abnormal   Collection Time: 09/24/19 11:24 AM   Specimen: Nasopharyngeal Swab  Result Value Ref Range Status   SARS Coronavirus 2 POSITIVE (A) NEGATIVE Final    Comment: RESULT CALLED TO, READ BACK BY AND VERIFIED WITH: SANDRA RN @ 1540 ON [id number] BY HENDERSON L (NOTE) SARS-CoV-2 target  nucleic acids are DETECTED  SARS-CoV-2 RNA is generally detectable in upper respiratory specimens  during the acute phase of infection.  Positive results are indicative  of the presence of the identified virus, but do not rule out bacterial infection or co-infection with other pathogens not detected by the test.  Clinical correlation with patient history and  other diagnostic information  is necessary to determine patient infection status.  The expected result is negative.  Fact Sheet for Patients:   StrictlyIdeas.no   Fact Sheet for Healthcare Providers:   BankingDealers.co.za    This test is not yet approved or cleared by the [place] FDA and  has been authorized for detection and/or diagnosis of SARS-CoV-2 by FDA under an Emergency Use Authorization (EUA).  This EUA will remain in effect (meaning  this test can be used) for the duration of  the COVID-19 declaration under Section 564(b)(1) of the Act, 21 U.S.C. section 360-bbb-3(b)(1), unless the authorization is terminated or revoked sooner.  Performed at [facility], [address], [place], [place] [address]   Blood Culture (routine x 2)     Status: Abnormal   Collection Time: 09/28/19 11:19 PM   Specimen: BLOOD  Result Value Ref Range Status   Specimen Description   Final    BLOOD LEFT ANTECUBITAL Performed at [facility], [address] [place], [place] [address]    Special Requests   Final    BOTTLES DRAWN AEROBIC AND ANAEROBIC Blood Culture adequate volume Performed at [facility], [address], [place], [place] [address]    Culture  Setup Time   Final    ANAEROBIC BOTTLE ONLY GRAM POSITIVE COCCI Gram Stain Report Called to,Read Back By and Verified With: T EASTER,RN@2206  09/29/19 MKELLY CRITICAL RESULT CALLED TO, READ BACK BY AND VERIFIED WITH: K NICHOLS RN 09/30/19 0611 JDW    Culture (A)  Final    STAPHYLOCOCCUS EPIDERMIDIS THE SIGNIFICANCE OF ISOLATING THIS  ORGANISM FROM A SINGLE SET OF BLOOD CULTURES WHEN MULTIPLE SETS ARE DRAWN IS UNCERTAIN. PLEASE NOTIFY THE MICROBIOLOGY DEPARTMENT WITHIN ONE WEEK IF SPECIATION AND SENSITIVITIES ARE REQUIRED. Performed at [facility], [place] [address], [place], [place] [address]    Report Status 10/02/2019 FINAL  Final  Blood Culture ID Panel (Reflexed)     Status: Abnormal   Collection Time: 09/28/19 11:19 PM  Result Value Ref Range Status   Enterococcus faecalis NOT DETECTED NOT DETECTED Final   Enterococcus Faecium NOT DETECTED NOT DETECTED Final   Listeria monocytogenes NOT DETECTED NOT DETECTED Final   Staphylococcus species DETECTED (A) NOT DETECTED Final    Comment: CRITICAL RESULT CALLED TO, READ BACK BY AND VERIFIED WITH: Merideth Abbey RN 09/30/19 6384 JDW    Staphylococcus aureus (BCID) NOT DETECTED NOT DETECTED Final   Staphylococcus epidermidis DETECTED (A) NOT DETECTED Final    Comment: Methicillin (oxacillin) resistant coagulase negative staphylococcus. Possible blood culture contaminant (unless isolated from more than one blood culture draw or clinical case suggests pathogenicity). No antibiotic treatment is indicated for blood  culture contaminants. CRITICAL RESULT CALLED TO, READ BACK BY AND VERIFIED WITH: K NICHOLS RN 09/30/19 6659 JDW    Staphylococcus lugdunensis NOT DETECTED NOT DETECTED Final   Streptococcus species NOT DETECTED NOT DETECTED Final   Streptococcus agalactiae NOT DETECTED NOT DETECTED Final   Streptococcus pneumoniae NOT DETECTED NOT DETECTED Final   Streptococcus pyogenes NOT DETECTED NOT DETECTED Final   A.calcoaceticus-baumannii NOT DETECTED NOT DETECTED Final   Bacteroides fragilis NOT DETECTED NOT DETECTED Final   Enterobacterales NOT DETECTED NOT DETECTED Final   Enterobacter cloacae complex NOT DETECTED NOT DETECTED Final   Escherichia coli NOT DETECTED NOT DETECTED Final   Klebsiella aerogenes NOT DETECTED NOT DETECTED Final   Klebsiella oxytoca NOT DETECTED  NOT DETECTED Final   Klebsiella pneumoniae NOT DETECTED NOT DETECTED Final   Proteus species NOT DETECTED  NOT DETECTED Final   Salmonella species NOT DETECTED NOT DETECTED Final   Serratia marcescens NOT DETECTED NOT DETECTED Final   Haemophilus influenzae NOT DETECTED NOT DETECTED Final   Neisseria meningitidis NOT DETECTED NOT DETECTED Final   Pseudomonas aeruginosa NOT DETECTED NOT DETECTED Final   Stenotrophomonas maltophilia NOT DETECTED NOT DETECTED Final   Candida albicans NOT DETECTED NOT DETECTED Final   Candida auris NOT DETECTED NOT DETECTED Final   Candida glabrata NOT DETECTED NOT DETECTED Final   Candida krusei NOT DETECTED NOT DETECTED Final   Candida parapsilosis NOT DETECTED NOT DETECTED Final   Candida tropicalis NOT DETECTED NOT DETECTED Final   Cryptococcus neoformans/gattii NOT DETECTED NOT DETECTED Final   Methicillin resistance mecA/C DETECTED (A) NOT DETECTED Final    Comment: CRITICAL RESULT CALLED TO, READ BACK BY AND VERIFIED WITH: Merideth Abbey RN 09/30/19 0932 JDW Performed at [facility], [address], [place],  [address]   Blood Culture (routine x 2)     Status: None (Preliminary result)   Collection Time: 09/28/19 11:34 PM   Specimen: BLOOD  Result Value Ref Range Status   Specimen Description BLOOD RIGHT ANTECUBITAL  Final   Special Requests   Final    BOTTLES DRAWN AEROBIC AND ANAEROBIC Blood Culture adequate volume   Culture   Final    NO GROWTH 3 DAYS Performed at [facility], [address] [place],  [address]    Report Status PENDING  Incomplete    Radiology Reports DG Chest 2 View  Result Date: 09/15/2019 CLINICAL DATA:  Acute chest pain. EXAM: CHEST - 2 VIEW COMPARISON:  None. FINDINGS: The heart size and mediastinal contours are within normal limits. No pneumothorax or pleural effusion is noted. Right lung is clear. Minimal left basilar subsegmental atelectasis or scarring is noted. The visualized skeletal structures  are unremarkable. IMPRESSION: Minimal left basilar subsegmental atelectasis or scarring. Electronically Signed   By: Marijo Conception M.D.   On: 09/15/2019 16:58   DG Ribs Unilateral Right  Result Date: 09/15/2019 CLINICAL DATA:  Acute right chest pain. EXAM: RIGHT RIBS - 2 VIEW COMPARISON:  None. FINDINGS: No fracture or other bone lesions are seen involving the ribs. IMPRESSION: Negative. Electronically Signed   By: Marijo Conception M.D.   On: 09/15/2019 16:57   DG Chest Port 1 View  Result Date: 09/28/2019 CLINICAL DATA:  COVID EXAM: PORTABLE CHEST 1 VIEW COMPARISON:  09/24/2019 FINDINGS: Development of patchy bilateral airspace opacities. No pleural effusion. Stable cardiomediastinal silhouette. No pneumothorax. IMPRESSION: Development of patchy bilateral airspace opacities, consistent with bilateral pneumonia. Electronically Signed   By: Donavan Foil M.D.   On: 09/28/2019 23:14   DG Chest Port 1 View  Result Date: 09/24/2019 CLINICAL DATA:  COVID pneumonia, weakness, fatigue EXAM: PORTABLE CHEST 1 VIEW COMPARISON:  09/15/2019 FINDINGS: The lungs are symmetrically expanded. Minimal left basilar atelectasis is again noted. No superimposed confluent pulmonary infiltrate. No pneumothorax or pleural effusion. Cardiac size within normal limits. The pulmonary vascularity is normal. No acute bone abnormality. IMPRESSION: Minimal left basilar atelectasis, unchanged. No superimposed confluent pulmonary infiltrate. Electronically Signed   By: Fidela Salisbury MD   On: 09/24/2019 20:15   ECHOCARDIOGRAM COMPLETE  Result Date: 09/30/2019    ECHOCARDIOGRAM REPORT   Patient Name:   TALLIE HEVIA Date of Exam: 09/30/2019 Medical Rec #:  [id number]         Height:       72.0 in Accession #:  [id number]        Weight:       237.0 lb Date of Birth:  [birth year]        BSA:          2.290 m Patient Age:    60 years          BP:           95/60 mmHg Patient Gender: M                 HR:           126 bpm. Exam  Location:  [facility] Procedure: 2D Echo Indications:    Endocarditis I38  History:        Patient has no prior history of Echocardiogram examinations.                 Risk Factors:Non-Smoker. Pneumonia due to COVID-19 virus,                 Sepsis, Lactic Acidosis.  Sonographer:    Leavy Cella RDCS (AE) Referring Phys: [id number] OLADAPO ADEFESO IMPRESSIONS  1. Left ventricular ejection fraction, by estimation, is 55 to 60%. The left ventricle has normal function. The left ventricle has no regional wall motion abnormalities. There is moderate left ventricular hypertrophy. Left ventricular diastolic parameters are indeterminate.  2. Right ventricular systolic function is normal. The right ventricular size is normal. Tricuspid regurgitation signal is inadequate for assessing PA pressure.  3. The mitral valve is grossly normal. No evidence of mitral valve regurgitation.  4. The aortic valve is tricuspid. Aortic valve regurgitation is not visualized.  5. The inferior vena cava is normal in size with <50% respiratory variability, suggesting right atrial pressure of 8 mmHg.  6. Views are somewhat limited, but no definite valvular vegetations are visualized. FINDINGS  Left Ventricle: Left ventricular ejection fraction, by estimation, is 55 to 60%. The left ventricle has normal function. The left ventricle has no regional wall motion abnormalities. The left ventricular internal cavity size was normal in size. There is  moderate left ventricular hypertrophy. Left ventricular diastolic parameters are indeterminate. Right Ventricle: The right ventricular size is normal. No increase in right ventricular wall thickness. Right ventricular systolic function is normal. Tricuspid regurgitation signal is inadequate for assessing PA pressure. Left Atrium: Left atrial size was normal in size. Right Atrium: Right atrial size was normal in size. Pericardium: There is no evidence of pericardial effusion. Mitral Valve: The mitral valve  is grossly normal. No evidence of mitral valve regurgitation. Tricuspid Valve: The tricuspid valve is grossly normal. Tricuspid valve regurgitation is trivial. Aortic Valve: The aortic valve is tricuspid. Aortic valve regurgitation is not visualized. Pulmonic Valve: The pulmonic valve was grossly normal. Pulmonic valve regurgitation is trivial. Aorta: The aortic root is normal in size and structure. Venous: The inferior vena cava is normal in size with less than 50% respiratory variability, suggesting right atrial pressure of 8 mmHg. IAS/Shunts: No atrial level shunt detected by color flow Doppler.  LEFT VENTRICLE PLAX 2D LVIDd:         4.37 cm Diastology LVIDs:         2.20 cm LV e' medial:    7.18 cm/s LV PW:         1.72 cm LV E/e' medial:  9.2 LV IVS:        1.46 cm LV e' lateral:   12.90 cm/s  LV E/e' lateral: 5.1  RIGHT VENTRICLE RV S prime:     16.00 cm/s TAPSE (M-mode): 1.5 cm LEFT ATRIUM             Index       RIGHT ATRIUM           Index LA diam:        3.70 cm 1.62 cm/m  RA Area:     15.40 cm LA Vol (A2C):   63.9 ml 27.90 ml/m RA Volume:   39.80 ml  17.38 ml/m LA Vol (A4C):   40.6 ml 17.73 ml/m LA Biplane Vol: 52.4 ml 22.88 ml/m   AORTA Ao Root diam: 3.50 cm MITRAL VALVE MV Area (PHT): 6.60 cm MV Decel Time: 115 msec MV E velocity: 66.00 cm/s MV A velocity: 39.80 cm/s MV E/A ratio:  1.66 Rozann Lesches MD Electronically signed by Rozann Lesches MD Signature Date/Time: 09/30/2019/4:58:16 PM    Final      CBC Recent Labs  Lab 09/28/19 2239 09/29/19 0950 09/30/19 0616 10/01/19 0714 10/02/19 0742  WBC 9.7 4.8 10.5 14.1* 15.7*  HGB 7.3* 7.0* 9.3* 9.5* 9.5*  HCT 25.5* 24.6* 31.0* 31.8* 32.5*  PLT 545* 453* 519* 591* 577*  MCV 75.4* 75.2* 76.7* 78.1* 79.3*  MCH 21.6* 21.4* 23.0* 23.3* 23.2*  MCHC 28.6* 28.5* 30.0 29.9* 29.2*  RDW 15.9* 15.7* 16.0* 16.7* 17.3*  LYMPHSABS 0.2* 0.3* 0.4* 0.2* 0.4*  MONOABS 0.3 0.1 0.5 0.5 0.7  EOSABS 0.0 0.0 0.0 0.0 0.0  BASOSABS  0.0 0.0 0.0 0.0 0.0    Chemistries  Recent Labs  Lab 09/28/19 2239 09/29/19 0950 09/30/19 0616 10/01/19 0714 10/02/19 0742  NA 131* 133* 133* 135 133*  K 3.3* 4.8 3.7 4.4 4.4  CL 91* 94* 95* 93* 93*  CO2 28 27 29 30 30   GLUCOSE 198* 236* 291* 375* 367*  BUN 18 18 20  25* 31*  CREATININE 0.90 0.73 0.70 0.83 0.91  CALCIUM 8.0* 7.9* 8.0* 8.3* 8.2*  MG  --  2.4 2.3 2.4 2.5*  AST 53* 59* 28 26 30   ALT 35 35 30 31 34  ALKPHOS 55 48 46 49 47  BILITOT 0.4 0.9 0.6 0.5 0.6   ------------------------------------------------------------------------------------------------------------------ No results for input(s): CHOL, HDL, LDLCALC, TRIG, CHOLHDL, LDLDIRECT in the last 72 hours.  Lab Results  Component Value Date   HGBA1C 6.9 (H) 09/28/2019   ------------------------------------------------------------------------------------------------------------------ No results for input(s): TSH, T4TOTAL, T3FREE, THYROIDAB in the last 72 hours.  Invalid input(s): FREET3 ------------------------------------------------------------------------------------------------------------------ Recent Labs    10/01/19 0714 10/02/19 0742  FERRITIN 90 67    Coagulation profile No results for input(s): INR, PROTIME in the last 168 hours.  Recent Labs    10/01/19 0714 10/02/19 0742  DDIMER 1.13* 1.31*    Cardiac Enzymes No results for input(s): CKMB, TROPONINI, MYOGLOBIN in the last 168 hours.  Invalid input(s): CK ------------------------------------------------------------------------------------------------------------------ No results found for: BNP  Roxan Hockey M.D on 10/02/2019 at 5:43 PM  Go to [email or web address] - for contact info  [facility] - Office  [phone]

## 2019-10-02 NOTE — Progress Notes (Signed)
CRITICAL VALUE ALERT  Critical Value:  CBG 452  Date & Time Notied:  10/02/2019 @ 1600  Provider Notified: Denton Brick  Orders Received/Actions taken:  Provider ordered 1 time dose of 34 units of NovoLog.  See MAR.

## 2019-10-02 NOTE — Progress Notes (Signed)
CRITICAL VALUE ALERT  Critical Value:  CBG 446  Date & Time Notied:  09/112/2021 @ 1792  Provider Notified: Denton Brick  Orders Received/Actions taken:  Give 28 units of NovoLog (See MAR).

## 2019-10-03 ENCOUNTER — Inpatient Hospital Stay (HOSPITAL_COMMUNITY): Payer: 59

## 2019-10-03 DIAGNOSIS — I4891 Unspecified atrial fibrillation: Secondary | ICD-10-CM

## 2019-10-03 LAB — COMPREHENSIVE METABOLIC PANEL
ALT: 43 U/L (ref 0–44)
AST: 34 U/L (ref 15–41)
Albumin: 2.5 g/dL — ABNORMAL LOW (ref 3.5–5.0)
Alkaline Phosphatase: 50 U/L (ref 38–126)
Anion gap: 11 (ref 5–15)
BUN: 41 mg/dL — ABNORMAL HIGH (ref 8–23)
CO2: 29 mmol/L (ref 22–32)
Calcium: 8.4 mg/dL — ABNORMAL LOW (ref 8.9–10.3)
Chloride: 94 mmol/L — ABNORMAL LOW (ref 98–111)
Creatinine, Ser: 0.93 mg/dL (ref 0.61–1.24)
GFR calc Af Amer: >60 mL/min (ref >60)
GFR calc non Af Amer: >60 mL/min (ref >60)
Glucose, Bld: 404 mg/dL — ABNORMAL HIGH (ref 70–99)
Potassium: 4.3 mmol/L (ref 3.5–5.1)
Sodium: 134 mmol/L — ABNORMAL LOW (ref 135–145)
Total Bilirubin: 0.8 mg/dL (ref 0.3–1.2)
Total Protein: 5.7 g/dL — ABNORMAL LOW (ref 6.5–8.1)

## 2019-10-03 LAB — CBC WITH DIFFERENTIAL/PLATELET
Abs Immature Granulocytes: 0.44 10*3/uL — ABNORMAL HIGH (ref 0.00–0.07)
Basophils Absolute: 0.1 10*3/uL (ref 0.0–0.1)
Basophils Relative: 0 %
Eosinophils Absolute: 0 10*3/uL (ref 0.0–0.5)
Eosinophils Relative: 0 %
HCT: 33 % — ABNORMAL LOW (ref 39.0–52.0)
Hemoglobin: 9.9 g/dL — ABNORMAL LOW (ref 13.0–17.0)
Immature Granulocytes: 2 %
Lymphocytes Relative: 2 %
Lymphs Abs: 0.3 10*3/uL — ABNORMAL LOW (ref 0.7–4.0)
MCH: 23.6 pg — ABNORMAL LOW (ref 26.0–34.0)
MCHC: 30 g/dL (ref 30.0–36.0)
MCV: 78.8 fL — ABNORMAL LOW (ref 80.0–100.0)
Monocytes Absolute: 0.8 10*3/uL (ref 0.1–1.0)
Monocytes Relative: 4 %
Neutro Abs: 18.9 10*3/uL — ABNORMAL HIGH (ref 1.7–7.7)
Neutrophils Relative %: 92 %
Platelets: 514 10*3/uL — ABNORMAL HIGH (ref 150–400)
RBC: 4.19 MIL/uL — ABNORMAL LOW (ref 4.22–5.81)
RDW: 17.9 % — ABNORMAL HIGH (ref 11.5–15.5)
WBC: 20.5 10*3/uL — ABNORMAL HIGH (ref 4.0–10.5)
nRBC: 0.7 % — ABNORMAL HIGH (ref 0.0–0.2)

## 2019-10-03 LAB — GLUCOSE, CAPILLARY
Glucose-Capillary: 357 mg/dL — ABNORMAL HIGH (ref 70–99)
Glucose-Capillary: 463 mg/dL — ABNORMAL HIGH (ref 70–99)
Glucose-Capillary: 390 mg/dL — ABNORMAL HIGH (ref 70–99)
Glucose-Capillary: 309 mg/dL — ABNORMAL HIGH (ref 70–99)

## 2019-10-03 LAB — TSH: TSH: 0.733 u[IU]/mL (ref 0.350–4.500)

## 2019-10-03 LAB — PHOSPHORUS: Phosphorus: 3.4 mg/dL (ref 2.5–4.6)

## 2019-10-03 LAB — T4, FREE: Free T4: 0.98 ng/dL (ref 0.61–1.12)

## 2019-10-03 LAB — CULTURE, BLOOD (ROUTINE X 2)
Culture: NO GROWTH
Special Requests: ADEQUATE

## 2019-10-03 LAB — FERRITIN: Ferritin: 53 ng/mL (ref 24–336)

## 2019-10-03 LAB — MAGNESIUM: Magnesium: 2.6 mg/dL — ABNORMAL HIGH (ref 1.7–2.4)

## 2019-10-03 LAB — TROPONIN I (HIGH SENSITIVITY)
Troponin I (High Sensitivity): 2 ng/L (ref <18)
Troponin I (High Sensitivity): 2 ng/L (ref <18)

## 2019-10-03 LAB — D-DIMER, QUANTITATIVE: D-Dimer, Quant: 1.48 ug/mL-FEU — ABNORMAL HIGH (ref 0.00–0.50)

## 2019-10-03 LAB — C-REACTIVE PROTEIN: CRP: 1 mg/dL — ABNORMAL HIGH (ref <1.0)

## 2019-10-03 MED ORDER — INSULIN ASPART 100 UNIT/ML ~~LOC~~ SOLN
16.0000 [IU] | Freq: Three times a day (TID) | SUBCUTANEOUS | Status: DC
  Administered 2019-10-03 – 2019-10-04 (×3): 16 [IU] via SUBCUTANEOUS

## 2019-10-03 MED ORDER — INSULIN ASPART 100 UNIT/ML ~~LOC~~ SOLN
30.0000 [IU] | Freq: Once | SUBCUTANEOUS | Status: AC
  Administered 2019-10-03: 30 [IU] via SUBCUTANEOUS

## 2019-10-03 MED ORDER — ALBUTEROL SULFATE HFA 108 (90 BASE) MCG/ACT IN AERS
2.0000 | INHALATION_SPRAY | Freq: Three times a day (TID) | RESPIRATORY_TRACT | Status: DC
  Administered 2019-10-04 – 2019-10-10 (×19): 2 via RESPIRATORY_TRACT

## 2019-10-03 MED ORDER — INSULIN GLARGINE 100 UNIT/ML ~~LOC~~ SOLN
50.0000 [IU] | Freq: Every day | SUBCUTANEOUS | Status: DC
  Administered 2019-10-03 – 2019-10-08 (×6): 50 [IU] via SUBCUTANEOUS
  Filled 2019-10-03 (×7): qty 0.5

## 2019-10-03 MED ORDER — IOHEXOL 350 MG/ML SOLN
100.0000 mL | Freq: Once | INTRAVENOUS | Status: AC | PRN
  Administered 2019-10-03: 100 mL via INTRAVENOUS

## 2019-10-03 MED ORDER — DILTIAZEM HCL ER COATED BEADS 120 MG PO CP24
120.0000 mg | ORAL_CAPSULE | Freq: Every day | ORAL | Status: DC
  Administered 2019-10-03 – 2019-10-12 (×10): 120 mg via ORAL
  Filled 2019-10-03 (×10): qty 1

## 2019-10-03 MED ORDER — GADOBUTROL 1 MMOL/ML IV SOLN
10.0000 mL | Freq: Once | INTRAVENOUS | Status: AC | PRN
  Administered 2019-10-03: 10 mL via INTRAVENOUS

## 2019-10-03 MED ORDER — METHYLPREDNISOLONE SODIUM SUCC 125 MG IJ SOLR
125.0000 mg | Freq: Two times a day (BID) | INTRAMUSCULAR | Status: DC
  Administered 2019-10-03 – 2019-10-06 (×6): 125 mg via INTRAVENOUS
  Filled 2019-10-03 (×6): qty 2

## 2019-10-03 NOTE — NC FL2 (Signed)
[place] LEVEL OF CARE SCREENING TOOL     IDENTIFICATION  Patient Name: Stephen Wilcox Birthdate: 1956/01/07 Sex: male Admission Date (Current Location): 09/28/2019  [facility] [place] Number:  [employer] and Address:  [place] [address] [place]      Provider Number: [id number]  Attending Physician Name and Address:  Orson Eva, MD  Relative Name and Phone Number:       Current Level of Care: Hospital Recommended Level of Care: [place] Prior Approval Number:    Date Approved/Denied:   PASRR Number: [id number]  Discharge Plan: SNF    Current Diagnoses: Patient Active Problem List   Diagnosis Date Noted  . Atrial fibrillation with RVR ([place]) 10/03/2019  . Acute respiratory failure with hypoxia ([place]) 09/29/2019  . Sepsis ([place]) 09/29/2019  . Hyperglycemia 09/29/2019  . Thrombocytosis ([place]) 09/29/2019  . Hyponatremia 09/29/2019  . Hypokalemia 09/29/2019  . Microcytic anemia 09/29/2019  . GI bleed 09/29/2019  . Symptomatic anemia 09/29/2019  . Lactic acidosis 09/29/2019  . Acute respiratory disease due to COVID-19 virus 09/29/2019  . Pneumonia due to COVID-19 virus 09/28/2019    Orientation RESPIRATION BLADDER Height & Weight     Self, Time, Situation, Place  O2 (6L) External catheter Weight: 237 lb (107.5 kg) Height:  6' (182.9 cm)  BEHAVIORAL SYMPTOMS/MOOD NEUROLOGICAL BOWEL NUTRITION STATUS      Incontinent Diet (Carb modified. See d/c summary for updates.)  AMBULATORY STATUS COMMUNICATION OF NEEDS Skin   Extensive Assist Verbally Normal                       Personal Care Assistance Level of Assistance  Bathing, Feeding, Dressing Bathing Assistance: Maximum assistance Feeding assistance: Limited assistance Dressing Assistance: Maximum assistance     Functional Limitations Info  Sight, Hearing, Speech Sight Info: Adequate Hearing Info: Adequate Speech Info: Adequate     SPECIAL CARE FACTORS FREQUENCY  PT (By licensed PT)     PT Frequency: daily              Contractures Contractures Info: Not present    Additional Factors Info  Code Status, Allergies, Isolation Precautions Code Status Info: Full code Allergies Info: No known allergies     Isolation Precautions Info: Airborne and contact precautions due to COVID +     Current Medications (10/03/2019):  This is the current hospital active medication list Current Facility-Administered Medications  Medication Dose Route Frequency Provider Last Rate Last Admin  . 0.9 %  sodium chloride infusion (Manually program via Guardrails IV Fluids)   Intravenous Once Lang Snow, FNP 10 mL/hr at 09/30/19 2054 Restarted at 09/30/19 2054  . albuterol (VENTOLIN HFA) 108 (90 Base) MCG/ACT inhaler 2 puff  2 puff Inhalation Q6H WA Adefeso, Oladapo, DO   2 puff at 10/03/19 1218  . ascorbic acid (VITAMIN C) tablet 500 mg  500 mg Oral Daily Adefeso, Oladapo, DO   500 mg at 10/03/19 0829  . chlorpheniramine-HYDROcodone (TUSSIONEX) 10-8 MG/5ML suspension 5 mL  5 mL Oral Q12H PRN Adefeso, Oladapo, DO   5 mL at 10/02/19 2237  . diltiazem (CARDIZEM CD) 24 hr capsule 120 mg  120 mg Oral Daily Tat, David, MD   120 mg at 10/03/19 1217  . guaiFENesin-dextromethorphan (ROBITUSSIN DM) 100-10 MG/5ML syrup 10 mL  10 mL Oral Q4H PRN Adefeso, Oladapo, DO   10 mL at 09/29/19 0050  . insulin aspart (novoLOG) injection  0-20 Units  0-20 Units Subcutaneous TID WC Emokpae, Courage, MD   20 Units at 10/03/19 0830  . insulin aspart (novoLOG) injection 0-5 Units  0-5 Units Subcutaneous QHS Roxan Hockey, MD   5 Units at 10/02/19 2229  . insulin aspart (novoLOG) injection 16 Units  16 Units Subcutaneous TID WC Tat, David, MD      . insulin glargine (LANTUS) injection 50 Units  50 Units Subcutaneous QHS Tat, David, MD      . iohexol (OMNIPAQUE) 350 MG/ML injection 100 mL  100 mL Intravenous Once PRN Tat, Shanon Brow, MD      .  methylPREDNISolone sodium succinate (SOLU-MEDROL) 125 mg/2 mL injection 125 mg  125 mg Intravenous Q12H Tat, Shanon Brow, MD      . metoprolol tartrate (LOPRESSOR) tablet 25 mg  25 mg Oral TID Roxan Hockey, MD   25 mg at 10/03/19 0829  . pantoprazole (PROTONIX) injection 40 mg  40 mg Intravenous Q12H Adefeso, Oladapo, DO   40 mg at 10/03/19 0830  . tamsulosin (FLOMAX) capsule 0.4 mg  0.4 mg Oral QPC supper [place] Brick, Courage, MD   0.4 mg at 10/02/19 1708  . zinc sulfate capsule 220 mg  220 mg Oral Daily Adefeso, Oladapo, DO   220 mg at 10/03/19 1518     Discharge Medications: Please see discharge summary for a list of discharge medications.  Relevant Imaging Results:  Relevant Lab Results:   Additional Information SSN: [id number]  Salome Arnt, LCSW

## 2019-10-03 NOTE — TOC Initial Note (Signed)
Transition of Care [facility]) - Initial/Assessment Note    Patient Details  Name: Stephen Wilcox MRN: [id number] Date of Birth: [birth year]  [facility] CM/SW Contact:    Salome Arnt, [place] Phone Number: 10/03/2019, 2:51 PM  Clinical Narrative:  Pt admitted with pneumonia due to COVID-19 virus. LCSW unable to reach pt by phone, so assessment completed with pt's daughter, [name]. Pt lives at home and is primary caregiver for wife. Pt's wife is also admitted to hospital due to [place]. [name] reports pt is very independent at baseline. PT evaluated pt and recommend SNF. LCSW discussed placement process and [name] is agreeable to initiate bed search. She is aware that facilities accepting pts with COVID are in [place]. TOC will follow up with bed offers when available.                 Expected Discharge Plan: Skilled Nursing Facility Barriers to Discharge: Continued Medical Work up   Patient Goals and CMS Choice Patient states their goals for this hospitalization and ongoing recovery are:: short term SNF      Expected Discharge Plan and Services Expected Discharge Plan: [place] In-house Referral: Clinical Social Work   Post Acute Care Choice: [place] Living arrangements for the past 2 months: Apartment                                      Prior Living Arrangements/Services Living arrangements for the past 2 months: Apartment Lives with:: Spouse Patient language and need for interpreter reviewed:: Yes Do you feel safe going back to the place where you live?: No   Needs short term rehab  Need for Family Participation in Patient Care: Yes (Comment) Care giver support system in place?: No (comment) (Pt is caregiver for wife.)   Criminal Activity/Legal Involvement Pertinent to Current Situation/Hospitalization: No - Comment as needed  Activities of Daily Living Home Assistive Devices/Equipment: None ADL Screening  (condition at time of admission) Patient's cognitive ability adequate to safely complete daily activities?: Yes Is the patient deaf or have difficulty hearing?: No Does the patient have difficulty seeing, even when wearing glasses/contacts?: No Does the patient have difficulty concentrating, remembering, or making decisions?: No Patient able to express need for assistance with ADLs?: Yes Does the patient have difficulty dressing or bathing?: No Independently performs ADLs?: Yes (appropriate for developmental age) Does the patient have difficulty walking or climbing stairs?: No Weakness of Legs: Both Weakness of Arms/Hands: None  Permission Sought/Granted                  Emotional Assessment   Attitude/Demeanor/Rapport: Unable to Assess Affect (typically observed): Unable to Assess     Psych Involvement: No (comment)  Admission diagnosis:  Acute respiratory failure with hypoxia (HCC) [J96.01] Pneumonia due to COVID-19 virus [U07.1, J12.82] Acute respiratory disease due to COVID-19 virus [U07.1, J06.9] COVID-19 [U07.1] Patient Active Problem List   Diagnosis Date Noted  . Atrial fibrillation with RVR ([place]) 10/03/2019  . Acute respiratory failure with hypoxia ([place]) 09/29/2019  . Sepsis ([place]) 09/29/2019  . Hyperglycemia 09/29/2019  . Thrombocytosis ([place]) 09/29/2019  . Hyponatremia 09/29/2019  . Hypokalemia 09/29/2019  . Microcytic anemia 09/29/2019  . GI bleed 09/29/2019  . Symptomatic anemia 09/29/2019  . Lactic acidosis 09/29/2019  . Acute respiratory disease due to COVID-19 virus 09/29/2019  . Pneumonia due to COVID-19 virus 09/28/2019  PCP:  Jake Samples, PA-C Pharmacy:   [facility] [address] [place] [address] Phone: [phone] Fax: [phone]     Social Determinants of Health (SDOH) Interventions    Readmission Risk Interventions No flowsheet data found.

## 2019-10-03 NOTE — Evaluation (Signed)
Physical Therapy Evaluation Patient Details Name: TRESON LAURA MRN: [id number] DOB: December 18, 1955 Today's Date: 10/03/2019   History of Present Illness  Stephen Wilcox is a 64 y.o. male with no pertinent medical history who presents to the emergency department due to increased weakness in the setting of COVID-19 virus infection.  Patient initially presented to the ED on 09/24/2019 due to 4-day.history of low energy, lightheadedness and fatigue, with no cough or shortness of breath.  Wife was hospitalized with Covid at that time.  Patient appeared to have declined use of monoclonal antibody infusion due to mild to moderate Covid symptoms he had at that time per medical record. He now presents to the emergency department with worsening weakness, dizziness, cough (nonproductive) and decreased energy.  He denies loss of taste or smell, nausea, vomiting, abdominal pain, diarrhea or constipation.  Patient did not get any of the Covid vaccines    Clinical Impression  Patient fatigues easily and SpO2 desaturates with exertion, limited to a few steps at bedside due to generalized weakness and fall risk.  Patient requested to go back to bed after therapy due to fatigue.  Patient will benefit from continued physical therapy in hospital and recommended venue below to increase strength, balance, endurance for safe ADLs and gait.    Follow Up Recommendations SNF    Equipment Recommendations  None recommended by PT    Recommendations for Other Services       Precautions / Restrictions Precautions Precautions: Fall Restrictions Weight Bearing Restrictions: No      Mobility  Bed Mobility Overal bed mobility: Modified Independent                Transfers Overall transfer level: Needs assistance Equipment used: Rolling walker (2 wheeled) Transfers: Sit to/from [facility] Sit to Stand: Min assist Stand pivot transfers: Min assist       General transfer comment:  required use of RW due weakness  Ambulation/Gait Ambulation/Gait assistance: Min assist;Mod assist Gait Distance (Feet): 4 Feet Assistive device: Rolling walker (2 wheeled) Gait Pattern/deviations: Decreased step length - right;Decreased step length - left;Decreased stride length Gait velocity: decreased   General Gait Details: limited to 3-4 slow labored steps at bedside due to c/o fatigue and SpO2 desaturation with exertion  Stairs            Wheelchair Mobility    Modified Rankin (Stroke Patients Only)       Balance Overall balance assessment: Needs assistance Sitting-balance support: Feet supported;No upper extremity supported Sitting balance-Leahy Scale: Good Sitting balance - Comments: seated at EOB   Standing balance support: During functional activity;No upper extremity supported Standing balance-Leahy Scale: Poor Standing balance comment: fair using RW                             Pertinent Vitals/Pain Pain Assessment: Faces Faces Pain Scale: Hurts a little bit Pain Location: generalized body aches all over Pain Descriptors / Indicators: Discomfort;Sore Pain Intervention(s): Limited activity within patient's tolerance;Monitored during session    [place] expects to be discharged to:: Private residence Living Arrangements: Spouse/significant other Available Help at Discharge: Family;Available PRN/intermittently Type of Home: Apartment Home Access: Level entry     Home Layout: One level Home Equipment: Walker - 2 wheels;Wheelchair - Regulatory affairs officer - single point      Prior Function Level of Independence: Independent         Comments: Hydrographic surveyor, drives  Hand Dominance        Extremity/Trunk Assessment   Upper Extremity Assessment Upper Extremity Assessment: Generalized weakness    Lower Extremity Assessment Lower Extremity Assessment: Generalized weakness    Cervical / Trunk  Assessment Cervical / Trunk Assessment: Normal  Communication   Communication: No difficulties  Cognition Arousal/Alertness: Awake/alert Behavior During Therapy: WFL for tasks assessed/performed Overall Cognitive Status: Within Functional Limits for tasks assessed                                        General Comments      Exercises     Assessment/Plan    PT Assessment Patient needs continued PT services  PT Problem List Decreased strength;Decreased activity tolerance;Decreased balance;Decreased mobility       PT Treatment Interventions Balance training;Gait training;DME instruction;Stair training;Functional mobility training;Therapeutic activities;Therapeutic exercise;Patient/family education    PT Goals (Current goals can be found in the Care Plan section)  Acute Rehab PT Goals Patient Stated Goal: return home after rehab able to take of self and spouse PT Goal Formulation: With patient Time For Goal Achievement: 10/17/19 Potential to Achieve Goals: Good    Frequency Min 3X/week   Barriers to discharge        Co-evaluation               AM-PAC PT "6 Clicks" Mobility  Outcome Measure Help needed turning from your back to your side while in a flat bed without using bedrails?: None Help needed moving from lying on your back to sitting on the side of a flat bed without using bedrails?: A Little Help needed moving to and from a bed to a chair (including a wheelchair)?: A Little Help needed standing up from a chair using your arms (e.g., wheelchair or bedside chair)?: A Little Help needed to walk in hospital room?: A Lot Help needed climbing 3-5 steps with a railing? : A Lot 6 Click Score: 17    End of Session Equipment Utilized During Treatment: Oxygen Activity Tolerance: Patient tolerated treatment well;Patient limited by fatigue Patient left: in bed;with call bell/phone within reach Nurse Communication: Mobility status PT Visit Diagnosis:  Unsteadiness on feet (R26.81);Other abnormalities of gait and mobility (R26.89)    Time: 2458-0998 PT Time Calculation (min) (ACUTE ONLY): 25 min   Charges:   PT Evaluation $PT Eval Moderate Complexity: 1 Mod PT Treatments $Therapeutic Activity: 23-37 mins        2:42 PM, 10/03/19 Lonell Grandchild, MPT Physical Therapist with [facility] 336 [phone] office [phone] mobile phone

## 2019-10-03 NOTE — Plan of Care (Signed)

## 2019-10-03 NOTE — Progress Notes (Signed)
PROGRESS NOTE  Stephen Wilcox ZYS:[id number] DOB: [birth year] DOA: 09/28/2019 PCP: Jake Samples, PA-C  Brief History:  64 year old male without any documented significant chronic medical problems presenting to the ED on 09/24/2019 with lightheadedness malaise, and some generalized weakness.  Patient was diagnosed with positive Covid.  He had declined monoclonal antibody infusion.  On 09/28/2019, the patient presented back to the emergency department with worsening generalized weakness, dizziness, and cough and malaise.  He was noted to have oxygen saturation of 82% room air and placed on 2 L nasal cannula.  Was subsequently started on remdesivir and IV steroids.  At the time of admission, the patient was noted to have a hemoglobin of 7.3 which was a significant drop when compared to 09/24/2019 when his hemoglobin was 9.1.  Hemoccult was noted to be positive during admission.  GI was consulted to assist with management.  Dr. Laural Golden saw the patient.  He did not feel that the patient had any overt active GI bleeding.  He recommended PPI infusion for 72 hours and subsequently transitioned to oral Protonix.  He recommended endoscopic evaluation if the patient's hemoglobin continued to drop again requiring transfusion.  The patient was transfused 2 units PRBC.  His hemoglobin went up to 9.3 and has remained stable since then.  The patient's oxygen demand gradually increased to 6 L, but subsequently remained stable.  Blood cultures grew Staph epidermidis in 1 of 2 sets.  This was thought to be a contaminant.  Empiric antibiotics were discontinued.  On 09/29/2019, the patient was noted to have atrial fibrillation.  EKG confirmed atrial fibrillation.  The patient was started on oral diltiazem.  Echocardiogram on 09/30/2019 showed EF 55 to 60%, no WMA.  Assessment/Plan: Acute respiratory failure with hypoxia secondary to COVID-19 pneumonia -Currently stable on 6-8 L nasal cannula -Oxygen saturation  90-92% -Increase Solu-Medrol to 125 mg IV twice daily -Continue vitamin C and zinc -CRP 19.4>> 15.8>> 7.9>> 3.6>> 1.7>> 1.0 -Ferritin 59>> 70>> 95>> 90>> 67>> 53 -D-dimer 1.89>> 1.48>> 1.26>> 1.13>> 1.31>> 1.48  Symptomatic anemia/heme positive stool -Appreciate GI consult -Dr. Melony Overly followed the patient--> recommended IV Protonix then transition to p.o. Protonix -Will ultimately need endoscopy -More urgent endoscopy if the patient's hemoglobin drops again -Transfused 2 units PRBC -Hemoglobin stable since transfusion  New onset atrial fibrillation -Currently rate controlled -Transition to long-acting diltiazem -Continue metoprolol -09/30/2019 echo EF 55 to 60%, no WMA -Start aspirin 81 mg daily -CHA2DS2-VASc = 1  Diabetes mellitus type 2 -Elevated CBGs secondary to steroids -09/28/2019 hemoglobin A1c 6.9 -Increase Lantus to 50 units -Resistant NovoLog sliding scale -Increase premeal insulin to 20 units  Microcytic anemia/Iron deficiency -iron saturation 3% -ferritin low despite COVID-19 -iron supplement when recovered from COVID-19  Thrombocytosis -due to iron deficiency and acute medical illness  Class II obesity -BMI 32.14 -Lifestyle modification  BPH -Continue tamsulosin  Bacteremia -Staph epidermidis 1 out of 2 sets -Represents contaminant  Generalized weakness -PT evaluation--> skilled nursing facility with which the patient and daughter agree      Status is: Inpatient  Remains inpatient appropriate because:IV treatments appropriate due to intensity of illness or inability to take PO   Dispo: The patient is from: Home              Anticipated d/c is to: SNF              Anticipated d/c date is: 1 day  Patient currently is not medically stable to d/c.        Family Communication:   Daughter updated 9/13  Consultants:  GI--Rehman  Code Status:  FULL   DVT Prophylaxis:  SCDs   Procedures: As Listed in Progress Note  Above  Antibiotics: None       Subjective: Patient complains sob with minimal exertion.  He denies f/c, n/v/d, abd pain.  No hematochezia, no melena, no dysuria  Objective: Vitals:   10/02/19 1754 10/02/19 1942 10/03/19 0100 10/03/19 0506  BP:  107/67 106/75 109/72  Pulse:  91 74 73  Resp:  20  20  Temp:  98.3 F (36.8 C)  98.2 F (36.8 C)  TempSrc:  Oral  Oral  SpO2: (!) 87% 93%  91%  Weight:      Height:        Intake/Output Summary (Last 24 hours) at 10/03/2019 1142 Last data filed at 10/03/2019 0900 Gross per 24 hour  Intake 240 ml  Output 2400 ml  Net -2160 ml   Weight change:  Exam:   General:  Pt is alert, follows commands appropriately, not in acute distress  HEENT: No icterus, No thrush, No neck mass, [place]/AT  Cardiovascular: RRR, S1/S2, no rubs, no gallops  Respiratory: bibasilar rales. No wheeze  Abdomen: Soft/+BS, non tender, non distended, no guarding  Extremities: No edema, No lymphangitis, No petechiae, No rashes, no synovitis   Data Reviewed: I have personally reviewed following labs and imaging studies Basic Metabolic Panel: Recent Labs  Lab 09/29/19 0950 09/30/19 0616 10/01/19 0714 10/02/19 0742 10/03/19 0730  NA 133* 133* 135 133* 134*  K 4.8 3.7 4.4 4.4 4.3  CL 94* 95* 93* 93* 94*  CO2 27 29 30 30 29   GLUCOSE 236* 291* 375* 367* 404*  BUN 18 20 25* 31* 41*  CREATININE 0.73 0.70 0.83 0.91 0.93  CALCIUM 7.9* 8.0* 8.3* 8.2* 8.4*  MG 2.4 2.3 2.4 2.5* 2.6*  PHOS 3.0 2.4* 2.5 3.3 3.4   Liver Function Tests: Recent Labs  Lab 09/29/19 0950 09/30/19 0616 10/01/19 0714 10/02/19 0742 10/03/19 0730  AST 59* 28 26 30  34  ALT 35 30 31 34 43  ALKPHOS 48 46 49 47 50  BILITOT 0.9 0.6 0.5 0.6 0.8  PROT 5.7* 5.9* 5.8* 5.6* 5.7*  ALBUMIN 2.3* 2.4* 2.3* 2.3* 2.5*   No results for input(s): LIPASE, AMYLASE in the last 168 hours. No results for input(s): AMMONIA in the last 168 hours. Coagulation Profile: No results for input(s):  INR, PROTIME in the last 168 hours. CBC: Recent Labs  Lab 09/29/19 0950 09/30/19 0616 10/01/19 0714 10/02/19 0742 10/03/19 0730  WBC 4.8 10.5 14.1* 15.7* 20.5*  NEUTROABS 4.3 9.5* 13.3* 14.3* 18.9*  HGB 7.0* 9.3* 9.5* 9.5* 9.9*  HCT 24.6* 31.0* 31.8* 32.5* 33.0*  MCV 75.2* 76.7* 78.1* 79.3* 78.8*  PLT 453* 519* 591* 577* 514*   Cardiac Enzymes: No results for input(s): CKTOTAL, CKMB, CKMBINDEX, TROPONINI in the last 168 hours. BNP: Invalid input(s): POCBNP CBG: Recent Labs  Lab 10/02/19 1104 10/02/19 1600 10/02/19 2055 10/03/19 0729 10/03/19 1114  GLUCAP 446* 452* 381* 357* 463*   HbA1C: No results for input(s): HGBA1C in the last 72 hours. Urine analysis:    Component Value Date/Time   COLORURINE YELLOW 08/15/2018 1009   APPEARANCEUR CLEAR 08/15/2018 1009   LABSPEC 1.013 08/15/2018 1009   PHURINE 6.0 08/15/2018 1009   GLUCOSEU NEGATIVE 08/15/2018 1009   HGBUR MODERATE (A) 08/15/2018 1009  BILIRUBINUR NEGATIVE 08/15/2018 1009   [place] 08/15/2018 1009   [place] 08/15/2018 1009   NITRITE NEGATIVE 08/15/2018 1009   [place] 08/15/2018 1009   Sepsis Labs: @LABRCNTIP (procalcitonin:4,lacticidven:4) ) Recent Results (from the past 240 hour(s))  SARS Coronavirus 2 by RT PCR (hospital order, performed in [facility] hospital lab) Nasopharyngeal Nasopharyngeal Swab     Status: Abnormal   Collection Time: 09/24/19 11:24 AM   Specimen: Nasopharyngeal Swab  Result Value Ref Range Status   SARS Coronavirus 2 POSITIVE (A) NEGATIVE Final    Comment: RESULT CALLED TO, READ BACK BY AND VERIFIED WITH: SANDRA RN @ [id number] ON [id number] BY HENDERSON L (NOTE) SARS-CoV-2 target nucleic acids are DETECTED  SARS-CoV-2 RNA is generally detectable in upper respiratory specimens  during the acute phase of infection.  Positive results are indicative  of the presence of the identified virus, but do not rule out bacterial infection or co-infection with  other pathogens not detected by the test.  Clinical correlation with patient history and  other diagnostic information is necessary to determine patient infection status.  The expected result is negative.  Fact Sheet for Patients:   StrictlyIdeas.no   Fact Sheet for Healthcare Providers:   BankingDealers.co.za    This test is not yet approved or cleared by the [place] FDA and  has been authorized for detection and/or diagnosis of SARS-CoV-2 by FDA under an Emergency Use Authorization (EUA).  This EUA will remain in effect (meaning  this test can be used) for the duration of  the COVID-19 declaration under Section 564(b)(1) of the Act, 21 U.S.C. section 360-bbb-3(b)(1), unless the authorization is terminated or revoked sooner.  Performed at [facility], [address], [place], [place] [address]   Blood Culture (routine x 2)     Status: Abnormal   Collection Time: 09/28/19 11:19 PM   Specimen: BLOOD  Result Value Ref Range Status   Specimen Description   Final    BLOOD LEFT ANTECUBITAL Performed at [facility], [address], [place], [place] [address]    Special Requests   Final    BOTTLES DRAWN AEROBIC AND ANAEROBIC Blood Culture adequate volume Performed at [facility], [address], [place], [place] [address]    Culture  Setup Time   Final    ANAEROBIC BOTTLE ONLY GRAM POSITIVE COCCI Gram Stain Report Called to,Read Back By and Verified With: T EASTER,RN@2206  09/29/19 MKELLY CRITICAL RESULT CALLED TO, READ BACK BY AND VERIFIED WITH: K NICHOLS RN 09/30/19 0611 JDW    Culture (A)  Final    STAPHYLOCOCCUS EPIDERMIDIS THE SIGNIFICANCE OF ISOLATING THIS ORGANISM FROM A SINGLE SET OF BLOOD CULTURES WHEN MULTIPLE SETS ARE DRAWN IS UNCERTAIN. PLEASE NOTIFY THE MICROBIOLOGY DEPARTMENT WITHIN ONE WEEK IF SPECIATION AND SENSITIVITIES ARE REQUIRED. Performed at [facility], [place] [address] [place], [place] [address]     Report Status 10/02/2019 FINAL  Final  Blood Culture ID Panel (Reflexed)     Status: Abnormal   Collection Time: 09/28/19 11:19 PM  Result Value Ref Range Status   Enterococcus faecalis NOT DETECTED NOT DETECTED Final   Enterococcus Faecium NOT DETECTED NOT DETECTED Final   Listeria monocytogenes NOT DETECTED NOT DETECTED Final   Staphylococcus species DETECTED (A) NOT DETECTED Final    Comment: CRITICAL RESULT CALLED TO, READ BACK BY AND VERIFIED WITH: Merideth Abbey RN 09/30/19 3716 JDW    Staphylococcus aureus (BCID) NOT DETECTED NOT DETECTED Final   Staphylococcus epidermidis DETECTED (A) NOT  DETECTED Final    Comment: Methicillin (oxacillin) resistant coagulase negative staphylococcus. Possible blood culture contaminant (unless isolated from more than one blood culture draw or clinical case suggests pathogenicity). No antibiotic treatment is indicated for blood  culture contaminants. CRITICAL RESULT CALLED TO, READ BACK BY AND VERIFIED WITH: K NICHOLS RN 09/30/19 9470 JDW    Staphylococcus lugdunensis NOT DETECTED NOT DETECTED Final   Streptococcus species NOT DETECTED NOT DETECTED Final   Streptococcus agalactiae NOT DETECTED NOT DETECTED Final   Streptococcus pneumoniae NOT DETECTED NOT DETECTED Final   Streptococcus pyogenes NOT DETECTED NOT DETECTED Final   A.calcoaceticus-baumannii NOT DETECTED NOT DETECTED Final   Bacteroides fragilis NOT DETECTED NOT DETECTED Final   Enterobacterales NOT DETECTED NOT DETECTED Final   Enterobacter cloacae complex NOT DETECTED NOT DETECTED Final   Escherichia coli NOT DETECTED NOT DETECTED Final   Klebsiella aerogenes NOT DETECTED NOT DETECTED Final   Klebsiella oxytoca NOT DETECTED NOT DETECTED Final   Klebsiella pneumoniae NOT DETECTED NOT DETECTED Final   Proteus species NOT DETECTED NOT DETECTED Final   Salmonella species NOT DETECTED NOT DETECTED Final   Serratia marcescens NOT DETECTED NOT DETECTED Final   Haemophilus influenzae NOT  DETECTED NOT DETECTED Final   Neisseria meningitidis NOT DETECTED NOT DETECTED Final   Pseudomonas aeruginosa NOT DETECTED NOT DETECTED Final   Stenotrophomonas maltophilia NOT DETECTED NOT DETECTED Final   Candida albicans NOT DETECTED NOT DETECTED Final   Candida auris NOT DETECTED NOT DETECTED Final   Candida glabrata NOT DETECTED NOT DETECTED Final   Candida krusei NOT DETECTED NOT DETECTED Final   Candida parapsilosis NOT DETECTED NOT DETECTED Final   Candida tropicalis NOT DETECTED NOT DETECTED Final   Cryptococcus neoformans/gattii NOT DETECTED NOT DETECTED Final   Methicillin resistance mecA/C DETECTED (A) NOT DETECTED Final    Comment: CRITICAL RESULT CALLED TO, READ BACK BY AND VERIFIED WITH: Merideth Abbey RN 09/30/19 9628 JDW Performed at [facility], [address] [place], [place] [address]   Blood Culture (routine x 2)     Status: None (Preliminary result)   Collection Time: 09/28/19 11:34 PM   Specimen: BLOOD  Result Value Ref Range Status   Specimen Description BLOOD RIGHT ANTECUBITAL  Final   Special Requests   Final    BOTTLES DRAWN AEROBIC AND ANAEROBIC Blood Culture adequate volume   Culture   Final    NO GROWTH 3 DAYS Performed at [facility], [address], [place], [place] [address]    Report Status PENDING  Incomplete     Scheduled Meds: . sodium chloride   Intravenous Once  . albuterol  2 puff Inhalation Q6H WA  . vitamin C  500 mg Oral Daily  . diltiazem  30 mg Oral Q6H  . insulin aspart  0-20 Units Subcutaneous TID WC  . insulin aspart  0-5 Units Subcutaneous QHS  . insulin aspart  30 Units Subcutaneous Once  . insulin aspart  6 Units Subcutaneous TID WC  . insulin glargine  28 Units Subcutaneous QHS  . methylPREDNISolone (SOLU-MEDROL) injection  60 mg Intravenous Q12H  . metoprolol tartrate  25 mg Oral TID  . pantoprazole  40 mg Intravenous Q12H  . tamsulosin  0.4 mg Oral QPC supper  . zinc sulfate  220 mg Oral Daily   Continuous  Infusions:  Procedures/Studies: DG Chest 2 View  Result Date: 09/15/2019 CLINICAL DATA:  Acute chest pain. EXAM: CHEST - 2 VIEW COMPARISON:  None. FINDINGS: The heart size and  mediastinal contours are within normal limits. No pneumothorax or pleural effusion is noted. Right lung is clear. Minimal left basilar subsegmental atelectasis or scarring is noted. The visualized skeletal structures are unremarkable. IMPRESSION: Minimal left basilar subsegmental atelectasis or scarring. Electronically Signed   By: Marijo Conception M.D.   On: 09/15/2019 16:58   DG Ribs Unilateral Right  Result Date: 09/15/2019 CLINICAL DATA:  Acute right chest pain. EXAM: RIGHT RIBS - 2 VIEW COMPARISON:  None. FINDINGS: No fracture or other bone lesions are seen involving the ribs. IMPRESSION: Negative. Electronically Signed   By: Marijo Conception M.D.   On: 09/15/2019 16:57   DG Chest Port 1 View  Result Date: 09/28/2019 CLINICAL DATA:  COVID EXAM: PORTABLE CHEST 1 VIEW COMPARISON:  09/24/2019 FINDINGS: Development of patchy bilateral airspace opacities. No pleural effusion. Stable cardiomediastinal silhouette. No pneumothorax. IMPRESSION: Development of patchy bilateral airspace opacities, consistent with bilateral pneumonia. Electronically Signed   By: Donavan Foil M.D.   On: 09/28/2019 23:14   DG Chest Port 1 View  Result Date: 09/24/2019 CLINICAL DATA:  COVID pneumonia, weakness, fatigue EXAM: PORTABLE CHEST 1 VIEW COMPARISON:  09/15/2019 FINDINGS: The lungs are symmetrically expanded. Minimal left basilar atelectasis is again noted. No superimposed confluent pulmonary infiltrate. No pneumothorax or pleural effusion. Cardiac size within normal limits. The pulmonary vascularity is normal. No acute bone abnormality. IMPRESSION: Minimal left basilar atelectasis, unchanged. No superimposed confluent pulmonary infiltrate. Electronically Signed   By: Fidela Salisbury MD   On: 09/24/2019 20:15   ECHOCARDIOGRAM COMPLETE  Result  Date: 09/30/2019    ECHOCARDIOGRAM REPORT   Patient Name:   Stephen Wilcox Date of Exam: 09/30/2019 Medical Rec #:  [id number]         Height:       72.0 in Accession #:    [id number]        Weight:       237.0 lb Date of Birth:  1955/07/18        BSA:          2.290 m Patient Age:    38 years          BP:           95/60 mmHg Patient Gender: M                 HR:           126 bpm. Exam Location:  [facility] Procedure: 2D Echo Indications:    Endocarditis I38  History:        Patient has no prior history of Echocardiogram examinations.                 Risk Factors:Non-Smoker. Pneumonia due to COVID-19 virus,                 Sepsis, Lactic Acidosis.  Sonographer:    Leavy Cella RDCS (AE) Referring Phys: [id number] OLADAPO ADEFESO IMPRESSIONS  1. Left ventricular ejection fraction, by estimation, is 55 to 60%. The left ventricle has normal function. The left ventricle has no regional wall motion abnormalities. There is moderate left ventricular hypertrophy. Left ventricular diastolic parameters are indeterminate.  2. Right ventricular systolic function is normal. The right ventricular size is normal. Tricuspid regurgitation signal is inadequate for assessing PA pressure.  3. The mitral valve is grossly normal. No evidence of mitral valve regurgitation.  4. The aortic valve is tricuspid. Aortic valve regurgitation is not visualized.  5. The inferior vena cava  is normal in size with <50% respiratory variability, suggesting right atrial pressure of 8 mmHg.  6. Views are somewhat limited, but no definite valvular vegetations are visualized. FINDINGS  Left Ventricle: Left ventricular ejection fraction, by estimation, is 55 to 60%. The left ventricle has normal function. The left ventricle has no regional wall motion abnormalities. The left ventricular internal cavity size was normal in size. There is  moderate left ventricular hypertrophy. Left ventricular diastolic parameters are indeterminate. Right Ventricle: The  right ventricular size is normal. No increase in right ventricular wall thickness. Right ventricular systolic function is normal. Tricuspid regurgitation signal is inadequate for assessing PA pressure. Left Atrium: Left atrial size was normal in size. Right Atrium: Right atrial size was normal in size. Pericardium: There is no evidence of pericardial effusion. Mitral Valve: The mitral valve is grossly normal. No evidence of mitral valve regurgitation. Tricuspid Valve: The tricuspid valve is grossly normal. Tricuspid valve regurgitation is trivial. Aortic Valve: The aortic valve is tricuspid. Aortic valve regurgitation is not visualized. Pulmonic Valve: The pulmonic valve was grossly normal. Pulmonic valve regurgitation is trivial. Aorta: The aortic root is normal in size and structure. Venous: The inferior vena cava is normal in size with less than 50% respiratory variability, suggesting right atrial pressure of 8 mmHg. IAS/Shunts: No atrial level shunt detected by color flow Doppler.  LEFT VENTRICLE PLAX 2D LVIDd:         4.37 cm Diastology LVIDs:         2.20 cm LV e' medial:    7.18 cm/s LV PW:         1.72 cm LV E/e' medial:  9.2 LV IVS:        1.46 cm LV e' lateral:   12.90 cm/s                        LV E/e' lateral: 5.1  RIGHT VENTRICLE RV S prime:     16.00 cm/s TAPSE (M-mode): 1.5 cm LEFT ATRIUM             Index       RIGHT ATRIUM           Index LA diam:        3.70 cm 1.62 cm/m  RA Area:     15.40 cm LA Vol (A2C):   63.9 ml 27.90 ml/m RA Volume:   39.80 ml  17.38 ml/m LA Vol (A4C):   40.6 ml 17.73 ml/m LA Biplane Vol: 52.4 ml 22.88 ml/m   AORTA Ao Root diam: 3.50 cm MITRAL VALVE MV Area (PHT): 6.60 cm MV Decel Time: 115 msec MV E velocity: 66.00 cm/s MV A velocity: 39.80 cm/s MV E/A ratio:  1.66 Rozann Lesches MD Electronically signed by Rozann Lesches MD Signature Date/Time: 09/30/2019/4:58:16 PM    Final     Orson Eva, DO  [facility]  If 7PM-7AM, please contact  night-coverage [email or web address] Password [facility] 10/03/2019, 11:42 AM   LOS: 5 days

## 2019-10-03 NOTE — Plan of Care (Signed)
  Problem: Acute Rehab PT Goals(only PT should resolve) Goal: Pt Will Go Supine/Side To Sit Outcome: Progressing Flowsheets (Taken 10/03/2019 1443) Pt will go Supine/Side to Sit:  Independently  with modified independence Goal: Patient Will Transfer Sit To/From Stand Outcome: Progressing Flowsheets (Taken 10/03/2019 1443) Patient will transfer sit to/from stand: with min guard assist Goal: Pt Will Transfer Bed To Chair/Chair To Bed Outcome: Progressing Flowsheets (Taken 10/03/2019 1443) Pt will Transfer Bed to Chair/Chair to Bed: min guard assist Goal: Pt Will Ambulate Outcome: Progressing Flowsheets (Taken 10/03/2019 1443) Pt will Ambulate:  50 feet  with min guard assist  with rolling walker   2:44 PM, 10/03/19 Lonell Grandchild, MPT Physical Therapist with [facility] [phone] office [phone] mobile phone

## 2019-10-03 NOTE — Progress Notes (Signed)
Inpatient Diabetes Program Recommendations  AACE/ADA: New Consensus Statement on Inpatient Glycemic Control (2015)  Target Ranges:  Prepandial:   less than 140 mg/dL      Peak postprandial:   less than 180 mg/dL (1-2 hours)      Critically ill patients:  140 - 180 mg/dL   Lab Results  Component Value Date   GLUCAP 463 (H) 10/03/2019   HGBA1C 6.9 (H) 09/28/2019    Review of Glycemic Control Results for TYRIQ, MORAGNE (MRN [id number]) as of 10/03/2019 11:59  Ref. Range 10/02/2019 11:04 10/02/2019 16:00 10/02/2019 20:55 10/03/2019 07:29 10/03/2019 11:14  Glucose-Capillary Latest Ref Range: 70 - 99 mg/dL 446 (H) 452 (H) 381 (H) 357 (H) 463 (H)   Diabetes history: None- Hyperglycemia Outpatient Diabetes medications: None Current orders for Inpatient glycemic control:  Novolog resistant tid with meals and HS Novolog 6 units tid with meals Lantus 28 units q HS Solumedrol 60 mg IV q 12 hours  Inpatient Diabetes Program Recommendations:    Please consider increasing Novolog meal coverage to 10 units tid with meals.  As steroids are reduced, CBG's should also reduce as well and therefore insulin will need to be reduced.   A1C=6.9%? If this is new diagnosis of DM? However probably needs to be re-examined once patient recovers from [place].    Thanks,  Adah Perl, RN, BC-ADM Inpatient Diabetes Coordinator Pager [phone]

## 2019-10-04 DIAGNOSIS — R160 Hepatomegaly, not elsewhere classified: Secondary | ICD-10-CM

## 2019-10-04 LAB — CBC WITH DIFFERENTIAL/PLATELET
Abs Immature Granulocytes: 0.48 10*3/uL — ABNORMAL HIGH (ref 0.00–0.07)
Basophils Absolute: 0 10*3/uL (ref 0.0–0.1)
Basophils Relative: 0 %
Eosinophils Absolute: 0 10*3/uL (ref 0.0–0.5)
Eosinophils Relative: 0 %
HCT: 31.8 % — ABNORMAL LOW (ref 39.0–52.0)
Hemoglobin: 9.5 g/dL — ABNORMAL LOW (ref 13.0–17.0)
Immature Granulocytes: 3 %
Lymphocytes Relative: 1 %
Lymphs Abs: 0.2 10*3/uL — ABNORMAL LOW (ref 0.7–4.0)
MCH: 23.5 pg — ABNORMAL LOW (ref 26.0–34.0)
MCHC: 29.9 g/dL — ABNORMAL LOW (ref 30.0–36.0)
MCV: 78.5 fL — ABNORMAL LOW (ref 80.0–100.0)
Monocytes Absolute: 0.6 10*3/uL (ref 0.1–1.0)
Monocytes Relative: 4 %
Neutro Abs: 16.8 10*3/uL — ABNORMAL HIGH (ref 1.7–7.7)
Neutrophils Relative %: 92 %
Platelets: 463 10*3/uL — ABNORMAL HIGH (ref 150–400)
RBC: 4.05 MIL/uL — ABNORMAL LOW (ref 4.22–5.81)
RDW: 18.7 % — ABNORMAL HIGH (ref 11.5–15.5)
WBC: 18.1 10*3/uL — ABNORMAL HIGH (ref 4.0–10.5)
nRBC: 0.7 % — ABNORMAL HIGH (ref 0.0–0.2)

## 2019-10-04 LAB — FERRITIN: Ferritin: 50 ng/mL (ref 24–336)

## 2019-10-04 LAB — COMPREHENSIVE METABOLIC PANEL
ALT: 42 U/L (ref 0–44)
AST: 36 U/L (ref 15–41)
Albumin: 2.4 g/dL — ABNORMAL LOW (ref 3.5–5.0)
Alkaline Phosphatase: 46 U/L (ref 38–126)
Anion gap: 8 (ref 5–15)
BUN: 36 mg/dL — ABNORMAL HIGH (ref 8–23)
CO2: 31 mmol/L (ref 22–32)
Calcium: 8.1 mg/dL — ABNORMAL LOW (ref 8.9–10.3)
Chloride: 97 mmol/L — ABNORMAL LOW (ref 98–111)
Creatinine, Ser: 0.86 mg/dL (ref 0.61–1.24)
GFR calc Af Amer: >60 mL/min (ref >60)
GFR calc non Af Amer: >60 mL/min (ref >60)
Glucose, Bld: 234 mg/dL — ABNORMAL HIGH (ref 70–99)
Potassium: 4.3 mmol/L (ref 3.5–5.1)
Sodium: 136 mmol/L (ref 135–145)
Total Bilirubin: 0.6 mg/dL (ref 0.3–1.2)
Total Protein: 5.4 g/dL — ABNORMAL LOW (ref 6.5–8.1)

## 2019-10-04 LAB — GLUCOSE, CAPILLARY
Glucose-Capillary: 211 mg/dL — ABNORMAL HIGH (ref 70–99)
Glucose-Capillary: 298 mg/dL — ABNORMAL HIGH (ref 70–99)
Glucose-Capillary: 241 mg/dL — ABNORMAL HIGH (ref 70–99)
Glucose-Capillary: 189 mg/dL — ABNORMAL HIGH (ref 70–99)

## 2019-10-04 LAB — D-DIMER, QUANTITATIVE: D-Dimer, Quant: 1.71 ug/mL-FEU — ABNORMAL HIGH (ref 0.00–0.50)

## 2019-10-04 LAB — C-REACTIVE PROTEIN: CRP: 0.8 mg/dL (ref <1.0)

## 2019-10-04 MED ORDER — INSULIN ASPART 100 UNIT/ML ~~LOC~~ SOLN
20.0000 [IU] | Freq: Three times a day (TID) | SUBCUTANEOUS | Status: DC
  Administered 2019-10-04 – 2019-10-06 (×7): 20 [IU] via SUBCUTANEOUS

## 2019-10-04 NOTE — Progress Notes (Addendum)
PROGRESS NOTE  Stephen Wilcox WJX:[id number] DOB: [birth year] DOA: 09/28/2019 PCP: Stephen Samples, PA-C  Brief History:  64 year old male without any documented significant chronic medical problems presenting to the ED on 09/24/2019 with lightheadedness malaise, and some generalized weakness.  Patient was diagnosed with positive Covid.  He had declined monoclonal antibody infusion.  On 09/28/2019, the patient presented back to the emergency department with worsening generalized weakness, dizziness, and cough and malaise.  He was noted to have oxygen saturation of 82% room air and placed on 2 L nasal cannula.  Was subsequently started on remdesivir and IV steroids.  At the time of admission, the patient was noted to have a hemoglobin of 7.3 which was a significant drop when compared to 09/24/2019 when his hemoglobin was 9.1.  Hemoccult was noted to be positive during admission.  GI was consulted to assist with management.  Dr. Laural Golden saw the patient.  He did not feel that the patient had any overt active GI bleeding.  He recommended PPI infusion for 72 hours and subsequently transitioned to oral Protonix.  He recommended endoscopic evaluation if the patient's hemoglobin continued to drop again requiring transfusion.  The patient was transfused 2 units PRBC.  His hemoglobin went up to 9.3 and has remained stable since then.  The patient's oxygen demand gradually increased to 6 L, but subsequently remained stable.  Blood cultures grew Staph epidermidis in 1 of 2 sets.  This was thought to be a contaminant.  Empiric antibiotics were discontinued.  On 09/29/2019, the patient was noted to have atrial fibrillation.  EKG confirmed atrial fibrillation.  The patient was started on oral diltiazem.  Echocardiogram on 09/30/2019 showed EF 55 to 60%, no WMA.  Assessment/Plan: Acute respiratory failure with hypoxia secondary to COVID-19 pneumonia -sepsis ruled out -Currently stable on 6-8 L nasal  cannula -Oxygen saturation 90-92% -Increase Solu-Medrol to 125 mg IV twice daily -Continue vitamin C and zinc -CRP 19.4>> 15.8>> 7.9>> 3.6>> 1.7>> 1.0>>0.8 -Ferritin 59>> 70>> 95>> 90>> 67>> 53>>50 -D-dimer 1.89>> 1.48>> 1.26>> 1.13>> 1.31>> 1.48>>1.71 -finished 5 days remdesivir 10/03/19 -10/03/19 CTA chest--no PE; 4.8 cm lesion in the right lobe liver, incompletely characterized. Additional multiple soft tissue attenuation lesions along the lesser curvature/gastrohepatic ligament, largest measuring up to 4.6 cm in size  Gastric and Hepatic Masses -incidental finding on CTA chest -10/03/19 MR Liver--6.5 x 4.8 cm infiltrating gastric mass involving the fundus and numerous liver masses -GI consult--> -may need liver bx -patient and daughter updated  Symptomatic anemia/heme positive stool -Appreciate GI consult -Dr. Melony Overly followed the patient--> recommended IV Protonix then transition to p.o. Protonix -Will ultimately need endoscopy -More urgent endoscopy if the patient's hemoglobin drops again -Transfused 2 units PRBC -Hemoglobin stable since transfusion -in retrospect, gastric mass may have been source of bleed  New onset atrial fibrillation -Currently rate controlled -Transition to long-acting diltiazem-->increase to 180 mg -Continue metoprolol -09/30/2019 echo EF 55 to 60%, no WMA -Start aspirin 81 mg daily -CHA2DS2-VASc = 1 -TSH--0.733 -hold ASA until evaluated by GI for possible endoscopy  Diabetes mellitus type 2 -Elevated CBGs secondary to steroids -09/28/2019 hemoglobin A1c 6.9 -Increase Lantus to 50 units -Resistant NovoLog sliding scale -Increase premeal insulin to 20 units  Microcytic anemia/Iron deficiency -iron saturation 3% -ferritin low despite COVID-19 -iron supplement when recovered from COVID-19  Thrombocytosis -due to iron deficiency and acute medical illness  Class II obesity -BMI 32.14 -Lifestyle modification  BPH -Continue  tamsulosin  Bacteremia -Staph epidermidis 1 out of 2 sets -Represents contaminant  Generalized weakness -PT evaluation--> skilled nursing facility with which the patient and daughter agree      Status is: Inpatient  Remains inpatient appropriate because:IV treatments appropriate due to intensity of illness or inability to take PO   Dispo: The patient is from: Home  Anticipated d/c is to: SNF  Anticipated d/c date is: 2 day  Patient currently is not medically stable to d/c.        Family Communication:   Daughter updated 9/14  Consultants:  GI--Rehman  Code Status:  FULL   DVT Prophylaxis:  SCDs   Procedures: As Listed in Progress Note Above  Antibiotics: None     Subjective: Patient states he is breathing better, but becomes sob with minimal activity.  Denies f/c, cp, n/v/d, abd pain.  Objective: Vitals:   10/03/19 2139 10/04/19 0537 10/04/19 0733 10/04/19 1151  BP: 126/78 125/89  109/84  Pulse: 74 85  99  Resp: 20 20  18   Temp: 98.3 F (36.8 C) 97.8 F (36.6 C)  97.8 F (36.6 C)  TempSrc: Oral Oral  Oral  SpO2: 91% 92% 91% 97%  Weight:      Height:        Intake/Output Summary (Last 24 hours) at 10/04/2019 1249 Last data filed at 10/04/2019 0752 Gross per 24 hour  Intake 480 ml  Output 2050 ml  Net -1570 ml   Weight change:  Exam:   General:  Pt is alert, follows commands appropriately, not in acute distress  HEENT: No icterus, No thrush, No neck mass, [place]/AT  Cardiovascular: RRR, S1/S2, no rubs, no gallops  Respiratory: bibasilar rales. No wheeze  Abdomen: Soft/+BS, non tender, non distended, no guarding  Extremities: No edema, No lymphangitis, No petechiae, No rashes, no synovitis   Data Reviewed: I have personally reviewed following labs and imaging studies Basic Metabolic Panel: Recent Labs  Lab 09/29/19 0950 09/29/19 0950 09/30/19 0616 10/01/19 0714  10/02/19 0742 10/03/19 0730 10/04/19 0650  NA 133*   < > 133* 135 133* 134* 136  K 4.8   < > 3.7 4.4 4.4 4.3 4.3  CL 94*   < > 95* 93* 93* 94* 97*  CO2 27   < > 29 30 30 29 31   GLUCOSE 236*   < > 291* 375* 367* 404* 234*  BUN 18   < > 20 25* 31* 41* 36*  CREATININE 0.73   < > 0.70 0.83 0.91 0.93 0.86  CALCIUM 7.9*   < > 8.0* 8.3* 8.2* 8.4* 8.1*  MG 2.4  --  2.3 2.4 2.5* 2.6*  --   PHOS 3.0  --  2.4* 2.5 3.3 3.4  --    < > = values in this interval not displayed.   Liver Function Tests: Recent Labs  Lab 09/30/19 0616 10/01/19 0714 10/02/19 0742 10/03/19 0730 10/04/19 0650  AST 28 26 30  34 36  ALT 30 31 34 43 42  ALKPHOS 46 49 47 50 46  BILITOT 0.6 0.5 0.6 0.8 0.6  PROT 5.9* 5.8* 5.6* 5.7* 5.4*  ALBUMIN 2.4* 2.3* 2.3* 2.5* 2.4*   No results for input(s): LIPASE, AMYLASE in the last 168 hours. No results for input(s): AMMONIA in the last 168 hours. Coagulation Profile: No results for input(s): INR, PROTIME in the last 168 hours. CBC: Recent Labs  Lab 09/30/19 0616 10/01/19 0714 10/02/19 0742 10/03/19 0730 10/04/19 0650  WBC 10.5 14.1* 15.7* 20.5* 18.1*  NEUTROABS 9.5* 13.3* 14.3* 18.9* 16.8*  HGB 9.3* 9.5* 9.5* 9.9* 9.5*  HCT 31.0* 31.8* 32.5* 33.0* 31.8*  MCV 76.7* 78.1* 79.3* 78.8* 78.5*  PLT 519* 591* 577* 514* 463*   Cardiac Enzymes: No results for input(s): CKTOTAL, CKMB, CKMBINDEX, TROPONINI in the last 168 hours. BNP: Invalid input(s): POCBNP CBG: Recent Labs  Lab 10/03/19 1114 10/03/19 1632 10/03/19 2133 10/04/19 0741 10/04/19 1107  GLUCAP 463* 390* 309* 211* 298*   HbA1C: No results for input(s): HGBA1C in the last 72 hours. Urine analysis:    Component Value Date/Time   COLORURINE YELLOW 08/15/2018 1009   APPEARANCEUR CLEAR 08/15/2018 1009   LABSPEC 1.013 08/15/2018 1009   PHURINE 6.0 08/15/2018 1009   GLUCOSEU NEGATIVE 08/15/2018 1009   HGBUR MODERATE (A) 08/15/2018 1009   BILIRUBINUR NEGATIVE 08/15/2018 1009   [place]  08/15/2018 1009   PROTEINUR NEGATIVE 08/15/2018 1009   NITRITE NEGATIVE 08/15/2018 1009   LEUKOCYTESUR NEGATIVE 08/15/2018 1009   Sepsis Labs: @LABRCNTIP (procalcitonin:4,lacticidven:4) ) Recent Results (from the past 240 hour(s))  Blood Culture (routine x 2)     Status: Abnormal   Collection Time: 09/28/19 11:19 PM   Specimen: BLOOD  Result Value Ref Range Status   Specimen Description   Final    BLOOD LEFT ANTECUBITAL Performed at [facility], [address] [place], [place] [address]    Special Requests   Final    BOTTLES DRAWN AEROBIC AND ANAEROBIC Blood Culture adequate volume Performed at [facility], [address] [place], [place] [address]    Culture  Setup Time   Final    ANAEROBIC BOTTLE ONLY GRAM POSITIVE COCCI Gram Stain Report Called to,Read Back By and Verified With: T EASTER,RN@[id number]  09/29/19 MKELLY CRITICAL RESULT CALLED TO, READ BACK BY AND VERIFIED WITH: K NICHOLS RN 09/30/19 0611 JDW    Culture (A)  Final    STAPHYLOCOCCUS EPIDERMIDIS THE SIGNIFICANCE OF ISOLATING THIS ORGANISM FROM A SINGLE SET OF BLOOD CULTURES WHEN MULTIPLE SETS ARE DRAWN IS UNCERTAIN. PLEASE NOTIFY THE MICROBIOLOGY DEPARTMENT WITHIN ONE WEEK IF SPECIATION AND SENSITIVITIES ARE REQUIRED. Performed at [facility], [place] [address], [place], [place] [address]    Report Status 10/02/2019 FINAL  Final  Blood Culture ID Panel (Reflexed)     Status: Abnormal   Collection Time: 09/28/19 11:19 PM  Result Value Ref Range Status   Enterococcus faecalis NOT DETECTED NOT DETECTED Final   Enterococcus Faecium NOT DETECTED NOT DETECTED Final   Listeria monocytogenes NOT DETECTED NOT DETECTED Final   Staphylococcus species DETECTED (A) NOT DETECTED Final    Comment: CRITICAL RESULT CALLED TO, READ BACK BY AND VERIFIED WITH: Merideth Abbey RN 09/30/19 1093 JDW    Staphylococcus aureus (BCID) NOT DETECTED NOT DETECTED Final   Staphylococcus epidermidis DETECTED (A) NOT DETECTED Final    Comment:  Methicillin (oxacillin) resistant coagulase negative staphylococcus. Possible blood culture contaminant (unless isolated from more than one blood culture draw or clinical case suggests pathogenicity). No antibiotic treatment is indicated for blood  culture contaminants. CRITICAL RESULT CALLED TO, READ BACK BY AND VERIFIED WITH: K NICHOLS RN 09/30/19 2355 JDW    Staphylococcus lugdunensis NOT DETECTED NOT DETECTED Final   Streptococcus species NOT DETECTED NOT DETECTED Final   Streptococcus agalactiae NOT DETECTED NOT DETECTED Final   Streptococcus pneumoniae NOT DETECTED NOT DETECTED Final   Streptococcus pyogenes NOT DETECTED NOT DETECTED Final   A.calcoaceticus-baumannii NOT DETECTED NOT DETECTED Final   Bacteroides fragilis NOT DETECTED NOT DETECTED Final  Enterobacterales NOT DETECTED NOT DETECTED Final   Enterobacter cloacae complex NOT DETECTED NOT DETECTED Final   Escherichia coli NOT DETECTED NOT DETECTED Final   Klebsiella aerogenes NOT DETECTED NOT DETECTED Final   Klebsiella oxytoca NOT DETECTED NOT DETECTED Final   Klebsiella pneumoniae NOT DETECTED NOT DETECTED Final   Proteus species NOT DETECTED NOT DETECTED Final   Salmonella species NOT DETECTED NOT DETECTED Final   Serratia marcescens NOT DETECTED NOT DETECTED Final   Haemophilus influenzae NOT DETECTED NOT DETECTED Final   Neisseria meningitidis NOT DETECTED NOT DETECTED Final   Pseudomonas aeruginosa NOT DETECTED NOT DETECTED Final   Stenotrophomonas maltophilia NOT DETECTED NOT DETECTED Final   Candida albicans NOT DETECTED NOT DETECTED Final   Candida auris NOT DETECTED NOT DETECTED Final   Candida glabrata NOT DETECTED NOT DETECTED Final   Candida krusei NOT DETECTED NOT DETECTED Final   Candida parapsilosis NOT DETECTED NOT DETECTED Final   Candida tropicalis NOT DETECTED NOT DETECTED Final   Cryptococcus neoformans/gattii NOT DETECTED NOT DETECTED Final   Methicillin resistance mecA/C DETECTED (A) NOT DETECTED  Final    Comment: CRITICAL RESULT CALLED TO, READ BACK BY AND VERIFIED WITH: Merideth Abbey RN 09/30/19 4268 JDW Performed at [facility], [address], [place], [place] [address]   Blood Culture (routine x 2)     Status: None   Collection Time: 09/28/19 11:34 PM   Specimen: BLOOD  Result Value Ref Range Status   Specimen Description BLOOD RIGHT ANTECUBITAL  Final   Special Requests   Final    BOTTLES DRAWN AEROBIC AND ANAEROBIC Blood Culture adequate volume   Culture   Final    NO GROWTH 5 DAYS Performed at [facility], [address], [place], [place] [address]    Report Status 10/03/2019 FINAL  Final     Scheduled Meds: . sodium chloride   Intravenous Once  . albuterol  2 puff Inhalation TID  . vitamin C  500 mg Oral Daily  . diltiazem  120 mg Oral Daily  . insulin aspart  0-20 Units Subcutaneous TID WC  . insulin aspart  0-5 Units Subcutaneous QHS  . insulin aspart  16 Units Subcutaneous TID WC  . insulin glargine  50 Units Subcutaneous QHS  . methylPREDNISolone (SOLU-MEDROL) injection  125 mg Intravenous Q12H  . metoprolol tartrate  25 mg Oral TID  . pantoprazole  40 mg Intravenous Q12H  . tamsulosin  0.4 mg Oral QPC supper  . zinc sulfate  220 mg Oral Daily   Continuous Infusions:  Procedures/Studies: DG Chest 2 View  Result Date: 09/15/2019 CLINICAL DATA:  Acute chest pain. EXAM: CHEST - 2 VIEW COMPARISON:  None. FINDINGS: The heart size and mediastinal contours are within normal limits. No pneumothorax or pleural effusion is noted. Right lung is clear. Minimal left basilar subsegmental atelectasis or scarring is noted. The visualized skeletal structures are unremarkable. IMPRESSION: Minimal left basilar subsegmental atelectasis or scarring. Electronically Signed   By: Marijo Conception M.D.   On: 09/15/2019 16:58   DG Ribs Unilateral Right  Result Date: 09/15/2019 CLINICAL DATA:  Acute right chest pain. EXAM: RIGHT RIBS - 2 VIEW COMPARISON:  None. FINDINGS: No  fracture or other bone lesions are seen involving the ribs. IMPRESSION: Negative. Electronically Signed   By: Marijo Conception M.D.   On: 09/15/2019 16:57   CT ANGIO CHEST PE W OR WO CONTRAST  Result Date: 10/03/2019 CLINICAL DATA:  COVID-19 positive, weakness, dizziness and  shortness of breath EXAM: CT ANGIOGRAPHY CHEST WITH CONTRAST TECHNIQUE: Multidetector CT imaging of the chest was performed using the standard protocol during bolus administration of intravenous contrast. Multiplanar CT image reconstructions and MIPs were obtained to evaluate the vascular anatomy. CONTRAST:  163mL OMNIPAQUE IOHEXOL 350 MG/ML SOLN COMPARISON:  Radiograph 09/28/2019, CT abdomen pelvis 08/15/2018 FINDINGS: Cardiovascular: Satisfactory opacification the pulmonary arteries to the segmental level. No pulmonary artery filling defects are identified. Central pulmonary arteries are normal caliber. Suboptimal opacification of the thoracic aorta. No discernible acute abnormality of the thoracic aorta proximal great vessels. Aberrant right subclavian artery. Borderline cardiac enlargement. Slight reflux of contrast into the IVC. Mediastinum/Nodes: No mediastinal fluid or gas. Normal thyroid gland and thoracic inlet. No acute abnormality of the trachea or esophagus. No worrisome mediastinal, hilar or axillary adenopathy. Lungs/Pleura: Mixed areas of heterogeneous consolidative and ground-glass opacity throughout both lungs with a basilar and peripheral predominance. No pneumothorax or visible effusion. Diffuse airways thickening. Upper Abdomen: Indeterminate intermediate attenuation (38 HU) lesion measuring up to 8.5 cm in maximum transaxial dimension within the right lobe liver (4/80). Cholelithiasis with a partially calcified gallstone towards the gallbladder neck. Multiple soft tissue attenuation lesions present along the lesser curvature/gastrohepatic ligament. Largest measuring up to 4.6 cm in size (4/91). Partially exophytic 0.7 cm  lesion arising from the upper pole left kidney, corresponding with a previously seen fluid attenuation cyst on prior comparison CT, could reflect intracystic hemorrhage/proteinaceous debris. Musculoskeletal: Multilevel degenerative changes are present in the imaged portions of the spine. No acute osseous abnormality or suspicious osseous lesion. Probable intramuscular lipoma of the right infraspinatus measuring up to 6.9 by 3.5 cm (4/20). No other acute or worrisome chest wall lesions. Review of the MIP images confirms the above findings. IMPRESSION: 1. No evidence of acute pulmonary artery filling defects. 2. Mixed areas of heterogeneous consolidative and ground-glass opacity throughout both lungs with a basilar and peripheral predominance. Findings are compatible with multifocal pneumonia compatible with a COVID-19 etiology. 3. Indeterminate 4.8 cm lesion in the right lobe liver, incompletely characterized. Additional multiple soft tissue attenuation lesions along the lesser curvature/gastrohepatic ligament, largest measuring up to 4.6 cm in size. These findings are new from comparison abdominal CT 08/15/2018 and could raise concern for potential malignancy. Could consider further evaluation with abdominal MR with contrast. 4. Additional 7 mm hyperdense lesion arising from the upper pole left kidney, could reflect proteinaceous cysts, Ob also be better evaluated on dedicated abdominal imaging. 5. Cholelithiasis. 6. Aberrant right subclavian artery. These results will be called to the ordering clinician or representative by the Radiologist Assistant, and communication documented in the PACS or [employer]. Electronically Signed   By: Lovena Le M.D.   On: 10/03/2019 15:38   MR LIVER W WO CONTRAST  Result Date: 10/04/2019 CLINICAL DATA:  Evaluate liver lesions and upper abdominal adenopathy seen on recent chest CT. EXAM: MRI ABDOMEN WITHOUT AND WITH CONTRAST TECHNIQUE: Multiplanar multisequence MR imaging  of the abdomen was performed both before and after the administration of intravenous contrast. CONTRAST:  50mL GADAVIST GADOBUTROL 1 MMOL/ML IV SOLN COMPARISON:  CT abdomen/pelvis 08/15/2018 FINDINGS: Lower chest: The lungs demonstrate changes of COVID pneumonia as demonstrated on today's chest CT. Hepatobiliary: Numerous diffusion positive hepatic metastatic lesions are noted. Segment 4A lesion measures 3.7 cm on image 7/8. 5 cm lesion and segment 7 on image 12/8. 5 cm lesion in segment 6 on image 18/8. Several other smaller lesions are noted. No intrahepatic biliary dilatation. There is diffuse fatty  infiltration of the liver noted. A 2 cm gallstone is noted in the gallbladder. No common bile duct dilatation. Pancreas:  No mass, inflammation or ductal dilatation. Spleen:  Normal size. No focal lesions. Adrenals/Urinary Tract: The adrenal glands and kidneys are unremarkable. Stomach/Bowel: There is a large infiltrating gastric mass involving the fundal region 6.5 x 4.8 cm and is diffusion positive. Moderate contrast enhancement is noted. Adjacent gastrohepatic ligament lymphadenopathy with the largest node measuring 4.6 cm. Vascular/Lymphatic: The aorta is normal in caliber. No dissection. The branch vessels are patent. No retroperitoneal lymphadenopathy. Other:  No ascites or abdominal wall hernia. Musculoskeletal: No worrisome bone lesions. IMPRESSION: 1. 6.5 x 4.8 cm infiltrating gastric mass involving the fundal region. Associated gastrohepatic ligament lymphadenopathy. 2. Numerous hepatic metastatic lesions. 3. Cholelithiasis. Electronically Signed   By: Marijo Sanes M.D.   On: 10/04/2019 06:26   DG Chest Port 1 View  Result Date: 09/28/2019 CLINICAL DATA:  COVID EXAM: PORTABLE CHEST 1 VIEW COMPARISON:  09/24/2019 FINDINGS: Development of patchy bilateral airspace opacities. No pleural effusion. Stable cardiomediastinal silhouette. No pneumothorax. IMPRESSION: Development of patchy bilateral airspace  opacities, consistent with bilateral pneumonia. Electronically Signed   By: Donavan Foil M.D.   On: 09/28/2019 23:14   DG Chest Port 1 View  Result Date: 09/24/2019 CLINICAL DATA:  COVID pneumonia, weakness, fatigue EXAM: PORTABLE CHEST 1 VIEW COMPARISON:  09/15/2019 FINDINGS: The lungs are symmetrically expanded. Minimal left basilar atelectasis is again noted. No superimposed confluent pulmonary infiltrate. No pneumothorax or pleural effusion. Cardiac size within normal limits. The pulmonary vascularity is normal. No acute bone abnormality. IMPRESSION: Minimal left basilar atelectasis, unchanged. No superimposed confluent pulmonary infiltrate. Electronically Signed   By: Fidela Salisbury MD   On: 09/24/2019 20:15   ECHOCARDIOGRAM COMPLETE  Result Date: 09/30/2019    ECHOCARDIOGRAM REPORT   Patient Name:   FERN ASMAR Date of Exam: 09/30/2019 Medical Rec #:  [id number]         Height:       72.0 in Accession #:    [id number]        Weight:       237.0 lb Date of Birth:  [birth year]        BSA:          2.290 m Patient Age:    34 years          BP:           95/60 mmHg Patient Gender: M                 HR:           126 bpm. Exam Location:  [facility] Procedure: 2D Echo Indications:    Endocarditis I38  History:        Patient has no prior history of Echocardiogram examinations.                 Risk Factors:Non-Smoker. Pneumonia due to COVID-19 virus,                 Sepsis, Lactic Acidosis.  Sonographer:    Leavy Cella RDCS (AE) Referring Phys: [id number] OLADAPO ADEFESO IMPRESSIONS  1. Left ventricular ejection fraction, by estimation, is 55 to 60%. The left ventricle has normal function. The left ventricle has no regional wall motion abnormalities. There is moderate left ventricular hypertrophy. Left ventricular diastolic parameters are indeterminate.  2. Right ventricular systolic function is normal. The right ventricular size is normal. Tricuspid regurgitation signal is inadequate  for assessing PA  pressure.  3. The mitral valve is grossly normal. No evidence of mitral valve regurgitation.  4. The aortic valve is tricuspid. Aortic valve regurgitation is not visualized.  5. The inferior vena cava is normal in size with <50% respiratory variability, suggesting right atrial pressure of 8 mmHg.  6. Views are somewhat limited, but no definite valvular vegetations are visualized. FINDINGS  Left Ventricle: Left ventricular ejection fraction, by estimation, is 55 to 60%. The left ventricle has normal function. The left ventricle has no regional wall motion abnormalities. The left ventricular internal cavity size was normal in size. There is  moderate left ventricular hypertrophy. Left ventricular diastolic parameters are indeterminate. Right Ventricle: The right ventricular size is normal. No increase in right ventricular wall thickness. Right ventricular systolic function is normal. Tricuspid regurgitation signal is inadequate for assessing PA pressure. Left Atrium: Left atrial size was normal in size. Right Atrium: Right atrial size was normal in size. Pericardium: There is no evidence of pericardial effusion. Mitral Valve: The mitral valve is grossly normal. No evidence of mitral valve regurgitation. Tricuspid Valve: The tricuspid valve is grossly normal. Tricuspid valve regurgitation is trivial. Aortic Valve: The aortic valve is tricuspid. Aortic valve regurgitation is not visualized. Pulmonic Valve: The pulmonic valve was grossly normal. Pulmonic valve regurgitation is trivial. Aorta: The aortic root is normal in size and structure. Venous: The inferior vena cava is normal in size with less than 50% respiratory variability, suggesting right atrial pressure of 8 mmHg. IAS/Shunts: No atrial level shunt detected by color flow Doppler.  LEFT VENTRICLE PLAX 2D LVIDd:         4.37 cm Diastology LVIDs:         2.20 cm LV e' medial:    7.18 cm/s LV PW:         1.72 cm LV E/e' medial:  9.2 LV IVS:        1.46 cm LV e'  lateral:   12.90 cm/s                        LV E/e' lateral: 5.1  RIGHT VENTRICLE RV S prime:     16.00 cm/s TAPSE (M-mode): 1.5 cm LEFT ATRIUM             Index       RIGHT ATRIUM           Index LA diam:        3.70 cm 1.62 cm/m  RA Area:     15.40 cm LA Vol (A2C):   63.9 ml 27.90 ml/m RA Volume:   39.80 ml  17.38 ml/m LA Vol (A4C):   40.6 ml 17.73 ml/m LA Biplane Vol: 52.4 ml 22.88 ml/m   AORTA Ao Root diam: 3.50 cm MITRAL VALVE MV Area (PHT): 6.60 cm MV Decel Time: 115 msec MV E velocity: 66.00 cm/s MV A velocity: 39.80 cm/s MV E/A ratio:  1.66 Rozann Lesches MD Electronically signed by Rozann Lesches MD Signature Date/Time: 09/30/2019/4:58:16 PM    Final     Orson Eva, DO  [facility]  If 7PM-7AM, please contact night-coverage [email or web address] Password [email or web address] 10/04/2019, 12:49 PM   LOS: 6 days

## 2019-10-04 NOTE — Plan of Care (Signed)

## 2019-10-05 LAB — COMPREHENSIVE METABOLIC PANEL
ALT: 38 U/L (ref 0–44)
AST: 28 U/L (ref 15–41)
Albumin: 2.4 g/dL — ABNORMAL LOW (ref 3.5–5.0)
Alkaline Phosphatase: 49 U/L (ref 38–126)
Anion gap: 9 (ref 5–15)
BUN: 35 mg/dL — ABNORMAL HIGH (ref 8–23)
CO2: 31 mmol/L (ref 22–32)
Calcium: 7.9 mg/dL — ABNORMAL LOW (ref 8.9–10.3)
Chloride: 97 mmol/L — ABNORMAL LOW (ref 98–111)
Creatinine, Ser: 0.85 mg/dL (ref 0.61–1.24)
GFR calc Af Amer: >60 mL/min (ref >60)
GFR calc non Af Amer: >60 mL/min (ref >60)
Glucose, Bld: 204 mg/dL — ABNORMAL HIGH (ref 70–99)
Potassium: 4.5 mmol/L (ref 3.5–5.1)
Sodium: 137 mmol/L (ref 135–145)
Total Bilirubin: 0.8 mg/dL (ref 0.3–1.2)
Total Protein: 5.4 g/dL — ABNORMAL LOW (ref 6.5–8.1)

## 2019-10-05 LAB — CBC WITH DIFFERENTIAL/PLATELET
Abs Immature Granulocytes: 0.24 10*3/uL — ABNORMAL HIGH (ref 0.00–0.07)
Basophils Absolute: 0 10*3/uL (ref 0.0–0.1)
Basophils Relative: 0 %
Eosinophils Absolute: 0 10*3/uL (ref 0.0–0.5)
Eosinophils Relative: 0 %
HCT: 30 % — ABNORMAL LOW (ref 39.0–52.0)
Hemoglobin: 8.7 g/dL — ABNORMAL LOW (ref 13.0–17.0)
Immature Granulocytes: 2 %
Lymphocytes Relative: 2 %
Lymphs Abs: 0.2 10*3/uL — ABNORMAL LOW (ref 0.7–4.0)
MCH: 22.8 pg — ABNORMAL LOW (ref 26.0–34.0)
MCHC: 29 g/dL — ABNORMAL LOW (ref 30.0–36.0)
MCV: 78.5 fL — ABNORMAL LOW (ref 80.0–100.0)
Monocytes Absolute: 0.5 10*3/uL (ref 0.1–1.0)
Monocytes Relative: 4 %
Neutro Abs: 12.7 10*3/uL — ABNORMAL HIGH (ref 1.7–7.7)
Neutrophils Relative %: 92 %
Platelets: 377 10*3/uL (ref 150–400)
RBC: 3.82 MIL/uL — ABNORMAL LOW (ref 4.22–5.81)
RDW: 19.2 % — ABNORMAL HIGH (ref 11.5–15.5)
WBC: 13.6 10*3/uL — ABNORMAL HIGH (ref 4.0–10.5)
nRBC: 0.4 % — ABNORMAL HIGH (ref 0.0–0.2)

## 2019-10-05 LAB — C-REACTIVE PROTEIN: CRP: 0.8 mg/dL (ref <1.0)

## 2019-10-05 LAB — GLUCOSE, CAPILLARY
Glucose-Capillary: 212 mg/dL — ABNORMAL HIGH (ref 70–99)
Glucose-Capillary: 275 mg/dL — ABNORMAL HIGH (ref 70–99)
Glucose-Capillary: 304 mg/dL — ABNORMAL HIGH (ref 70–99)
Glucose-Capillary: 285 mg/dL — ABNORMAL HIGH (ref 70–99)

## 2019-10-05 LAB — D-DIMER, QUANTITATIVE: D-Dimer, Quant: 1.61 ug/mL-FEU — ABNORMAL HIGH (ref 0.00–0.50)

## 2019-10-05 LAB — FERRITIN: Ferritin: 35 ng/mL (ref 24–336)

## 2019-10-05 NOTE — TOC Progression Note (Signed)
Transition of Care [facility]) - Progression Note    Patient Details  Name: Stephen Wilcox MRN: [id number] Date of Birth: 04/04/1955  Transition of Care [facility]) CM/SW Contact  Ihor Gully, LCSW Phone Number: 10/05/2019, 4:04 PM  Clinical Narrative:    Spoke with daughter, [name]. Advised that that patient had been declined by all facilities due to either the facility not accepting his insurance or not accepting COVID+ residents. Daughter is agreeable to expand search area.  Email to Earma Reading with [facility] regarding lack of in network facilities.    Expected Discharge Plan: [place] Barriers to Discharge: Continued Medical Work up  Expected Discharge Plan and Services Expected Discharge Plan: [place] In-house Referral: Clinical Social Work   Post Acute Care Choice: [place] Living arrangements for the past 2 months: Apartment                                       Social Determinants of Health (SDOH) Interventions    Readmission Risk Interventions No flowsheet data found.

## 2019-10-05 NOTE — Progress Notes (Signed)
Inpatient Diabetes Program Recommendations  AACE/ADA: New Consensus Statement on Inpatient Glycemic Control (2015)  Target Ranges:  Prepandial:   less than 140 mg/dL      Peak postprandial:   less than 180 mg/dL (1-2 hours)      Critically ill patients:  140 - 180 mg/dL   Lab Results  Component Value Date   GLUCAP 275 (H) 10/05/2019   HGBA1C 6.9 (H) 09/28/2019    Review of Glycemic Control Results for Stephen Wilcox, Stephen Wilcox (MRN [id number]) as of 10/05/2019 15:19  Ref. Range 10/04/2019 16:49 10/04/2019 19:54 10/05/2019 08:26 10/05/2019 11:01  Glucose-Capillary Latest Ref Range: 70 - 99 mg/dL 241 (H) 189 (H) 212 (H) 275 (H)  Diabetes history: None- Hyperglycemia Outpatient Diabetes medications: None Current orders for Inpatient glycemic control:  Novolog resistant tid with meals and HS Novolog 20 units tid with meals Lantus 50 units q HS Solumedrol 125 mg IV q 12 hours Inpatient Diabetes Program Recommendations:    Please consider increasing Lantus to 60 units q HS.   Thanks,  Adah Perl, RN, BC-ADM Inpatient Diabetes Coordinator Pager [phone] ([id number])

## 2019-10-05 NOTE — Plan of Care (Signed)

## 2019-10-05 NOTE — Progress Notes (Signed)
PROGRESS NOTE  Stephen Wilcox UDJ:[id number] DOB: 1955-06-25 DOA: 09/28/2019 PCP: Jake Samples, PA-C  Brief History:  64 year old male without any documented significant chronic medical problems presenting to the ED on 09/24/2019 with lightheadedness malaise, and some generalized weakness.  Patient was diagnosed with positive Covid.  He had declined monoclonal antibody infusion.  On 09/28/2019, the patient presented back to the emergency department with worsening generalized weakness, dizziness, and cough and malaise.  He was noted to have oxygen saturation of 82% room air and placed on 2 L nasal cannula.  Was subsequently started on remdesivir and IV steroids.  At the time of admission, the patient was noted to have a hemoglobin of 7.3 which was a significant drop when compared to 09/24/2019 when his hemoglobin was 9.1.  Hemoccult was noted to be positive during admission.  GI was consulted to assist with management.  Dr. Laural Golden saw the patient.  He did not feel that the patient had any overt active GI bleeding.  He recommended PPI infusion for 72 hours and subsequently transitioned to oral Protonix.  He recommended endoscopic evaluation if the patient's hemoglobin continued to drop again requiring transfusion.  The patient was transfused 2 units PRBC.  His hemoglobin went up to 9.3 and has remained stable since then.  The patient's oxygen demand gradually increased to 6 L, but subsequently remained stable.  Blood cultures grew Staph epidermidis in 1 of 2 sets.  This was thought to be a contaminant.  Empiric antibiotics were discontinued.  On 09/29/2019, the patient was noted to have atrial fibrillation.  EKG confirmed atrial fibrillation.  The patient was started on oral diltiazem.  Echocardiogram on 09/30/2019 showed EF 55 to 60%, no WMA.  Assessment/Plan: Acute respiratory failure with hypoxia secondary to COVID-19 pneumonia -sepsis ruled out -Currently stable on 8 L nasal cannula, weaning  oxygen down to 4 L during the day -Oxygen saturation 90-92% -Increase Solu-Medrol to 125 mg IV twice daily -Continue vitamin C and zinc -CRP 19.4>> 15.8>> 7.9>> 3.6>> 1.7>> 1.0>>0.8 -Ferritin 59>> 70>> 95>> 90>> 67>> 53>>50 -D-dimer 1.89>> 1.48>> 1.26>> 1.13>> 1.31>> 1.48>>1.71 -finished 5 days remdesivir 10/03/19 -10/03/19 CTA chest--no PE; 4.8 cm lesion in the right lobe liver, incompletely characterized. Additional multiple soft tissue attenuation lesions along the lesser curvature/gastrohepatic ligament, largest measuring up to 4.6 cm in size  Gastric and Hepatic Masses -incidental finding on CTA chest -10/03/19 MR Liver--6.5 x 4.8 cm infiltrating gastric mass involving the fundus and numerous liver masses -GI consult--> -may need liver bx -patient and daughter updated  Symptomatic anemia/heme positive stool -Appreciate GI consult -Dr. Laural Golden followed the patient--> recommended IV Protonix then transition to p.o. Protonix -Will ultimately need endoscopy, likely to be performed as an outpatient -More urgent endoscopy if the patient's hemoglobin drops again -Transfused 2 units PRBC -Hemoglobin stable since transfusion -in retrospect, gastric mass may have been source of bleed  New onset atrial fibrillation -Currently rate controlled -Transition to long-acting diltiazem-->increase to 180 mg -Continue metoprolol -09/30/2019 echo EF 55 to 60%, no WMA -Start aspirin 81 mg daily -CHA2DS2-VASc = 1 -TSH--0.733 -hold ASA until evaluated by GI for possible endoscopy  Diabetes mellitus type 2 -Elevated CBGs secondary to steroids -09/28/2019 hemoglobin A1c 6.9 -Increase Lantus to 50 units -Resistant NovoLog sliding scale -Increase premeal insulin to 20 units  Microcytic anemia/Iron deficiency -iron saturation 3% -ferritin low despite COVID-19 -iron supplement when recovered from COVID-19  Thrombocytosis -due to iron deficiency and  acute medical illness  Class II  obesity -BMI 32.14 -Lifestyle modification  BPH -Continue tamsulosin  Bacteremia -Staph epidermidis 1 out of 2 sets -Represents contaminant  Generalized weakness -PT evaluation--> skilled nursing facility with which the patient and daughter agree      Status is: Inpatient  Remains inpatient appropriate because:IV treatments appropriate due to intensity of illness or inability to take PO   Dispo: The patient is from: Home  Anticipated d/c is to: SNF  Anticipated d/c date is: 2 day  Patient currently is not medically stable to d/c.        Family Communication:   Daughter updated 9/15  Consultants:  GI--Rehman  Code Status:  FULL   DVT Prophylaxis:  SCDs   Procedures: As Listed in Progress Note Above  Antibiotics: None     Subjective: Denies any shortness of breath.  Feels about the same as he did yesterday.  Objective: Vitals:   10/05/19 0822 10/05/19 1300 10/05/19 1432 10/05/19 1700  BP: 124/88 105/76    Pulse: (!) 108 (!) 106    Resp: 16 18    Temp: 97.7 F (36.5 C) 98.1 F (36.7 C)    TempSrc: Oral Oral    SpO2: 90% 91% 91% 92%  Weight:      Height:        Intake/Output Summary (Last 24 hours) at 10/05/2019 2033 Last data filed at 10/05/2019 1200 Gross per 24 hour  Intake --  Output 1150 ml  Net -1150 ml   Weight change:  Exam:  General exam: Alert, awake, oriented x 3 Respiratory system: Clear to auscultation. Respiratory effort normal. Cardiovascular system:RRR. No murmurs, rubs, gallops. Gastrointestinal system: Abdomen is nondistended, soft and nontender. No organomegaly or masses felt. Normal bowel sounds heard. Central nervous system: Alert and oriented. No focal neurological deficits. Extremities: No C/C/E, +pedal pulses Skin: No rashes, lesions or ulcers  Psychiatry: Judgement and insight appear normal. Mood & affect appropriate.      Data Reviewed: I have  personally reviewed following labs and imaging studies Basic Metabolic Panel: Recent Labs  Lab 09/29/19 0950 09/29/19 0950 09/30/19 0616 09/30/19 0616 10/01/19 0714 10/02/19 0742 10/03/19 0730 10/04/19 0650 10/05/19 0629  NA 133*   < > 133*   < > 135 133* 134* 136 137  K 4.8   < > 3.7   < > 4.4 4.4 4.3 4.3 4.5  CL 94*   < > 95*   < > 93* 93* 94* 97* 97*  CO2 27   < > 29   < > 30 30 29 31 31   GLUCOSE 236*   < > 291*   < > 375* 367* 404* 234* 204*  BUN 18   < > 20   < > 25* 31* 41* 36* 35*  CREATININE 0.73   < > 0.70   < > 0.83 0.91 0.93 0.86 0.85  CALCIUM 7.9*   < > 8.0*   < > 8.3* 8.2* 8.4* 8.1* 7.9*  MG 2.4  --  2.3  --  2.4 2.5* 2.6*  --   --   PHOS 3.0  --  2.4*  --  2.5 3.3 3.4  --   --    < > = values in this interval not displayed.   Liver Function Tests: Recent Labs  Lab 10/01/19 0714 10/02/19 0742 10/03/19 0730 10/04/19 0650 10/05/19 0629  AST 26 30 34 36 28  ALT 31 34 43 42 38  ALKPHOS 49 47 50  46 49  BILITOT 0.5 0.6 0.8 0.6 0.8  PROT 5.8* 5.6* 5.7* 5.4* 5.4*  ALBUMIN 2.3* 2.3* 2.5* 2.4* 2.4*   No results for input(s): LIPASE, AMYLASE in the last 168 hours. No results for input(s): AMMONIA in the last 168 hours. Coagulation Profile: No results for input(s): INR, PROTIME in the last 168 hours. CBC: Recent Labs  Lab 10/01/19 0714 10/02/19 0742 10/03/19 0730 10/04/19 0650 10/05/19 0629  WBC 14.1* 15.7* 20.5* 18.1* 13.6*  NEUTROABS 13.3* 14.3* 18.9* 16.8* 12.7*  HGB 9.5* 9.5* 9.9* 9.5* 8.7*  HCT 31.8* 32.5* 33.0* 31.8* 30.0*  MCV 78.1* 79.3* 78.8* 78.5* 78.5*  PLT 591* 577* 514* 463* 377   Cardiac Enzymes: No results for input(s): CKTOTAL, CKMB, CKMBINDEX, TROPONINI in the last 168 hours. BNP: Invalid input(s): POCBNP CBG: Recent Labs  Lab 10/04/19 1649 10/04/19 1954 10/05/19 0826 10/05/19 1101 10/05/19 1656  GLUCAP 241* 189* 212* 275* 304*   HbA1C: No results for input(s): HGBA1C in the last 72 hours. Urine analysis:    Component  Value Date/Time   COLORURINE YELLOW 08/15/2018 1009   APPEARANCEUR CLEAR 08/15/2018 1009   LABSPEC 1.013 08/15/2018 1009   PHURINE 6.0 08/15/2018 1009   GLUCOSEU NEGATIVE 08/15/2018 1009   HGBUR MODERATE (A) 08/15/2018 1009   BILIRUBINUR NEGATIVE 08/15/2018 1009   [place] 08/15/2018 1009   PROTEINUR NEGATIVE 08/15/2018 1009   NITRITE NEGATIVE 08/15/2018 1009   LEUKOCYTESUR NEGATIVE 08/15/2018 1009   Sepsis Labs: @LABRCNTIP (procalcitonin:4,lacticidven:4) ) Recent Results (from the past 240 hour(s))  Blood Culture (routine x 2)     Status: Abnormal   Collection Time: 09/28/19 11:19 PM   Specimen: BLOOD  Result Value Ref Range Status   Specimen Description   Final    BLOOD LEFT ANTECUBITAL Performed at [facility], [address] [place], [place] [address]    Special Requests   Final    BOTTLES DRAWN AEROBIC AND ANAEROBIC Blood Culture adequate volume Performed at [facility], [address] [place], [place] [address]    Culture  Setup Time   Final    ANAEROBIC BOTTLE ONLY GRAM POSITIVE COCCI Gram Stain Report Called to,Read Back By and Verified With: T EASTER,RN@[id number]  09/29/19 MKELLY CRITICAL RESULT CALLED TO, READ BACK BY AND VERIFIED WITH: K NICHOLS RN 09/30/19 0611 JDW    Culture (A)  Final    STAPHYLOCOCCUS EPIDERMIDIS THE SIGNIFICANCE OF ISOLATING THIS ORGANISM FROM A SINGLE SET OF BLOOD CULTURES WHEN MULTIPLE SETS ARE DRAWN IS UNCERTAIN. PLEASE NOTIFY THE MICROBIOLOGY DEPARTMENT WITHIN ONE WEEK IF SPECIATION AND SENSITIVITIES ARE REQUIRED. Performed at [facility], [place] [address], [place], [place] [address]    Report Status 10/02/2019 FINAL  Final  Blood Culture ID Panel (Reflexed)     Status: Abnormal   Collection Time: 09/28/19 11:19 PM  Result Value Ref Range Status   Enterococcus faecalis NOT DETECTED NOT DETECTED Final   Enterococcus Faecium NOT DETECTED NOT DETECTED Final   Listeria monocytogenes NOT DETECTED NOT DETECTED Final    Staphylococcus species DETECTED (A) NOT DETECTED Final    Comment: CRITICAL RESULT CALLED TO, READ BACK BY AND VERIFIED WITH: Merideth Abbey RN 09/30/19 3570 JDW    Staphylococcus aureus (BCID) NOT DETECTED NOT DETECTED Final   Staphylococcus epidermidis DETECTED (A) NOT DETECTED Final    Comment: Methicillin (oxacillin) resistant coagulase negative staphylococcus. Possible blood culture contaminant (unless isolated from more than one blood culture draw or clinical case suggests pathogenicity). No antibiotic treatment is indicated for blood  culture contaminants. CRITICAL RESULT CALLED TO, READ BACK BY AND VERIFIED WITH: K NICHOLS RN 09/30/19 9767 JDW    Staphylococcus lugdunensis NOT DETECTED NOT DETECTED Final   Streptococcus species NOT DETECTED NOT DETECTED Final   Streptococcus agalactiae NOT DETECTED NOT DETECTED Final   Streptococcus pneumoniae NOT DETECTED NOT DETECTED Final   Streptococcus pyogenes NOT DETECTED NOT DETECTED Final   A.calcoaceticus-baumannii NOT DETECTED NOT DETECTED Final   Bacteroides fragilis NOT DETECTED NOT DETECTED Final   Enterobacterales NOT DETECTED NOT DETECTED Final   Enterobacter cloacae complex NOT DETECTED NOT DETECTED Final   Escherichia coli NOT DETECTED NOT DETECTED Final   Klebsiella aerogenes NOT DETECTED NOT DETECTED Final   Klebsiella oxytoca NOT DETECTED NOT DETECTED Final   Klebsiella pneumoniae NOT DETECTED NOT DETECTED Final   Proteus species NOT DETECTED NOT DETECTED Final   Salmonella species NOT DETECTED NOT DETECTED Final   Serratia marcescens NOT DETECTED NOT DETECTED Final   Haemophilus influenzae NOT DETECTED NOT DETECTED Final   Neisseria meningitidis NOT DETECTED NOT DETECTED Final   Pseudomonas aeruginosa NOT DETECTED NOT DETECTED Final   Stenotrophomonas maltophilia NOT DETECTED NOT DETECTED Final   Candida albicans NOT DETECTED NOT DETECTED Final   Candida auris NOT DETECTED NOT DETECTED Final   Candida glabrata NOT DETECTED NOT  DETECTED Final   Candida krusei NOT DETECTED NOT DETECTED Final   Candida parapsilosis NOT DETECTED NOT DETECTED Final   Candida tropicalis NOT DETECTED NOT DETECTED Final   Cryptococcus neoformans/gattii NOT DETECTED NOT DETECTED Final   Methicillin resistance mecA/C DETECTED (A) NOT DETECTED Final    Comment: CRITICAL RESULT CALLED TO, READ BACK BY AND VERIFIED WITH: Merideth Abbey RN 09/30/19 3419 JDW Performed at [facility], [address], [place], [place] [address]   Blood Culture (routine x 2)     Status: None   Collection Time: 09/28/19 11:34 PM   Specimen: BLOOD  Result Value Ref Range Status   Specimen Description BLOOD RIGHT ANTECUBITAL  Final   Special Requests   Final    BOTTLES DRAWN AEROBIC AND ANAEROBIC Blood Culture adequate volume   Culture   Final    NO GROWTH 5 DAYS Performed at [facility], [address], [place], [place] [address]    Report Status 10/03/2019 FINAL  Final     Scheduled Meds: . sodium chloride   Intravenous Once  . albuterol  2 puff Inhalation TID  . vitamin C  500 mg Oral Daily  . diltiazem  120 mg Oral Daily  . insulin aspart  0-20 Units Subcutaneous TID WC  . insulin aspart  0-5 Units Subcutaneous QHS  . insulin aspart  20 Units Subcutaneous TID WC  . insulin glargine  50 Units Subcutaneous QHS  . methylPREDNISolone (SOLU-MEDROL) injection  125 mg Intravenous Q12H  . metoprolol tartrate  25 mg Oral TID  . pantoprazole  40 mg Intravenous Q12H  . tamsulosin  0.4 mg Oral QPC supper  . zinc sulfate  220 mg Oral Daily   Continuous Infusions:  Procedures/Studies: DG Chest 2 View  Result Date: 09/15/2019 CLINICAL DATA:  Acute chest pain. EXAM: CHEST - 2 VIEW COMPARISON:  None. FINDINGS: The heart size and mediastinal contours are within normal limits. No pneumothorax or pleural effusion is noted. Right lung is clear. Minimal left basilar subsegmental atelectasis or scarring is noted. The visualized skeletal structures are unremarkable.  IMPRESSION: Minimal left basilar subsegmental atelectasis or scarring. Electronically Signed   By: Marijo Conception  M.D.   On: 09/15/2019 16:58   DG Ribs Unilateral Right  Result Date: 09/15/2019 CLINICAL DATA:  Acute right chest pain. EXAM: RIGHT RIBS - 2 VIEW COMPARISON:  None. FINDINGS: No fracture or other bone lesions are seen involving the ribs. IMPRESSION: Negative. Electronically Signed   By: Marijo Conception M.D.   On: 09/15/2019 16:57   CT ANGIO CHEST PE W OR WO CONTRAST  Result Date: 10/03/2019 CLINICAL DATA:  COVID-19 positive, weakness, dizziness and shortness of breath EXAM: CT ANGIOGRAPHY CHEST WITH CONTRAST TECHNIQUE: Multidetector CT imaging of the chest was performed using the standard protocol during bolus administration of intravenous contrast. Multiplanar CT image reconstructions and MIPs were obtained to evaluate the vascular anatomy. CONTRAST:  111mL OMNIPAQUE IOHEXOL 350 MG/ML SOLN COMPARISON:  Radiograph 09/28/2019, CT abdomen pelvis 08/15/2018 FINDINGS: Cardiovascular: Satisfactory opacification the pulmonary arteries to the segmental level. No pulmonary artery filling defects are identified. Central pulmonary arteries are normal caliber. Suboptimal opacification of the thoracic aorta. No discernible acute abnormality of the thoracic aorta proximal great vessels. Aberrant right subclavian artery. Borderline cardiac enlargement. Slight reflux of contrast into the IVC. Mediastinum/Nodes: No mediastinal fluid or gas. Normal thyroid gland and thoracic inlet. No acute abnormality of the trachea or esophagus. No worrisome mediastinal, hilar or axillary adenopathy. Lungs/Pleura: Mixed areas of heterogeneous consolidative and ground-glass opacity throughout both lungs with a basilar and peripheral predominance. No pneumothorax or visible effusion. Diffuse airways thickening. Upper Abdomen: Indeterminate intermediate attenuation (38 HU) lesion measuring up to 8.5 cm in maximum transaxial  dimension within the right lobe liver (4/80). Cholelithiasis with a partially calcified gallstone towards the gallbladder neck. Multiple soft tissue attenuation lesions present along the lesser curvature/gastrohepatic ligament. Largest measuring up to 4.6 cm in size (4/91). Partially exophytic 0.7 cm lesion arising from the upper pole left kidney, corresponding with a previously seen fluid attenuation cyst on prior comparison CT, could reflect intracystic hemorrhage/proteinaceous debris. Musculoskeletal: Multilevel degenerative changes are present in the imaged portions of the spine. No acute osseous abnormality or suspicious osseous lesion. Probable intramuscular lipoma of the right infraspinatus measuring up to 6.9 by 3.5 cm (4/20). No other acute or worrisome chest wall lesions. Review of the MIP images confirms the above findings. IMPRESSION: 1. No evidence of acute pulmonary artery filling defects. 2. Mixed areas of heterogeneous consolidative and ground-glass opacity throughout both lungs with a basilar and peripheral predominance. Findings are compatible with multifocal pneumonia compatible with a COVID-19 etiology. 3. Indeterminate 4.8 cm lesion in the right lobe liver, incompletely characterized. Additional multiple soft tissue attenuation lesions along the lesser curvature/gastrohepatic ligament, largest measuring up to 4.6 cm in size. These findings are new from comparison abdominal CT 08/15/2018 and could raise concern for potential malignancy. Could consider further evaluation with abdominal MR with contrast. 4. Additional 7 mm hyperdense lesion arising from the upper pole left kidney, could reflect proteinaceous cysts, Ob also be better evaluated on dedicated abdominal imaging. 5. Cholelithiasis. 6. Aberrant right subclavian artery. These results will be called to the ordering clinician or representative by the Radiologist Assistant, and communication documented in the PACS or [employer].  Electronically Signed   By: Lovena Le M.D.   On: 10/03/2019 15:38   MR LIVER W WO CONTRAST  Result Date: 10/04/2019 CLINICAL DATA:  Evaluate liver lesions and upper abdominal adenopathy seen on recent chest CT. EXAM: MRI ABDOMEN WITHOUT AND WITH CONTRAST TECHNIQUE: Multiplanar multisequence MR imaging of the abdomen was performed both before and after the administration  of intravenous contrast. CONTRAST:  82mL GADAVIST GADOBUTROL 1 MMOL/ML IV SOLN COMPARISON:  CT abdomen/pelvis 08/15/2018 FINDINGS: Lower chest: The lungs demonstrate changes of COVID pneumonia as demonstrated on today's chest CT. Hepatobiliary: Numerous diffusion positive hepatic metastatic lesions are noted. Segment 4A lesion measures 3.7 cm on image 7/8. 5 cm lesion and segment 7 on image 12/8. 5 cm lesion in segment 6 on image 18/8. Several other smaller lesions are noted. No intrahepatic biliary dilatation. There is diffuse fatty infiltration of the liver noted. A 2 cm gallstone is noted in the gallbladder. No common bile duct dilatation. Pancreas:  No mass, inflammation or ductal dilatation. Spleen:  Normal size. No focal lesions. Adrenals/Urinary Tract: The adrenal glands and kidneys are unremarkable. Stomach/Bowel: There is a large infiltrating gastric mass involving the fundal region 6.5 x 4.8 cm and is diffusion positive. Moderate contrast enhancement is noted. Adjacent gastrohepatic ligament lymphadenopathy with the largest node measuring 4.6 cm. Vascular/Lymphatic: The aorta is normal in caliber. No dissection. The branch vessels are patent. No retroperitoneal lymphadenopathy. Other:  No ascites or abdominal wall hernia. Musculoskeletal: No worrisome bone lesions. IMPRESSION: 1. 6.5 x 4.8 cm infiltrating gastric mass involving the fundal region. Associated gastrohepatic ligament lymphadenopathy. 2. Numerous hepatic metastatic lesions. 3. Cholelithiasis. Electronically Signed   By: Marijo Sanes M.D.   On: 10/04/2019 06:26   DG  Chest Port 1 View  Result Date: 09/28/2019 CLINICAL DATA:  COVID EXAM: PORTABLE CHEST 1 VIEW COMPARISON:  09/24/2019 FINDINGS: Development of patchy bilateral airspace opacities. No pleural effusion. Stable cardiomediastinal silhouette. No pneumothorax. IMPRESSION: Development of patchy bilateral airspace opacities, consistent with bilateral pneumonia. Electronically Signed   By: Donavan Foil M.D.   On: 09/28/2019 23:14   DG Chest Port 1 View  Result Date: 09/24/2019 CLINICAL DATA:  COVID pneumonia, weakness, fatigue EXAM: PORTABLE CHEST 1 VIEW COMPARISON:  09/15/2019 FINDINGS: The lungs are symmetrically expanded. Minimal left basilar atelectasis is again noted. No superimposed confluent pulmonary infiltrate. No pneumothorax or pleural effusion. Cardiac size within normal limits. The pulmonary vascularity is normal. No acute bone abnormality. IMPRESSION: Minimal left basilar atelectasis, unchanged. No superimposed confluent pulmonary infiltrate. Electronically Signed   By: Fidela Salisbury MD   On: 09/24/2019 20:15   ECHOCARDIOGRAM COMPLETE  Result Date: 09/30/2019    ECHOCARDIOGRAM REPORT   Patient Name:   HUTCHINSON ISENBERG Date of Exam: 09/30/2019 Medical Rec #:  [id number]         Height:       72.0 in Accession #:    [id number]        Weight:       237.0 lb Date of Birth:  1955/11/09        BSA:          2.290 m Patient Age:    48 years          BP:           95/60 mmHg Patient Gender: M                 HR:           126 bpm. Exam Location:  [facility] Procedure: 2D Echo Indications:    Endocarditis I38  History:        Patient has no prior history of Echocardiogram examinations.                 Risk Factors:Non-Smoker. Pneumonia due to COVID-19 virus,  Sepsis, Lactic Acidosis.  Sonographer:    Leavy Cella RDCS (AE) Referring Phys: [id number] OLADAPO ADEFESO IMPRESSIONS  1. Left ventricular ejection fraction, by estimation, is 55 to 60%. The left ventricle has normal function. The left  ventricle has no regional wall motion abnormalities. There is moderate left ventricular hypertrophy. Left ventricular diastolic parameters are indeterminate.  2. Right ventricular systolic function is normal. The right ventricular size is normal. Tricuspid regurgitation signal is inadequate for assessing PA pressure.  3. The mitral valve is grossly normal. No evidence of mitral valve regurgitation.  4. The aortic valve is tricuspid. Aortic valve regurgitation is not visualized.  5. The inferior vena cava is normal in size with <50% respiratory variability, suggesting right atrial pressure of 8 mmHg.  6. Views are somewhat limited, but no definite valvular vegetations are visualized. FINDINGS  Left Ventricle: Left ventricular ejection fraction, by estimation, is 55 to 60%. The left ventricle has normal function. The left ventricle has no regional wall motion abnormalities. The left ventricular internal cavity size was normal in size. There is  moderate left ventricular hypertrophy. Left ventricular diastolic parameters are indeterminate. Right Ventricle: The right ventricular size is normal. No increase in right ventricular wall thickness. Right ventricular systolic function is normal. Tricuspid regurgitation signal is inadequate for assessing PA pressure. Left Atrium: Left atrial size was normal in size. Right Atrium: Right atrial size was normal in size. Pericardium: There is no evidence of pericardial effusion. Mitral Valve: The mitral valve is grossly normal. No evidence of mitral valve regurgitation. Tricuspid Valve: The tricuspid valve is grossly normal. Tricuspid valve regurgitation is trivial. Aortic Valve: The aortic valve is tricuspid. Aortic valve regurgitation is not visualized. Pulmonic Valve: The pulmonic valve was grossly normal. Pulmonic valve regurgitation is trivial. Aorta: The aortic root is normal in size and structure. Venous: The inferior vena cava is normal in size with less than 50%  respiratory variability, suggesting right atrial pressure of 8 mmHg. IAS/Shunts: No atrial level shunt detected by color flow Doppler.  LEFT VENTRICLE PLAX 2D LVIDd:         4.37 cm Diastology LVIDs:         2.20 cm LV e' medial:    7.18 cm/s LV PW:         1.72 cm LV E/e' medial:  9.2 LV IVS:        1.46 cm LV e' lateral:   12.90 cm/s                        LV E/e' lateral: 5.1  RIGHT VENTRICLE RV S prime:     16.00 cm/s TAPSE (M-mode): 1.5 cm LEFT ATRIUM             Index       RIGHT ATRIUM           Index LA diam:        3.70 cm 1.62 cm/m  RA Area:     15.40 cm LA Vol (A2C):   63.9 ml 27.90 ml/m RA Volume:   39.80 ml  17.38 ml/m LA Vol (A4C):   40.6 ml 17.73 ml/m LA Biplane Vol: 52.4 ml 22.88 ml/m   AORTA Ao Root diam: 3.50 cm MITRAL VALVE MV Area (PHT): 6.60 cm MV Decel Time: 115 msec MV E velocity: 66.00 cm/s MV A velocity: 39.80 cm/s MV E/A ratio:  1.66 Rozann Lesches MD Electronically signed by Rozann Lesches MD Signature Date/Time: 09/30/2019/4:58:16 PM  Final     Kathie Dike, MD  [facility]  If 7PM-7AM, please contact night-coverage [email or web address]  10/05/2019, 8:33 PM   LOS: 7 days

## 2019-10-06 LAB — CBC WITH DIFFERENTIAL/PLATELET
Abs Immature Granulocytes: 0.32 10*3/uL — ABNORMAL HIGH (ref 0.00–0.07)
Basophils Absolute: 0 10*3/uL (ref 0.0–0.1)
Basophils Relative: 0 %
Eosinophils Absolute: 0 10*3/uL (ref 0.0–0.5)
Eosinophils Relative: 0 %
HCT: 28.4 % — ABNORMAL LOW (ref 39.0–52.0)
Hemoglobin: 8.4 g/dL — ABNORMAL LOW (ref 13.0–17.0)
Immature Granulocytes: 3 %
Lymphocytes Relative: 1 %
Lymphs Abs: 0.2 10*3/uL — ABNORMAL LOW (ref 0.7–4.0)
MCH: 23.3 pg — ABNORMAL LOW (ref 26.0–34.0)
MCHC: 29.6 g/dL — ABNORMAL LOW (ref 30.0–36.0)
MCV: 78.7 fL — ABNORMAL LOW (ref 80.0–100.0)
Monocytes Absolute: 0.6 10*3/uL (ref 0.1–1.0)
Monocytes Relative: 5 %
Neutro Abs: 11.9 10*3/uL — ABNORMAL HIGH (ref 1.7–7.7)
Neutrophils Relative %: 91 %
Platelets: 329 10*3/uL (ref 150–400)
RBC: 3.61 MIL/uL — ABNORMAL LOW (ref 4.22–5.81)
RDW: 19.5 % — ABNORMAL HIGH (ref 11.5–15.5)
WBC: 13 10*3/uL — ABNORMAL HIGH (ref 4.0–10.5)
nRBC: 0.4 % — ABNORMAL HIGH (ref 0.0–0.2)

## 2019-10-06 LAB — C-REACTIVE PROTEIN: CRP: 0.6 mg/dL (ref <1.0)

## 2019-10-06 LAB — COMPREHENSIVE METABOLIC PANEL
ALT: 36 U/L (ref 0–44)
AST: 29 U/L (ref 15–41)
Albumin: 2.2 g/dL — ABNORMAL LOW (ref 3.5–5.0)
Alkaline Phosphatase: 49 U/L (ref 38–126)
Anion gap: 5 (ref 5–15)
BUN: 33 mg/dL — ABNORMAL HIGH (ref 8–23)
CO2: 32 mmol/L (ref 22–32)
Calcium: 7.7 mg/dL — ABNORMAL LOW (ref 8.9–10.3)
Chloride: 98 mmol/L (ref 98–111)
Creatinine, Ser: 0.85 mg/dL (ref 0.61–1.24)
GFR calc Af Amer: >60 mL/min (ref >60)
GFR calc non Af Amer: >60 mL/min (ref >60)
Glucose, Bld: 186 mg/dL — ABNORMAL HIGH (ref 70–99)
Potassium: 4.4 mmol/L (ref 3.5–5.1)
Sodium: 135 mmol/L (ref 135–145)
Total Bilirubin: 0.6 mg/dL (ref 0.3–1.2)
Total Protein: 5.1 g/dL — ABNORMAL LOW (ref 6.5–8.1)

## 2019-10-06 LAB — GLUCOSE, CAPILLARY
Glucose-Capillary: 170 mg/dL — ABNORMAL HIGH (ref 70–99)
Glucose-Capillary: 234 mg/dL — ABNORMAL HIGH (ref 70–99)
Glucose-Capillary: 241 mg/dL — ABNORMAL HIGH (ref 70–99)
Glucose-Capillary: 262 mg/dL — ABNORMAL HIGH (ref 70–99)

## 2019-10-06 LAB — FERRITIN: Ferritin: 30 ng/mL (ref 24–336)

## 2019-10-06 LAB — D-DIMER, QUANTITATIVE: D-Dimer, Quant: 1.5 ug/mL-FEU — ABNORMAL HIGH (ref 0.00–0.50)

## 2019-10-06 MED ORDER — PREDNISONE 20 MG PO TABS
40.0000 mg | ORAL_TABLET | Freq: Every day | ORAL | Status: DC
  Administered 2019-10-07 – 2019-10-10 (×4): 40 mg via ORAL
  Filled 2019-10-06 (×4): qty 2

## 2019-10-06 NOTE — Progress Notes (Signed)
PT Cancellation Note  Patient Details Name: Stephen Wilcox MRN: [id number] DOB: 12-11-1955   Cancelled Treatment:    Reason Eval/Treat Not Completed: Patient declined, no reason specified.  Patient declined therapy secondary to c/o fatigue - RN notified.   3:47 PM, 10/06/19 Lonell Grandchild, MPT Physical Therapist with [phone] office [phone] mobile phone

## 2019-10-06 NOTE — Progress Notes (Signed)
PROGRESS NOTE  Stephen Wilcox XNA:[id number] DOB: 1955-02-19 DOA: 09/28/2019 PCP: Jake Samples, PA-C  Brief History:  64 year old male without any documented significant chronic medical problems presenting to the ED on 09/24/2019 with lightheadedness malaise, and some generalized weakness.  Patient was diagnosed with positive Covid.  He had declined monoclonal antibody infusion.  On 09/28/2019, the patient presented back to the emergency department with worsening generalized weakness, dizziness, and cough and malaise.  He was noted to have oxygen saturation of 82% room air and placed on 2 L nasal cannula.  Was subsequently started on remdesivir and IV steroids.  At the time of admission, the patient was noted to have a hemoglobin of 7.3 which was a significant drop when compared to 09/24/2019 when his hemoglobin was 9.1.  Hemoccult was noted to be positive during admission.  GI was consulted to assist with management.  Dr. Laural Golden saw the patient.  He did not feel that the patient had any overt active GI bleeding.  He recommended PPI infusion for 72 hours and subsequently transitioned to oral Protonix.  He recommended endoscopic evaluation if the patient's hemoglobin continued to drop again requiring transfusion.  The patient was transfused 2 units PRBC.  His hemoglobin went up to 9.3 and has remained stable since then.  The patient's oxygen demand gradually increased to 6 L, but subsequently remained stable.  Blood cultures grew Staph epidermidis in 1 of 2 sets.  This was thought to be a contaminant.  Empiric antibiotics were discontinued.  On 09/29/2019, the patient was noted to have atrial fibrillation.  EKG confirmed atrial fibrillation.  The patient was started on oral diltiazem.  Echocardiogram on 09/30/2019 showed EF 55 to 60%, no WMA.  Assessment/Plan: Acute respiratory failure with hypoxia secondary to COVID-19 pneumonia -sepsis ruled out -Patient was weaned off of oxygen and is  currently on room air -Oxygen saturation 90-92% -Continue steroids, day 7.  Will change Solu-Medrol to prednisone -Continue vitamin C and zinc -CRP 19.4>> 15.8>> 7.9>> 3.6>> 1.7>> 1.0>>0.8 -Ferritin 59>> 70>> 95>> 90>> 67>> 53>>50 -D-dimer 1.89>> 1.48>> 1.26>> 1.13>> 1.31>> 1.48>>1.71 -finished 5 days remdesivir 10/03/19 -10/03/19 CTA chest--no PE; 4.8 cm lesion in the right lobe liver, incompletely characterized. Additional multiple soft tissue attenuation lesions along the lesser curvature/gastrohepatic ligament, largest measuring up to 4.6 cm in size  Gastric and Hepatic Masses -incidental finding on CTA chest -10/03/19 MR Liver--6.5 x 4.8 cm infiltrating gastric mass involving the fundus and numerous liver masses -GI consult--> -may need liver bx -patient and daughter updated  Symptomatic anemia/heme positive stool -Appreciate GI consult -Dr. Laural Golden followed the patient--> recommended IV Protonix then transition to p.o. Protonix -Will ultimately need endoscopy, likely to be performed as an outpatient -More urgent endoscopy if the patient's hemoglobin drops again -Transfused 2 units PRBC -Hemoglobin has been slowly trending down.  Recheck in a.m. -in retrospect, gastric mass may have been source of bleed  New onset atrial fibrillation -Currently rate controlled -Transition to long-acting diltiazem-->increase to 180 mg -Continue metoprolol -09/30/2019 echo EF 55 to 60%, no WMA -Start aspirin 81 mg daily -CHA2DS2-VASc = 1 -TSH--0.733 -hold ASA until evaluated by GI for possible endoscopy  Diabetes mellitus type 2 -Elevated CBGs secondary to steroids -09/28/2019 hemoglobin A1c 6.9 -Continue Lantus at 50 units -Resistant NovoLog sliding scale -Increase premeal insulin to 20 units  Microcytic anemia/Iron deficiency -iron saturation 3% -ferritin low despite COVID-19 -iron supplement when recovered from COVID-19  Thrombocytosis -due  to iron deficiency and acute medical  illness  Class II obesity -BMI 32.14 -Lifestyle modification  BPH -Continue tamsulosin  Bacteremia -Staph epidermidis 1 out of 2 sets -Represents contaminant  Generalized weakness -PT evaluation--> skilled nursing facility with which the patient and daughter agree      Status is: Inpatient  Remains inpatient appropriate because:IV treatments appropriate due to intensity of illness or inability to take PO   Dispo: The patient is from: Home  Anticipated d/c is to: SNF  Anticipated d/c date is: 2 day  Patient currently is not medically stable to d/c.        Family Communication:   Daughter updated 9/16  Consultants:  GI--Rehman  Code Status:  FULL   DVT Prophylaxis:  SCDs   Procedures: As Listed in Progress Note Above  Antibiotics: None     Subjective: He is frustrated by having to stay in the hospital.  Denies any shortness of breath.  Objective: Vitals:   10/06/19 0410 10/06/19 0744 10/06/19 1327 10/06/19 1506  BP: 108/82   121/77  Pulse: 80   (!) 108  Resp: 18   18  Temp:    98.9 F (37.2 C)  TempSrc: Oral   Oral  SpO2: 98% (!) 85% (!) 87% 90%  Weight:      Height:        Intake/Output Summary (Last 24 hours) at 10/06/2019 1827 Last data filed at 10/06/2019 0800 Gross per 24 hour  Intake --  Output 200 ml  Net -200 ml   Weight change:  Exam:  General exam: Alert, awake, oriented x 3 Respiratory system: Clear to auscultation. Respiratory effort normal. Cardiovascular system:RRR. No murmurs, rubs, gallops. Gastrointestinal system: Abdomen is nondistended, soft and nontender. No organomegaly or masses felt. Normal bowel sounds heard. Central nervous system: Alert and oriented. No focal neurological deficits. Extremities: No C/C/E, +pedal pulses Skin: No rashes, lesions or ulcers  Psychiatry: Judgement and insight appear normal. Mood & affect appropriate.     Data  Reviewed: I have personally reviewed following labs and imaging studies Basic Metabolic Panel: Recent Labs  Lab 09/30/19 0616 09/30/19 0616 10/01/19 0714 10/01/19 0714 10/02/19 0742 10/03/19 0730 10/04/19 0650 10/05/19 0629 10/06/19 0716  NA 133*   < > 135   < > 133* 134* 136 137 135  K 3.7   < > 4.4   < > 4.4 4.3 4.3 4.5 4.4  CL 95*   < > 93*   < > 93* 94* 97* 97* 98  CO2 29   < > 30   < > 30 29 31 31  32  GLUCOSE 291*   < > 375*   < > 367* 404* 234* 204* 186*  BUN 20   < > 25*   < > 31* 41* 36* 35* 33*  CREATININE 0.70   < > 0.83   < > 0.91 0.93 0.86 0.85 0.85  CALCIUM 8.0*   < > 8.3*   < > 8.2* 8.4* 8.1* 7.9* 7.7*  MG 2.3  --  2.4  --  2.5* 2.6*  --   --   --   PHOS 2.4*  --  2.5  --  3.3 3.4  --   --   --    < > = values in this interval not displayed.   Liver Function Tests: Recent Labs  Lab 10/02/19 0742 10/03/19 0730 10/04/19 0650 10/05/19 0629 10/06/19 0716  AST 30 34 36 28 29  ALT 34 43 42  38 36  ALKPHOS 47 50 46 49 49  BILITOT 0.6 0.8 0.6 0.8 0.6  PROT 5.6* 5.7* 5.4* 5.4* 5.1*  ALBUMIN 2.3* 2.5* 2.4* 2.4* 2.2*   No results for input(s): LIPASE, AMYLASE in the last 168 hours. No results for input(s): AMMONIA in the last 168 hours. Coagulation Profile: No results for input(s): INR, PROTIME in the last 168 hours. CBC: Recent Labs  Lab 10/02/19 0742 10/03/19 0730 10/04/19 0650 10/05/19 0629 10/06/19 0716  WBC 15.7* 20.5* 18.1* 13.6* 13.0*  NEUTROABS 14.3* 18.9* 16.8* 12.7* 11.9*  HGB 9.5* 9.9* 9.5* 8.7* 8.4*  HCT 32.5* 33.0* 31.8* 30.0* 28.4*  MCV 79.3* 78.8* 78.5* 78.5* 78.7*  PLT 577* 514* 463* 377 329   Cardiac Enzymes: No results for input(s): CKTOTAL, CKMB, CKMBINDEX, TROPONINI in the last 168 hours. BNP: Invalid input(s): POCBNP CBG: Recent Labs  Lab 10/05/19 1656 10/05/19 2143 10/06/19 0821 10/06/19 1232 10/06/19 1619  GLUCAP 304* 285* 170* 234* 241*   HbA1C: No results for input(s): HGBA1C in the last 72 hours. Urine analysis:     Component Value Date/Time   COLORURINE YELLOW 08/15/2018 1009   APPEARANCEUR CLEAR 08/15/2018 1009   LABSPEC 1.013 08/15/2018 1009   PHURINE 6.0 08/15/2018 1009   GLUCOSEU NEGATIVE 08/15/2018 1009   HGBUR MODERATE (A) 08/15/2018 1009   BILIRUBINUR NEGATIVE 08/15/2018 1009   [place] 08/15/2018 1009   PROTEINUR NEGATIVE 08/15/2018 1009   NITRITE NEGATIVE 08/15/2018 1009   LEUKOCYTESUR NEGATIVE 08/15/2018 1009   Sepsis Labs: @LABRCNTIP (procalcitonin:4,lacticidven:4) ) Recent Results (from the past 240 hour(s))  Blood Culture (routine x 2)     Status: Abnormal   Collection Time: 09/28/19 11:19 PM   Specimen: BLOOD  Result Value Ref Range Status   Specimen Description   Final    BLOOD LEFT ANTECUBITAL Performed at [facility], [address] [place], [place] [address]    Special Requests   Final    BOTTLES DRAWN AEROBIC AND ANAEROBIC Blood Culture adequate volume Performed at [facility], [address] [place], [place] [address]    Culture  Setup Time   Final    ANAEROBIC BOTTLE ONLY GRAM POSITIVE COCCI Gram Stain Report Called to,Read Back By and Verified With: T EASTER,RN@[id number]  09/29/19 MKELLY CRITICAL RESULT CALLED TO, READ BACK BY AND VERIFIED WITH: K NICHOLS RN 09/30/19 0611 JDW    Culture (A)  Final    STAPHYLOCOCCUS EPIDERMIDIS THE SIGNIFICANCE OF ISOLATING THIS ORGANISM FROM A SINGLE SET OF BLOOD CULTURES WHEN MULTIPLE SETS ARE DRAWN IS UNCERTAIN. PLEASE NOTIFY THE MICROBIOLOGY DEPARTMENT WITHIN ONE WEEK IF SPECIATION AND SENSITIVITIES ARE REQUIRED. Performed at [facility], [place] [address] [place], [place] [address]    Report Status 10/02/2019 FINAL  Final  Blood Culture ID Panel (Reflexed)     Status: Abnormal   Collection Time: 09/28/19 11:19 PM  Result Value Ref Range Status   Enterococcus faecalis NOT DETECTED NOT DETECTED Final   Enterococcus Faecium NOT DETECTED NOT DETECTED Final   Listeria monocytogenes NOT DETECTED NOT DETECTED Final    Staphylococcus species DETECTED (A) NOT DETECTED Final    Comment: CRITICAL RESULT CALLED TO, READ BACK BY AND VERIFIED WITH: Merideth Abbey RN 09/30/19 6606 JDW    Staphylococcus aureus (BCID) NOT DETECTED NOT DETECTED Final   Staphylococcus epidermidis DETECTED (A) NOT DETECTED Final    Comment: Methicillin (oxacillin) resistant coagulase negative staphylococcus. Possible blood culture contaminant (unless isolated from more than one blood culture draw or clinical case suggests pathogenicity). No  antibiotic treatment is indicated for blood  culture contaminants. CRITICAL RESULT CALLED TO, READ BACK BY AND VERIFIED WITH: K NICHOLS RN 09/30/19 1610 JDW    Staphylococcus lugdunensis NOT DETECTED NOT DETECTED Final   Streptococcus species NOT DETECTED NOT DETECTED Final   Streptococcus agalactiae NOT DETECTED NOT DETECTED Final   Streptococcus pneumoniae NOT DETECTED NOT DETECTED Final   Streptococcus pyogenes NOT DETECTED NOT DETECTED Final   A.calcoaceticus-baumannii NOT DETECTED NOT DETECTED Final   Bacteroides fragilis NOT DETECTED NOT DETECTED Final   Enterobacterales NOT DETECTED NOT DETECTED Final   Enterobacter cloacae complex NOT DETECTED NOT DETECTED Final   Escherichia coli NOT DETECTED NOT DETECTED Final   Klebsiella aerogenes NOT DETECTED NOT DETECTED Final   Klebsiella oxytoca NOT DETECTED NOT DETECTED Final   Klebsiella pneumoniae NOT DETECTED NOT DETECTED Final   Proteus species NOT DETECTED NOT DETECTED Final   Salmonella species NOT DETECTED NOT DETECTED Final   Serratia marcescens NOT DETECTED NOT DETECTED Final   Haemophilus influenzae NOT DETECTED NOT DETECTED Final   Neisseria meningitidis NOT DETECTED NOT DETECTED Final   Pseudomonas aeruginosa NOT DETECTED NOT DETECTED Final   Stenotrophomonas maltophilia NOT DETECTED NOT DETECTED Final   Candida albicans NOT DETECTED NOT DETECTED Final   Candida auris NOT DETECTED NOT DETECTED Final   Candida glabrata NOT DETECTED NOT  DETECTED Final   Candida krusei NOT DETECTED NOT DETECTED Final   Candida parapsilosis NOT DETECTED NOT DETECTED Final   Candida tropicalis NOT DETECTED NOT DETECTED Final   Cryptococcus neoformans/gattii NOT DETECTED NOT DETECTED Final   Methicillin resistance mecA/C DETECTED (A) NOT DETECTED Final    Comment: CRITICAL RESULT CALLED TO, READ BACK BY AND VERIFIED WITH: Merideth Abbey RN 09/30/19 9604 JDW Performed at [facility], [address] [place], [place] [address]   Blood Culture (routine x 2)     Status: None   Collection Time: 09/28/19 11:34 PM   Specimen: BLOOD  Result Value Ref Range Status   Specimen Description BLOOD RIGHT ANTECUBITAL  Final   Special Requests   Final    BOTTLES DRAWN AEROBIC AND ANAEROBIC Blood Culture adequate volume   Culture   Final    NO GROWTH 5 DAYS Performed at [facility], [address], [place], [place] [address]    Report Status 10/03/2019 FINAL  Final     Scheduled Meds: . sodium chloride   Intravenous Once  . albuterol  2 puff Inhalation TID  . vitamin C  500 mg Oral Daily  . diltiazem  120 mg Oral Daily  . insulin aspart  0-20 Units Subcutaneous TID WC  . insulin aspart  0-5 Units Subcutaneous QHS  . insulin aspart  20 Units Subcutaneous TID WC  . insulin glargine  50 Units Subcutaneous QHS  . methylPREDNISolone (SOLU-MEDROL) injection  125 mg Intravenous Q12H  . metoprolol tartrate  25 mg Oral TID  . pantoprazole  40 mg Intravenous Q12H  . tamsulosin  0.4 mg Oral QPC supper  . zinc sulfate  220 mg Oral Daily   Continuous Infusions:  Procedures/Studies: DG Chest 2 View  Result Date: 09/15/2019 CLINICAL DATA:  Acute chest pain. EXAM: CHEST - 2 VIEW COMPARISON:  None. FINDINGS: The heart size and mediastinal contours are within normal limits. No pneumothorax or pleural effusion is noted. Right lung is clear. Minimal left basilar subsegmental atelectasis or scarring is noted. The visualized skeletal structures are unremarkable.  IMPRESSION: Minimal left basilar subsegmental atelectasis or scarring. Electronically Signed  By: Marijo Conception M.D.   On: 09/15/2019 16:58   DG Ribs Unilateral Right  Result Date: 09/15/2019 CLINICAL DATA:  Acute right chest pain. EXAM: RIGHT RIBS - 2 VIEW COMPARISON:  None. FINDINGS: No fracture or other bone lesions are seen involving the ribs. IMPRESSION: Negative. Electronically Signed   By: Marijo Conception M.D.   On: 09/15/2019 16:57   CT ANGIO CHEST PE W OR WO CONTRAST  Result Date: 10/03/2019 CLINICAL DATA:  COVID-19 positive, weakness, dizziness and shortness of breath EXAM: CT ANGIOGRAPHY CHEST WITH CONTRAST TECHNIQUE: Multidetector CT imaging of the chest was performed using the standard protocol during bolus administration of intravenous contrast. Multiplanar CT image reconstructions and MIPs were obtained to evaluate the vascular anatomy. CONTRAST:  122mL OMNIPAQUE IOHEXOL 350 MG/ML SOLN COMPARISON:  Radiograph 09/28/2019, CT abdomen pelvis 08/15/2018 FINDINGS: Cardiovascular: Satisfactory opacification the pulmonary arteries to the segmental level. No pulmonary artery filling defects are identified. Central pulmonary arteries are normal caliber. Suboptimal opacification of the thoracic aorta. No discernible acute abnormality of the thoracic aorta proximal great vessels. Aberrant right subclavian artery. Borderline cardiac enlargement. Slight reflux of contrast into the IVC. Mediastinum/Nodes: No mediastinal fluid or gas. Normal thyroid gland and thoracic inlet. No acute abnormality of the trachea or esophagus. No worrisome mediastinal, hilar or axillary adenopathy. Lungs/Pleura: Mixed areas of heterogeneous consolidative and ground-glass opacity throughout both lungs with a basilar and peripheral predominance. No pneumothorax or visible effusion. Diffuse airways thickening. Upper Abdomen: Indeterminate intermediate attenuation (38 HU) lesion measuring up to 8.5 cm in maximum transaxial  dimension within the right lobe liver (4/80). Cholelithiasis with a partially calcified gallstone towards the gallbladder neck. Multiple soft tissue attenuation lesions present along the lesser curvature/gastrohepatic ligament. Largest measuring up to 4.6 cm in size (4/91). Partially exophytic 0.7 cm lesion arising from the upper pole left kidney, corresponding with a previously seen fluid attenuation cyst on prior comparison CT, could reflect intracystic hemorrhage/proteinaceous debris. Musculoskeletal: Multilevel degenerative changes are present in the imaged portions of the spine. No acute osseous abnormality or suspicious osseous lesion. Probable intramuscular lipoma of the right infraspinatus measuring up to 6.9 by 3.5 cm (4/20). No other acute or worrisome chest wall lesions. Review of the MIP images confirms the above findings. IMPRESSION: 1. No evidence of acute pulmonary artery filling defects. 2. Mixed areas of heterogeneous consolidative and ground-glass opacity throughout both lungs with a basilar and peripheral predominance. Findings are compatible with multifocal pneumonia compatible with a COVID-19 etiology. 3. Indeterminate 4.8 cm lesion in the right lobe liver, incompletely characterized. Additional multiple soft tissue attenuation lesions along the lesser curvature/gastrohepatic ligament, largest measuring up to 4.6 cm in size. These findings are new from comparison abdominal CT 08/15/2018 and could raise concern for potential malignancy. Could consider further evaluation with abdominal MR with contrast. 4. Additional 7 mm hyperdense lesion arising from the upper pole left kidney, could reflect proteinaceous cysts, Ob also be better evaluated on dedicated abdominal imaging. 5. Cholelithiasis. 6. Aberrant right subclavian artery. These results will be called to the ordering clinician or representative by the Radiologist Assistant, and communication documented in the PACS or [employer].  Electronically Signed   By: Lovena Le M.D.   On: 10/03/2019 15:38   MR LIVER W WO CONTRAST  Result Date: 10/04/2019 CLINICAL DATA:  Evaluate liver lesions and upper abdominal adenopathy seen on recent chest CT. EXAM: MRI ABDOMEN WITHOUT AND WITH CONTRAST TECHNIQUE: Multiplanar multisequence MR imaging of the abdomen was performed both  before and after the administration of intravenous contrast. CONTRAST:  22mL GADAVIST GADOBUTROL 1 MMOL/ML IV SOLN COMPARISON:  CT abdomen/pelvis 08/15/2018 FINDINGS: Lower chest: The lungs demonstrate changes of COVID pneumonia as demonstrated on today's chest CT. Hepatobiliary: Numerous diffusion positive hepatic metastatic lesions are noted. Segment 4A lesion measures 3.7 cm on image 7/8. 5 cm lesion and segment 7 on image 12/8. 5 cm lesion in segment 6 on image 18/8. Several other smaller lesions are noted. No intrahepatic biliary dilatation. There is diffuse fatty infiltration of the liver noted. A 2 cm gallstone is noted in the gallbladder. No common bile duct dilatation. Pancreas:  No mass, inflammation or ductal dilatation. Spleen:  Normal size. No focal lesions. Adrenals/Urinary Tract: The adrenal glands and kidneys are unremarkable. Stomach/Bowel: There is a large infiltrating gastric mass involving the fundal region 6.5 x 4.8 cm and is diffusion positive. Moderate contrast enhancement is noted. Adjacent gastrohepatic ligament lymphadenopathy with the largest node measuring 4.6 cm. Vascular/Lymphatic: The aorta is normal in caliber. No dissection. The branch vessels are patent. No retroperitoneal lymphadenopathy. Other:  No ascites or abdominal wall hernia. Musculoskeletal: No worrisome bone lesions. IMPRESSION: 1. 6.5 x 4.8 cm infiltrating gastric mass involving the fundal region. Associated gastrohepatic ligament lymphadenopathy. 2. Numerous hepatic metastatic lesions. 3. Cholelithiasis. Electronically Signed   By: Marijo Sanes M.D.   On: 10/04/2019 06:26   DG  Chest Port 1 View  Result Date: 09/28/2019 CLINICAL DATA:  COVID EXAM: PORTABLE CHEST 1 VIEW COMPARISON:  09/24/2019 FINDINGS: Development of patchy bilateral airspace opacities. No pleural effusion. Stable cardiomediastinal silhouette. No pneumothorax. IMPRESSION: Development of patchy bilateral airspace opacities, consistent with bilateral pneumonia. Electronically Signed   By: Donavan Foil M.D.   On: 09/28/2019 23:14   DG Chest Port 1 View  Result Date: 09/24/2019 CLINICAL DATA:  COVID pneumonia, weakness, fatigue EXAM: PORTABLE CHEST 1 VIEW COMPARISON:  09/15/2019 FINDINGS: The lungs are symmetrically expanded. Minimal left basilar atelectasis is again noted. No superimposed confluent pulmonary infiltrate. No pneumothorax or pleural effusion. Cardiac size within normal limits. The pulmonary vascularity is normal. No acute bone abnormality. IMPRESSION: Minimal left basilar atelectasis, unchanged. No superimposed confluent pulmonary infiltrate. Electronically Signed   By: Fidela Salisbury MD   On: 09/24/2019 20:15   ECHOCARDIOGRAM COMPLETE  Result Date: 09/30/2019    ECHOCARDIOGRAM REPORT   Patient Name:   OBERON HEHIR Date of Exam: 09/30/2019 Medical Rec #:  [id number]         Height:       72.0 in Accession #:    [id number]        Weight:       237.0 lb Date of Birth:  27-Jul-1955        BSA:          2.290 m Patient Age:    38 years          BP:           95/60 mmHg Patient Gender: M                 HR:           126 bpm. Exam Location:  [facility] Procedure: 2D Echo Indications:    Endocarditis I38  History:        Patient has no prior history of Echocardiogram examinations.                 Risk Factors:Non-Smoker. Pneumonia due to COVID-19 virus,  Sepsis, Lactic Acidosis.  Sonographer:    Leavy Cella RDCS (AE) Referring Phys: [id number] OLADAPO ADEFESO IMPRESSIONS  1. Left ventricular ejection fraction, by estimation, is 55 to 60%. The left ventricle has normal function. The left  ventricle has no regional wall motion abnormalities. There is moderate left ventricular hypertrophy. Left ventricular diastolic parameters are indeterminate.  2. Right ventricular systolic function is normal. The right ventricular size is normal. Tricuspid regurgitation signal is inadequate for assessing PA pressure.  3. The mitral valve is grossly normal. No evidence of mitral valve regurgitation.  4. The aortic valve is tricuspid. Aortic valve regurgitation is not visualized.  5. The inferior vena cava is normal in size with <50% respiratory variability, suggesting right atrial pressure of 8 mmHg.  6. Views are somewhat limited, but no definite valvular vegetations are visualized. FINDINGS  Left Ventricle: Left ventricular ejection fraction, by estimation, is 55 to 60%. The left ventricle has normal function. The left ventricle has no regional wall motion abnormalities. The left ventricular internal cavity size was normal in size. There is  moderate left ventricular hypertrophy. Left ventricular diastolic parameters are indeterminate. Right Ventricle: The right ventricular size is normal. No increase in right ventricular wall thickness. Right ventricular systolic function is normal. Tricuspid regurgitation signal is inadequate for assessing PA pressure. Left Atrium: Left atrial size was normal in size. Right Atrium: Right atrial size was normal in size. Pericardium: There is no evidence of pericardial effusion. Mitral Valve: The mitral valve is grossly normal. No evidence of mitral valve regurgitation. Tricuspid Valve: The tricuspid valve is grossly normal. Tricuspid valve regurgitation is trivial. Aortic Valve: The aortic valve is tricuspid. Aortic valve regurgitation is not visualized. Pulmonic Valve: The pulmonic valve was grossly normal. Pulmonic valve regurgitation is trivial. Aorta: The aortic root is normal in size and structure. Venous: The inferior vena cava is normal in size with less than 50%  respiratory variability, suggesting right atrial pressure of 8 mmHg. IAS/Shunts: No atrial level shunt detected by color flow Doppler.  LEFT VENTRICLE PLAX 2D LVIDd:         4.37 cm Diastology LVIDs:         2.20 cm LV e' medial:    7.18 cm/s LV PW:         1.72 cm LV E/e' medial:  9.2 LV IVS:        1.46 cm LV e' lateral:   12.90 cm/s                        LV E/e' lateral: 5.1  RIGHT VENTRICLE RV S prime:     16.00 cm/s TAPSE (M-mode): 1.5 cm LEFT ATRIUM             Index       RIGHT ATRIUM           Index LA diam:        3.70 cm 1.62 cm/m  RA Area:     15.40 cm LA Vol (A2C):   63.9 ml 27.90 ml/m RA Volume:   39.80 ml  17.38 ml/m LA Vol (A4C):   40.6 ml 17.73 ml/m LA Biplane Vol: 52.4 ml 22.88 ml/m   AORTA Ao Root diam: 3.50 cm MITRAL VALVE MV Area (PHT): 6.60 cm MV Decel Time: 115 msec MV E velocity: 66.00 cm/s MV A velocity: 39.80 cm/s MV E/A ratio:  1.66 Rozann Lesches MD Electronically signed by Rozann Lesches MD Signature Date/Time: 09/30/2019/4:58:16 PM  Final     Kathie Dike, MD  [facility]  If 7PM-7AM, please contact night-coverage [email or web address]  10/06/2019, 6:27 PM   LOS: 8 days

## 2019-10-06 NOTE — Plan of Care (Signed)
  Problem: Education: Goal: Knowledge of General Education information will improve Description: Including pain rating scale, medication(s)/side effects and non-pharmacologic comfort measures Outcome: Progressing   Problem: Health Behavior/Discharge Planning: Goal: Ability to manage health-related needs will improve Outcome: Progressing   Problem: Clinical Measurements: Goal: Ability to maintain clinical measurements within normal limits will improve Outcome: Progressing Goal: Will remain free from infection Outcome: Progressing Goal: Diagnostic test results will improve Outcome: Progressing Goal: Respiratory complications will improve Outcome: Progressing Goal: Cardiovascular complication will be avoided Outcome: Progressing   Problem: Activity: Goal: Risk for activity intolerance will decrease Outcome: Progressing   Problem: Nutrition: Goal: Adequate nutrition will be maintained Outcome: Progressing   Problem: Coping: Goal: Level of anxiety will decrease Outcome: Progressing   Problem: Elimination: Goal: Will not experience complications related to bowel motility Outcome: Progressing Goal: Will not experience complications related to urinary retention Outcome: Progressing   Problem: Pain Managment: Goal: General experience of comfort will improve Outcome: Progressing   Problem: Safety: Goal: Ability to remain free from injury will improve Outcome: Progressing   Problem: Skin Integrity: Goal: Risk for impaired skin integrity will decrease Outcome: Progressing   Problem: Education: Goal: Knowledge of risk factors and measures for prevention of condition will improve Outcome: Progressing   Problem: Coping: Goal: Psychosocial and spiritual needs will be supported Outcome: Progressing   Problem: Respiratory: Goal: Will maintain a patent airway Outcome: Progressing Goal: Complications related to the disease process, condition or treatment will be avoided or  minimized Outcome: Progressing   Problem: Respiratory: Goal: Will maintain a patent airway Outcome: Progressing Goal: Complications related to the disease process, condition or treatment will be avoided or minimized Outcome: Progressing

## 2019-10-06 NOTE — TOC Progression Note (Signed)
Transition of Care [facility]) - Progression Note    Patient Details  Name: Stephen Wilcox MRN: [id number] Date of Birth: May 23, 1955  [facility]) CM/SW Contact  Ihor Gully, LCSW Phone Number:  10/06/2019, 4:25 PM  Clinical Narrative:    Email to Jenny Reichmann with [facility] advising that no bed offers had been made from in network facility. Included Nile Riggs with [facility] in email as Jenny Reichmann advised that a prior auth form would need to be completed by Juliann Pulse for consideration of single case agreement.    Expected Discharge Plan: [place] Barriers to Discharge: Continued Medical Work up  Expected Discharge Plan and Services Expected Discharge Plan: [place] In-house Referral: Clinical Social Work   Post Acute Care Choice: [place] Living arrangements for the past 2 months: Apartment                                       Social Determinants of Health (SDOH) Interventions    Readmission Risk Interventions No flowsheet data found.

## 2019-10-07 LAB — COMPREHENSIVE METABOLIC PANEL
ALT: 35 U/L (ref 0–44)
AST: 26 U/L (ref 15–41)
Albumin: 2.3 g/dL — ABNORMAL LOW (ref 3.5–5.0)
Alkaline Phosphatase: 52 U/L (ref 38–126)
Anion gap: 8 (ref 5–15)
BUN: 30 mg/dL — ABNORMAL HIGH (ref 8–23)
CO2: 28 mmol/L (ref 22–32)
Calcium: 7.5 mg/dL — ABNORMAL LOW (ref 8.9–10.3)
Chloride: 101 mmol/L (ref 98–111)
Creatinine, Ser: 0.73 mg/dL (ref 0.61–1.24)
GFR calc Af Amer: >60 mL/min (ref >60)
GFR calc non Af Amer: >60 mL/min (ref >60)
Glucose, Bld: 110 mg/dL — ABNORMAL HIGH (ref 70–99)
Potassium: 4.4 mmol/L (ref 3.5–5.1)
Sodium: 137 mmol/L (ref 135–145)
Total Bilirubin: 0.7 mg/dL (ref 0.3–1.2)
Total Protein: 5 g/dL — ABNORMAL LOW (ref 6.5–8.1)

## 2019-10-07 LAB — CBC WITH DIFFERENTIAL/PLATELET
Abs Immature Granulocytes: 0.27 10*3/uL — ABNORMAL HIGH (ref 0.00–0.07)
Basophils Absolute: 0 10*3/uL (ref 0.0–0.1)
Basophils Relative: 0 %
Eosinophils Absolute: 0 10*3/uL (ref 0.0–0.5)
Eosinophils Relative: 0 %
HCT: 30.6 % — ABNORMAL LOW (ref 39.0–52.0)
Hemoglobin: 8.7 g/dL — ABNORMAL LOW (ref 13.0–17.0)
Immature Granulocytes: 2 %
Lymphocytes Relative: 3 %
Lymphs Abs: 0.4 10*3/uL — ABNORMAL LOW (ref 0.7–4.0)
MCH: 23 pg — ABNORMAL LOW (ref 26.0–34.0)
MCHC: 28.4 g/dL — ABNORMAL LOW (ref 30.0–36.0)
MCV: 80.7 fL (ref 80.0–100.0)
Monocytes Absolute: 0.7 10*3/uL (ref 0.1–1.0)
Monocytes Relative: 5 %
Neutro Abs: 13.8 10*3/uL — ABNORMAL HIGH (ref 1.7–7.7)
Neutrophils Relative %: 90 %
Platelets: 311 10*3/uL (ref 150–400)
RBC: 3.79 MIL/uL — ABNORMAL LOW (ref 4.22–5.81)
RDW: 19.4 % — ABNORMAL HIGH (ref 11.5–15.5)
WBC: 15.2 10*3/uL — ABNORMAL HIGH (ref 4.0–10.5)
nRBC: 0.2 % (ref 0.0–0.2)

## 2019-10-07 LAB — D-DIMER, QUANTITATIVE: D-Dimer, Quant: 1.81 ug/mL-FEU — ABNORMAL HIGH (ref 0.00–0.50)

## 2019-10-07 LAB — GLUCOSE, CAPILLARY
Glucose-Capillary: 107 mg/dL — ABNORMAL HIGH (ref 70–99)
Glucose-Capillary: 207 mg/dL — ABNORMAL HIGH (ref 70–99)
Glucose-Capillary: 242 mg/dL — ABNORMAL HIGH (ref 70–99)
Glucose-Capillary: 279 mg/dL — ABNORMAL HIGH (ref 70–99)

## 2019-10-07 LAB — C-REACTIVE PROTEIN: CRP: 0.6 mg/dL (ref <1.0)

## 2019-10-07 LAB — FERRITIN: Ferritin: 25 ng/mL (ref 24–336)

## 2019-10-07 MED ORDER — PANTOPRAZOLE SODIUM 40 MG PO TBEC
40.0000 mg | DELAYED_RELEASE_TABLET | Freq: Two times a day (BID) | ORAL | Status: DC
  Administered 2019-10-07 – 2019-10-25 (×36): 40 mg via ORAL
  Filled 2019-10-07 (×36): qty 1

## 2019-10-07 NOTE — TOC Progression Note (Addendum)
Transition of Care [facility]) - Progression Note    Patient Details  Name: Stephen Wilcox MRN: [id number] Date of Birth: [birth year]  [facility]) CM/SW Contact  Ihor Gully, LCSW Phone Number: 10/07/2019, 3:42 PM  Clinical Narrative:    Stephen Wilcox is considering extending a bed offer if a single place agreement can be worked out with [facility]. If agreement is worked out, facility can admit Tuesday.  Spoke with daughter, Stephen Wilcox, provided update on difficulty with securing placement. Discussed possibility of hospital at home program. Daughter reports that she lives in [place] and patient's son lives in [place]. Patient's home has "stuff everywhere" per daughter's report. Patient's spouse is hospitalized with COVID at a different campus currently.   Expected Discharge Plan: [place] Barriers to Discharge: Continued Medical Work up  Expected Discharge Plan and Services Expected Discharge Plan: [place] In-house Referral: Clinical Social Work   Post Acute Care Choice: [place] Living arrangements for the past 2 months: Apartment                                       Social Determinants of Health (SDOH) Interventions    Readmission Risk Interventions No flowsheet data found.

## 2019-10-07 NOTE — Progress Notes (Signed)
PROGRESS NOTE  JAWANZA ZAMBITO FAO:[id number] DOB: Dec 04, 1955 DOA: 09/28/2019 PCP: Jake Samples, PA-C  Brief History:  64 year old male without any documented significant chronic medical problems presenting to the ED on 09/24/2019 with lightheadedness malaise, and some generalized weakness.  Patient was diagnosed with positive Covid.  He had declined monoclonal antibody infusion.  On 09/28/2019, the patient presented back to the emergency department with worsening generalized weakness, dizziness, and cough and malaise.  He was noted to have oxygen saturation of 82% room air and placed on 2 L nasal cannula.  Was subsequently started on remdesivir and IV steroids.  At the time of admission, the patient was noted to have a hemoglobin of 7.3 which was a significant drop when compared to 09/24/2019 when his hemoglobin was 9.1.  Hemoccult was noted to be positive during admission.  GI was consulted to assist with management.  Dr. Laural Golden saw the patient.  He did not feel that the patient had any overt active GI bleeding.  He recommended PPI infusion for 72 hours and subsequently transitioned to oral Protonix.  He recommended endoscopic evaluation if the patient's hemoglobin continued to drop again requiring transfusion.  The patient was transfused 2 units PRBC.  His hemoglobin went up to 9.3 and has remained stable since then.  The patient's oxygen demand gradually increased to 6 L, but subsequently remained stable.  Blood cultures grew Staph epidermidis in 1 of 2 sets.  This was thought to be a contaminant.  Empiric antibiotics were discontinued.  On 09/29/2019, the patient was noted to have atrial fibrillation.  EKG confirmed atrial fibrillation.  The patient was started on oral diltiazem.  Echocardiogram on 09/30/2019 showed EF 55 to 60%, no WMA.  Assessment/Plan: Acute respiratory failure with hypoxia secondary to COVID-19 pneumonia -sepsis ruled out -Patient was weaned off of oxygen and is  currently on 2L -Oxygen saturation 90-92% -Continue steroids, day 8.   -Continue vitamin C and zinc -CRP 19.4>> 15.8>> 7.9>> 3.6>> 1.7>> 1.0>>0.8 -Ferritin 59>> 70>> 95>> 90>> 67>> 53>>50 -D-dimer 1.89>> 1.48>> 1.26>> 1.13>> 1.31>> 1.48>>1.71 -finished 5 days remdesivir 10/03/19 -10/03/19 CTA chest--no PE; 4.8 cm lesion in the right lobe liver, incompletely characterized. Additional multiple soft tissue attenuation lesions along the lesser curvature/gastrohepatic ligament, largest measuring up to 4.6 cm in size  Gastric and Hepatic Masses -incidental finding on CTA chest -10/03/19 MR Liver--6.5 x 4.8 cm infiltrating gastric mass involving the fundus and numerous liver masses -GI consult--> -may need liver bx -patient and daughter updated  Symptomatic anemia/heme positive stool -Appreciate GI consult -Dr. Laural Golden followed the patient--> recommended IV Protonix then transition to p.o. Protonix -Will ultimately need endoscopy, likely to be performed as an outpatient -More urgent endoscopy if the patient's hemoglobin drops again -Transfused 2 units PRBC -Hemoglobin has been stable. -in retrospect, gastric mass may have been source of bleed  New onset atrial fibrillation -Currently rate controlled -Transition to long-acting diltiazem-->increase to 180 mg -Continue metoprolol -09/30/2019 echo EF 55 to 60%, no WMA -Start aspirin 81 mg daily -CHA2DS2-VASc = 1 -TSH--0.733 -hold ASA until evaluated by GI for possible endoscopy  Diabetes mellitus type 2 -Elevated CBGs secondary to steroids -09/28/2019 hemoglobin A1c 6.9 -Continue Lantus at 50 units -Resistant NovoLog sliding scale -Increase premeal insulin to 20 units  Microcytic anemia/Iron deficiency -iron saturation 3% -ferritin low despite COVID-19 -iron supplement when recovered from COVID-19  Thrombocytosis -due to iron deficiency and acute medical illness  Class II obesity -  BMI 32.14 -Lifestyle  modification  BPH -Continue tamsulosin  Bacteremia -Staph epidermidis 1 out of 2 sets -Represents contaminant  Generalized weakness -PT evaluation--> skilled nursing facility with which the patient and daughter agree      Status is: Inpatient  Remains inpatient appropriate because:IV treatments appropriate due to intensity of illness or inability to take PO   Dispo: The patient is from: Home  Anticipated d/c is to: SNF  Anticipated d/c date is: 1 day  Patient currently is medically stable to d/c.        Family Communication:   Daughter updated 9/16  Consultants:  GI--Rehman  Code Status:  FULL   DVT Prophylaxis:  SCDs   Procedures: As Listed in Progress Note Above  Antibiotics: None     Subjective: He is frustrated by having to stay in the hospital.  Denies any shortness of breath.  Objective: Vitals:   10/07/19 0033 10/07/19 0518 10/07/19 0802 10/07/19 1300  BP:  96/69  104/77  Pulse: 99 92  97  Resp:  19  18  Temp:  98.1 F (36.7 C)  97.9 F (36.6 C)  TempSrc:    Oral  SpO2: (!) 88% (!) 87% (!) 88% 93%  Weight:      Height:        Intake/Output Summary (Last 24 hours) at 10/07/2019 1830 Last data filed at 10/07/2019 0900 Gross per 24 hour  Intake 240 ml  Output 400 ml  Net -160 ml   Weight change:  Exam:  General exam: Alert, awake, oriented x 3 Respiratory system: Clear to auscultation. Respiratory effort normal. Cardiovascular system:RRR. No murmurs, rubs, gallops. Gastrointestinal system: Abdomen is nondistended, soft and nontender. No organomegaly or masses felt. Normal bowel sounds heard. Central nervous system: Alert and oriented. No focal neurological deficits. Extremities: No C/C/E, +pedal pulses Skin: No rashes, lesions or ulcers Psychiatry: Judgement and insight appear normal. Mood & affect appropriate.    Data Reviewed: I have personally reviewed following  labs and imaging studies Basic Metabolic Panel: Recent Labs  Lab 10/01/19 0714 10/01/19 0714 10/02/19 0742 10/02/19 0742 10/03/19 0730 10/04/19 0650 10/05/19 0629 10/06/19 0716 10/07/19 0845  NA 135   < > 133*   < > 134* 136 137 135 137  K 4.4   < > 4.4   < > 4.3 4.3 4.5 4.4 4.4  CL 93*   < > 93*   < > 94* 97* 97* 98 101  CO2 30   < > 30   < > 29 31 31  32 28  GLUCOSE 375*   < > 367*   < > 404* 234* 204* 186* 110*  BUN 25*   < > 31*   < > 41* 36* 35* 33* 30*  CREATININE 0.83   < > 0.91   < > 0.93 0.86 0.85 0.85 0.73  CALCIUM 8.3*   < > 8.2*   < > 8.4* 8.1* 7.9* 7.7* 7.5*  MG 2.4  --  2.5*  --  2.6*  --   --   --   --   PHOS 2.5  --  3.3  --  3.4  --   --   --   --    < > = values in this interval not displayed.   Liver Function Tests: Recent Labs  Lab 10/03/19 0730 10/04/19 0650 10/05/19 0629 10/06/19 0716 10/07/19 0845  AST 34 36 28 29 26   ALT 43 42 38 36 35  ALKPHOS 50 46  49 49 52  BILITOT 0.8 0.6 0.8 0.6 0.7  PROT 5.7* 5.4* 5.4* 5.1* 5.0*  ALBUMIN 2.5* 2.4* 2.4* 2.2* 2.3*   No results for input(s): LIPASE, AMYLASE in the last 168 hours. No results for input(s): AMMONIA in the last 168 hours. Coagulation Profile: No results for input(s): INR, PROTIME in the last 168 hours. CBC: Recent Labs  Lab 10/03/19 0730 10/04/19 0650 10/05/19 0629 10/06/19 0716 10/07/19 0845  WBC 20.5* 18.1* 13.6* 13.0* 15.2*  NEUTROABS 18.9* 16.8* 12.7* 11.9* 13.8*  HGB 9.9* 9.5* 8.7* 8.4* 8.7*  HCT 33.0* 31.8* 30.0* 28.4* 30.6*  MCV 78.8* 78.5* 78.5* 78.7* 80.7  PLT 514* 463* 377 329 311   Cardiac Enzymes: No results for input(s): CKTOTAL, CKMB, CKMBINDEX, TROPONINI in the last 168 hours. BNP: Invalid input(s): POCBNP CBG: Recent Labs  Lab 10/06/19 1619 10/06/19 2114 10/07/19 0748 10/07/19 1142 10/07/19 1708  GLUCAP 241* 262* 107* 207* 242*   HbA1C: No results for input(s): HGBA1C in the last 72 hours. Urine analysis:    Component Value Date/Time   COLORURINE  YELLOW 08/15/2018 1009   APPEARANCEUR CLEAR 08/15/2018 1009   LABSPEC 1.013 08/15/2018 1009   PHURINE 6.0 08/15/2018 1009   GLUCOSEU NEGATIVE 08/15/2018 1009   HGBUR MODERATE (A) 08/15/2018 1009   BILIRUBINUR NEGATIVE 08/15/2018 1009   [place] 08/15/2018 1009   PROTEINUR NEGATIVE 08/15/2018 1009   NITRITE NEGATIVE 08/15/2018 1009   LEUKOCYTESUR NEGATIVE 08/15/2018 1009   Sepsis Labs: @LABRCNTIP (procalcitonin:4,lacticidven:4) ) Recent Results (from the past 240 hour(s))  Blood Culture (routine x 2)     Status: Abnormal   Collection Time: 09/28/19 11:19 PM   Specimen: BLOOD  Result Value Ref Range Status   Specimen Description   Final    BLOOD LEFT ANTECUBITAL Performed at [facility], [address] [place], [place] [address]    Special Requests   Final    BOTTLES DRAWN AEROBIC AND ANAEROBIC Blood Culture adequate volume Performed at [facility], [address] [place], [place] [address]    Culture  Setup Time   Final    ANAEROBIC BOTTLE ONLY GRAM POSITIVE COCCI Gram Stain Report Called to,Read Back By and Verified With: T EASTER,RN@[id number]  09/29/19 MKELLY CRITICAL RESULT CALLED TO, READ BACK BY AND VERIFIED WITH: K NICHOLS RN 09/30/19 0611 JDW    Culture (A)  Final    STAPHYLOCOCCUS EPIDERMIDIS THE SIGNIFICANCE OF ISOLATING THIS ORGANISM FROM A SINGLE SET OF BLOOD CULTURES WHEN MULTIPLE SETS ARE DRAWN IS UNCERTAIN. PLEASE NOTIFY THE MICROBIOLOGY DEPARTMENT WITHIN ONE WEEK IF SPECIATION AND SENSITIVITIES ARE REQUIRED. Performed at [facility], [address] [place], [place] [address]    Report Status 10/02/2019 FINAL  Final  Blood Culture ID Panel (Reflexed)     Status: Abnormal   Collection Time: 09/28/19 11:19 PM  Result Value Ref Range Status   Enterococcus faecalis NOT DETECTED NOT DETECTED Final   Enterococcus Faecium NOT DETECTED NOT DETECTED Final   Listeria monocytogenes NOT DETECTED NOT DETECTED Final   Staphylococcus species DETECTED (A) NOT  DETECTED Final    Comment: CRITICAL RESULT CALLED TO, READ BACK BY AND VERIFIED WITH: Merideth Abbey RN 09/30/19 0175 JDW    Staphylococcus aureus (BCID) NOT DETECTED NOT DETECTED Final   Staphylococcus epidermidis DETECTED (A) NOT DETECTED Final    Comment: Methicillin (oxacillin) resistant coagulase negative staphylococcus. Possible blood culture contaminant (unless isolated from more than one blood culture draw or clinical case suggests pathogenicity). No antibiotic treatment is indicated for blood  culture contaminants. CRITICAL RESULT CALLED TO, READ BACK BY AND VERIFIED WITH: K NICHOLS RN 09/30/19 3716 JDW    Staphylococcus lugdunensis NOT DETECTED NOT DETECTED Final   Streptococcus species NOT DETECTED NOT DETECTED Final   Streptococcus agalactiae NOT DETECTED NOT DETECTED Final   Streptococcus pneumoniae NOT DETECTED NOT DETECTED Final   Streptococcus pyogenes NOT DETECTED NOT DETECTED Final   A.calcoaceticus-baumannii NOT DETECTED NOT DETECTED Final   Bacteroides fragilis NOT DETECTED NOT DETECTED Final   Enterobacterales NOT DETECTED NOT DETECTED Final   Enterobacter cloacae complex NOT DETECTED NOT DETECTED Final   Escherichia coli NOT DETECTED NOT DETECTED Final   Klebsiella aerogenes NOT DETECTED NOT DETECTED Final   Klebsiella oxytoca NOT DETECTED NOT DETECTED Final   Klebsiella pneumoniae NOT DETECTED NOT DETECTED Final   Proteus species NOT DETECTED NOT DETECTED Final   Salmonella species NOT DETECTED NOT DETECTED Final   Serratia marcescens NOT DETECTED NOT DETECTED Final   Haemophilus influenzae NOT DETECTED NOT DETECTED Final   Neisseria meningitidis NOT DETECTED NOT DETECTED Final   Pseudomonas aeruginosa NOT DETECTED NOT DETECTED Final   Stenotrophomonas maltophilia NOT DETECTED NOT DETECTED Final   Candida albicans NOT DETECTED NOT DETECTED Final   Candida auris NOT DETECTED NOT DETECTED Final   Candida glabrata NOT DETECTED NOT DETECTED Final   Candida krusei NOT  DETECTED NOT DETECTED Final   Candida parapsilosis NOT DETECTED NOT DETECTED Final   Candida tropicalis NOT DETECTED NOT DETECTED Final   Cryptococcus neoformans/gattii NOT DETECTED NOT DETECTED Final   Methicillin resistance mecA/C DETECTED (A) NOT DETECTED Final    Comment: CRITICAL RESULT CALLED TO, READ BACK BY AND VERIFIED WITH: Merideth Abbey RN 09/30/19 9678 JDW Performed at [facility], [address] [place], [place] [address]   Blood Culture (routine x 2)     Status: None   Collection Time: 09/28/19 11:34 PM   Specimen: BLOOD  Result Value Ref Range Status   Specimen Description BLOOD RIGHT ANTECUBITAL  Final   Special Requests   Final    BOTTLES DRAWN AEROBIC AND ANAEROBIC Blood Culture adequate volume   Culture   Final    NO GROWTH 5 DAYS Performed at [facility], [address], [place], [place] [address]    Report Status 10/03/2019 FINAL  Final     Scheduled Meds: . sodium chloride   Intravenous Once  . albuterol  2 puff Inhalation TID  . vitamin C  500 mg Oral Daily  . diltiazem  120 mg Oral Daily  . insulin aspart  0-20 Units Subcutaneous TID WC  . insulin aspart  0-5 Units Subcutaneous QHS  . insulin glargine  50 Units Subcutaneous QHS  . metoprolol tartrate  25 mg Oral TID  . pantoprazole  40 mg Oral BID AC  . predniSONE  40 mg Oral Q breakfast  . tamsulosin  0.4 mg Oral QPC supper  . zinc sulfate  220 mg Oral Daily   Continuous Infusions:  Procedures/Studies: DG Chest 2 View  Result Date: 09/15/2019 CLINICAL DATA:  Acute chest pain. EXAM: CHEST - 2 VIEW COMPARISON:  None. FINDINGS: The heart size and mediastinal contours are within normal limits. No pneumothorax or pleural effusion is noted. Right lung is clear. Minimal left basilar subsegmental atelectasis or scarring is noted. The visualized skeletal structures are unremarkable. IMPRESSION: Minimal left basilar subsegmental atelectasis or scarring. Electronically Signed   By: Marijo Conception M.D.   On:  09/15/2019 16:58   DG Ribs  Unilateral Right  Result Date: 09/15/2019 CLINICAL DATA:  Acute right chest pain. EXAM: RIGHT RIBS - 2 VIEW COMPARISON:  None. FINDINGS: No fracture or other bone lesions are seen involving the ribs. IMPRESSION: Negative. Electronically Signed   By: Marijo Conception M.D.   On: 09/15/2019 16:57   CT ANGIO CHEST PE W OR WO CONTRAST  Result Date: 10/03/2019 CLINICAL DATA:  COVID-19 positive, weakness, dizziness and shortness of breath EXAM: CT ANGIOGRAPHY CHEST WITH CONTRAST TECHNIQUE: Multidetector CT imaging of the chest was performed using the standard protocol during bolus administration of intravenous contrast. Multiplanar CT image reconstructions and MIPs were obtained to evaluate the vascular anatomy. CONTRAST:  183mL OMNIPAQUE IOHEXOL 350 MG/ML SOLN COMPARISON:  Radiograph 09/28/2019, CT abdomen pelvis 08/15/2018 FINDINGS: Cardiovascular: Satisfactory opacification the pulmonary arteries to the segmental level. No pulmonary artery filling defects are identified. Central pulmonary arteries are normal caliber. Suboptimal opacification of the thoracic aorta. No discernible acute abnormality of the thoracic aorta proximal great vessels. Aberrant right subclavian artery. Borderline cardiac enlargement. Slight reflux of contrast into the IVC. Mediastinum/Nodes: No mediastinal fluid or gas. Normal thyroid gland and thoracic inlet. No acute abnormality of the trachea or esophagus. No worrisome mediastinal, hilar or axillary adenopathy. Lungs/Pleura: Mixed areas of heterogeneous consolidative and ground-glass opacity throughout both lungs with a basilar and peripheral predominance. No pneumothorax or visible effusion. Diffuse airways thickening. Upper Abdomen: Indeterminate intermediate attenuation (38 HU) lesion measuring up to 8.5 cm in maximum transaxial dimension within the right lobe liver (4/80). Cholelithiasis with a partially calcified gallstone towards the gallbladder neck.  Multiple soft tissue attenuation lesions present along the lesser curvature/gastrohepatic ligament. Largest measuring up to 4.6 cm in size (4/91). Partially exophytic 0.7 cm lesion arising from the upper pole left kidney, corresponding with a previously seen fluid attenuation cyst on prior comparison CT, could reflect intracystic hemorrhage/proteinaceous debris. Musculoskeletal: Multilevel degenerative changes are present in the imaged portions of the spine. No acute osseous abnormality or suspicious osseous lesion. Probable intramuscular lipoma of the right infraspinatus measuring up to 6.9 by 3.5 cm (4/20). No other acute or worrisome chest wall lesions. Review of the MIP images confirms the above findings. IMPRESSION: 1. No evidence of acute pulmonary artery filling defects. 2. Mixed areas of heterogeneous consolidative and ground-glass opacity throughout both lungs with a basilar and peripheral predominance. Findings are compatible with multifocal pneumonia compatible with a COVID-19 etiology. 3. Indeterminate 4.8 cm lesion in the right lobe liver, incompletely characterized. Additional multiple soft tissue attenuation lesions along the lesser curvature/gastrohepatic ligament, largest measuring up to 4.6 cm in size. These findings are new from comparison abdominal CT 08/15/2018 and could raise concern for potential malignancy. Could consider further evaluation with abdominal MR with contrast. 4. Additional 7 mm hyperdense lesion arising from the upper pole left kidney, could reflect proteinaceous cysts, Ob also be better evaluated on dedicated abdominal imaging. 5. Cholelithiasis. 6. Aberrant right subclavian artery. These results will be called to the ordering clinician or representative by the Radiologist Assistant, and communication documented in the PACS or [employer]. Electronically Signed   By: Lovena Le M.D.   On: 10/03/2019 15:38   MR LIVER W WO CONTRAST  Result Date: 10/04/2019 CLINICAL  DATA:  Evaluate liver lesions and upper abdominal adenopathy seen on recent chest CT. EXAM: MRI ABDOMEN WITHOUT AND WITH CONTRAST TECHNIQUE: Multiplanar multisequence MR imaging of the abdomen was performed both before and after the administration of intravenous contrast. CONTRAST:  20mL GADAVIST GADOBUTROL 1 MMOL/ML  IV SOLN COMPARISON:  CT abdomen/pelvis 08/15/2018 FINDINGS: Lower chest: The lungs demonstrate changes of COVID pneumonia as demonstrated on today's chest CT. Hepatobiliary: Numerous diffusion positive hepatic metastatic lesions are noted. Segment 4A lesion measures 3.7 cm on image 7/8. 5 cm lesion and segment 7 on image 12/8. 5 cm lesion in segment 6 on image 18/8. Several other smaller lesions are noted. No intrahepatic biliary dilatation. There is diffuse fatty infiltration of the liver noted. A 2 cm gallstone is noted in the gallbladder. No common bile duct dilatation. Pancreas:  No mass, inflammation or ductal dilatation. Spleen:  Normal size. No focal lesions. Adrenals/Urinary Tract: The adrenal glands and kidneys are unremarkable. Stomach/Bowel: There is a large infiltrating gastric mass involving the fundal region 6.5 x 4.8 cm and is diffusion positive. Moderate contrast enhancement is noted. Adjacent gastrohepatic ligament lymphadenopathy with the largest node measuring 4.6 cm. Vascular/Lymphatic: The aorta is normal in caliber. No dissection. The branch vessels are patent. No retroperitoneal lymphadenopathy. Other:  No ascites or abdominal wall hernia. Musculoskeletal: No worrisome bone lesions. IMPRESSION: 1. 6.5 x 4.8 cm infiltrating gastric mass involving the fundal region. Associated gastrohepatic ligament lymphadenopathy. 2. Numerous hepatic metastatic lesions. 3. Cholelithiasis. Electronically Signed   By: Marijo Sanes M.D.   On: 10/04/2019 06:26   DG Chest Port 1 View  Result Date: 09/28/2019 CLINICAL DATA:  COVID EXAM: PORTABLE CHEST 1 VIEW COMPARISON:  09/24/2019 FINDINGS:  Development of patchy bilateral airspace opacities. No pleural effusion. Stable cardiomediastinal silhouette. No pneumothorax. IMPRESSION: Development of patchy bilateral airspace opacities, consistent with bilateral pneumonia. Electronically Signed   By: Donavan Foil M.D.   On: 09/28/2019 23:14   DG Chest Port 1 View  Result Date: 09/24/2019 CLINICAL DATA:  COVID pneumonia, weakness, fatigue EXAM: PORTABLE CHEST 1 VIEW COMPARISON:  09/15/2019 FINDINGS: The lungs are symmetrically expanded. Minimal left basilar atelectasis is again noted. No superimposed confluent pulmonary infiltrate. No pneumothorax or pleural effusion. Cardiac size within normal limits. The pulmonary vascularity is normal. No acute bone abnormality. IMPRESSION: Minimal left basilar atelectasis, unchanged. No superimposed confluent pulmonary infiltrate. Electronically Signed   By: Fidela Salisbury MD   On: 09/24/2019 20:15   ECHOCARDIOGRAM COMPLETE  Result Date: 09/30/2019    ECHOCARDIOGRAM REPORT   Patient Name:   Stephen Wilcox Date of Exam: 09/30/2019 Medical Rec #:  [id number]         Height:       72.0 in Accession #:    [id number]        Weight:       237.0 lb Date of Birth:  1955/07/15        BSA:          2.290 m Patient Age:    34 years          BP:           95/60 mmHg Patient Gender: M                 HR:           126 bpm. Exam Location:  [facility] Procedure: 2D Echo Indications:    Endocarditis I38  History:        Patient has no prior history of Echocardiogram examinations.                 Risk Factors:Non-Smoker. Pneumonia due to COVID-19 virus,                 Sepsis, Lactic Acidosis.  Sonographer:    Leavy Cella RDCS (AE) Referring Phys: [id number] OLADAPO ADEFESO IMPRESSIONS  1. Left ventricular ejection fraction, by estimation, is 55 to 60%. The left ventricle has normal function. The left ventricle has no regional wall motion abnormalities. There is moderate left ventricular hypertrophy. Left ventricular diastolic  parameters are indeterminate.  2. Right ventricular systolic function is normal. The right ventricular size is normal. Tricuspid regurgitation signal is inadequate for assessing PA pressure.  3. The mitral valve is grossly normal. No evidence of mitral valve regurgitation.  4. The aortic valve is tricuspid. Aortic valve regurgitation is not visualized.  5. The inferior vena cava is normal in size with <50% respiratory variability, suggesting right atrial pressure of 8 mmHg.  6. Views are somewhat limited, but no definite valvular vegetations are visualized. FINDINGS  Left Ventricle: Left ventricular ejection fraction, by estimation, is 55 to 60%. The left ventricle has normal function. The left ventricle has no regional wall motion abnormalities. The left ventricular internal cavity size was normal in size. There is  moderate left ventricular hypertrophy. Left ventricular diastolic parameters are indeterminate. Right Ventricle: The right ventricular size is normal. No increase in right ventricular wall thickness. Right ventricular systolic function is normal. Tricuspid regurgitation signal is inadequate for assessing PA pressure. Left Atrium: Left atrial size was normal in size. Right Atrium: Right atrial size was normal in size. Pericardium: There is no evidence of pericardial effusion. Mitral Valve: The mitral valve is grossly normal. No evidence of mitral valve regurgitation. Tricuspid Valve: The tricuspid valve is grossly normal. Tricuspid valve regurgitation is trivial. Aortic Valve: The aortic valve is tricuspid. Aortic valve regurgitation is not visualized. Pulmonic Valve: The pulmonic valve was grossly normal. Pulmonic valve regurgitation is trivial. Aorta: The aortic root is normal in size and structure. Venous: The inferior vena cava is normal in size with less than 50% respiratory variability, suggesting right atrial pressure of 8 mmHg. IAS/Shunts: No atrial level shunt detected by color flow Doppler.   LEFT VENTRICLE PLAX 2D LVIDd:         4.37 cm Diastology LVIDs:         2.20 cm LV e' medial:    7.18 cm/s LV PW:         1.72 cm LV E/e' medial:  9.2 LV IVS:        1.46 cm LV e' lateral:   12.90 cm/s                        LV E/e' lateral: 5.1  RIGHT VENTRICLE RV S prime:     16.00 cm/s TAPSE (M-mode): 1.5 cm LEFT ATRIUM             Index       RIGHT ATRIUM           Index LA diam:        3.70 cm 1.62 cm/m  RA Area:     15.40 cm LA Vol (A2C):   63.9 ml 27.90 ml/m RA Volume:   39.80 ml  17.38 ml/m LA Vol (A4C):   40.6 ml 17.73 ml/m LA Biplane Vol: 52.4 ml 22.88 ml/m   AORTA Ao Root diam: 3.50 cm MITRAL VALVE MV Area (PHT): 6.60 cm MV Decel Time: 115 msec MV E velocity: 66.00 cm/s MV A velocity: 39.80 cm/s MV E/A ratio:  1.66 Rozann Lesches MD Electronically signed by Rozann Lesches MD Signature Date/Time: 09/30/2019/4:58:16 PM    Final  Kathie Dike, MD  [facility]  If 7PM-7AM, please contact night-coverage [email or web address]  10/07/2019, 6:30 PM   LOS: 9 days

## 2019-10-07 NOTE — Progress Notes (Signed)
Physical Therapy Treatment Patient Details Name: Stephen Wilcox MRN: [id number] DOB: 04-24-1955 Today's Date: 10/07/2019    History of Present Illness Stephen Wilcox is a 64 y.o. male with no pertinent medical history who presents to the emergency department due to increased weakness in the setting of COVID-19 virus infection.  Patient initially presented to the ED on 09/24/2019 due to 4-day.history of low energy, lightheadedness and fatigue, with no cough or shortness of breath.  Wife was hospitalized with Covid at that time.  Patient appeared to have declined use of monoclonal antibody infusion due to mild to moderate Covid symptoms he had at that time per medical record. He now presents to the emergency department with worsening weakness, dizziness, cough (nonproductive) and decreased energy.  He denies loss of taste or smell, nausea, vomiting, abdominal pain, diarrhea or constipation.  Patient did not get any of the Covid vaccines    PT Comments    Patient demonstrates increased endurance/distance for taking steps in room with slow labored cadence without loss of balance, had difficulty making turns due to unsteadiness and BLE weakness.  Patient tolerated sitting up in chair after therapy - RN notified.  Patient will benefit from continued physical therapy in hospital and recommended venue below to increase strength, balance, endurance for safe ADLs and gait.    Follow Up Recommendations  SNF     Equipment Recommendations  None recommended by PT    Recommendations for Other Services       Precautions / Restrictions Precautions Precautions: Fall Restrictions Weight Bearing Restrictions: No    Mobility  Bed Mobility Overal bed mobility: Modified Independent                Transfers Overall transfer level: Needs assistance Equipment used: Rolling walker (2 wheeled) Transfers: Sit to/from [facility] Sit to Stand: Min assist Stand pivot transfers: Min  assist       General transfer comment: increased time, labored movement  Ambulation/Gait Ambulation/Gait assistance: Min assist;Mod assist Gait Distance (Feet): 22 Feet Assistive device: Rolling walker (2 wheeled) Gait Pattern/deviations: Decreased step length - right;Decreased step length - left;Decreased stride length Gait velocity: decreased   General Gait Details: slow labored unsteady cadence without loss of balance, has diffiuclty making turns using RW, limited secondary to fatigue   Stairs             Wheelchair Mobility    Modified Rankin (Stroke Patients Only)       Balance Overall balance assessment: Needs assistance Sitting-balance support: Feet supported;No upper extremity supported Sitting balance-Leahy Scale: Good Sitting balance - Comments: seated at EOB   Standing balance support: During functional activity;No upper extremity supported Standing balance-Leahy Scale: Poor Standing balance comment: fair using RW                            Cognition Arousal/Alertness: Awake/alert Behavior During Therapy: WFL for tasks assessed/performed Overall Cognitive Status: Within Functional Limits for tasks assessed                                        Exercises General Exercises - Lower Extremity Long Arc Quad: Seated;AROM;Strengthening;Both;10 reps Hip Flexion/Marching: Seated;AROM;Strengthening;Both;10 reps Toe Raises: Seated;AROM;Strengthening;Both;15 reps Heel Raises: Seated;AROM;Strengthening;15 reps;Both    General Comments        Pertinent Vitals/Pain Pain Assessment: Faces Faces Pain Scale: Hurts a  little bit Pain Location: mild chest pain, BLE Pain Descriptors / Indicators: Sore;Discomfort Pain Intervention(s): Limited activity within patient's tolerance;Monitored during session    Home Living                      Prior Function            PT Goals (current goals can now be found in the care  plan section) Acute Rehab PT Goals Patient Stated Goal: return home after rehab able to take of self and spouse PT Goal Formulation: With patient Time For Goal Achievement: 10/17/19 Potential to Achieve Goals: Good Progress towards PT goals: Progressing toward goals    Frequency    Min 3X/week      PT Plan Current plan remains appropriate    Co-evaluation              AM-PAC PT "6 Clicks" Mobility   Outcome Measure  Help needed turning from your back to your side while in a flat bed without using bedrails?: None Help needed moving from lying on your back to sitting on the side of a flat bed without using bedrails?: None Help needed moving to and from a bed to a chair (including a wheelchair)?: A Little   Help needed to walk in hospital room?: A Lot Help needed climbing 3-5 steps with a railing? : A Lot 6 Click Score: 15    End of Session Equipment Utilized During Treatment: Oxygen Activity Tolerance: Patient tolerated treatment well;Patient limited by fatigue Patient left: in chair;with call bell/phone within reach Nurse Communication: Mobility status PT Visit Diagnosis: Unsteadiness on feet (R26.81);Other abnormalities of gait and mobility (R26.89);Muscle weakness (generalized) (M62.81)     Time: 2774-1287 PT Time Calculation (min) (ACUTE ONLY): 23 min  Charges:  $Therapeutic Exercise: 8-22 mins $Therapeutic Activity: 8-22 mins                     12:36 PM, 10/07/19 Stephen Wilcox, MPT Physical Therapist with [facility] [phone] office [phone] mobile phone

## 2019-10-07 NOTE — Progress Notes (Signed)
Patient morning BG 107, held morning dose of Novolog 20 unit per Dr. Roderic Palau. Will continue to monitor.

## 2019-10-07 NOTE — Plan of Care (Signed)

## 2019-10-08 LAB — GLUCOSE, CAPILLARY
Glucose-Capillary: 68 mg/dL — ABNORMAL LOW (ref 70–99)
Glucose-Capillary: 156 mg/dL — ABNORMAL HIGH (ref 70–99)
Glucose-Capillary: 153 mg/dL — ABNORMAL HIGH (ref 70–99)
Glucose-Capillary: 185 mg/dL — ABNORMAL HIGH (ref 70–99)
Glucose-Capillary: 212 mg/dL — ABNORMAL HIGH (ref 70–99)

## 2019-10-08 MED ORDER — LIDOCAINE VISCOUS HCL 2 % MT SOLN
15.0000 mL | Freq: Three times a day (TID) | OROMUCOSAL | Status: DC
  Administered 2019-10-08 – 2019-10-09 (×4): 15 mL via OROMUCOSAL
  Filled 2019-10-08 (×4): qty 15

## 2019-10-08 NOTE — Progress Notes (Signed)
PROGRESS NOTE  Stephen Wilcox:[id number] DOB: 1955/03/17 DOA: 09/28/2019 PCP: Jake Samples, PA-C  Brief History:  64 year old male without any documented significant chronic medical problems presenting to the ED on 09/24/2019 with lightheadedness malaise, and some generalized weakness.  Patient was diagnosed with positive Covid.  He had declined monoclonal antibody infusion.  On 09/28/2019, the patient presented back to the emergency department with worsening generalized weakness, dizziness, and cough and malaise.  He was noted to have oxygen saturation of 82% room air and placed on 2 L nasal cannula.  Was subsequently started on remdesivir and IV steroids.  At the time of admission, the patient was noted to have a hemoglobin of 7.3 which was a significant drop when compared to 09/24/2019 when his hemoglobin was 9.1.  Hemoccult was noted to be positive during admission.  GI was consulted to assist with management.  Dr. Laural Golden saw the patient.  He did not feel that the patient had any overt active GI bleeding.  He recommended PPI infusion for 72 hours and subsequently transitioned to oral Protonix.  He recommended endoscopic evaluation if the patient's hemoglobin continued to drop again requiring transfusion.  The patient was transfused 2 units PRBC.  His hemoglobin went up to 9.3 and has remained stable since then.  The patient's oxygen demand gradually increased to 6 L, but subsequently remained stable.  Blood cultures grew Staph epidermidis in 1 of 2 sets.  This was thought to be a contaminant.  Empiric antibiotics were discontinued.  On 09/29/2019, the patient was noted to have atrial fibrillation.  EKG confirmed atrial fibrillation.  The patient was started on oral diltiazem.  Echocardiogram on 09/30/2019 showed EF 55 to 60%, no WMA.  Assessment/Plan: Acute respiratory failure with hypoxia secondary to COVID-19 pneumonia -sepsis ruled out -Patient was weaned off of oxygen and is  currently on 2L -Oxygen saturation 90-92% -Continue steroids, day 8.   -Continue vitamin C and zinc -CRP 19.4>> 15.8>> 7.9>> 3.6>> 1.7>> 1.0>>0.8 -Ferritin 59>> 70>> 95>> 90>> 67>> 53>>50 -D-dimer 1.89>> 1.48>> 1.26>> 1.13>> 1.31>> 1.48>>1.71 -finished 5 days remdesivir 10/03/19 -10/03/19 CTA chest--no PE; 4.8 cm lesion in the right lobe liver, incompletely characterized. Additional multiple soft tissue attenuation lesions along the lesser curvature/gastrohepatic ligament, largest measuring up to 4.6 cm in size  Gastric and Hepatic Masses -incidental finding on CTA chest -10/03/19 MR Liver--6.5 x 4.8 cm infiltrating gastric mass involving the fundus and numerous liver masses -GI consult--> -may need liver bx -patient and daughter updated  Symptomatic anemia/heme positive stool -Appreciate GI consult -Dr. Laural Golden followed the patient--> recommended IV Protonix then transition to p.o. Protonix -Will ultimately need endoscopy, likely to be performed as an outpatient -More urgent endoscopy if the patient's hemoglobin drops again -Transfused 2 units PRBC -Hemoglobin has been stable. -in retrospect, gastric mass may have been source of bleed  New onset atrial fibrillation -Currently rate controlled -Transition to long-acting diltiazem-->increase to 180 mg -Continue metoprolol -09/30/2019 echo EF 55 to 60%, no WMA -Start aspirin 81 mg daily -CHA2DS2-VASc = 1 -TSH--0.733 -hold ASA until evaluated by GI for possible endoscopy  Diabetes mellitus type 2 -Elevated CBGs secondary to steroids -09/28/2019 hemoglobin A1c 6.9 -Continue Lantus at 50 units -Resistant NovoLog sliding scale -Increase premeal insulin to 20 units  Microcytic anemia/Iron deficiency -iron saturation 3% -ferritin low despite COVID-19 -iron supplement when recovered from COVID-19  Thrombocytosis -due to iron deficiency and acute medical illness  Class II obesity -  BMI 32.14 -Lifestyle  modification  BPH -Continue tamsulosin  Bacteremia -Staph epidermidis 1 out of 2 sets -Represents contaminant  Generalized weakness -PT evaluation--> skilled nursing facility with which the patient and daughter agree      Status is: Inpatient  Remains inpatient appropriate because:IV treatments appropriate due to intensity of illness or inability to take PO   Dispo: The patient is from: Home  Anticipated d/c is to: SNF  Anticipated d/c date is: 1 day  Patient currently is medically stable to d/c.   Family Communication:   Daughter updated 9/17  Consultants:  GI--Rehman  Code Status:  FULL   DVT Prophylaxis:  SCDs   Procedures: As Listed in Progress Note Above  Antibiotics: None     Subjective: Has some pain in epigastric region after eating. No worsening shortness of breath  Objective: Vitals:   10/08/19 0550 10/08/19 1045 10/08/19 1359 10/08/19 1713  BP: 106/77  (!) 126/108 140/88  Pulse: (!) 106  91 91  Resp: 20  16 16   Temp: 98 F (36.7 C)  99.5 F (37.5 C) 98.9 F (37.2 C)  TempSrc:   Oral   SpO2: 96% 91% 93% 96%  Weight:      Height:        Intake/Output Summary (Last 24 hours) at 10/08/2019 1928 Last data filed at 10/08/2019 1700 Gross per 24 hour  Intake 237 ml  Output 500 ml  Net -263 ml   Weight change:  Exam:  General exam: Alert, awake, oriented x 3 Respiratory system: Clear to auscultation. Respiratory effort normal. Cardiovascular system:RRR. No murmurs, rubs, gallops. Gastrointestinal system: Abdomen is nondistended, soft and nontender. No organomegaly or masses felt. Normal bowel sounds heard. Central nervous system: Alert and oriented. No focal neurological deficits. Extremities: No C/C/E, +pedal pulses Skin: No rashes, lesions or ulcers Psychiatry: Judgement and insight appear normal. Mood & affect appropriate.    Data Reviewed: I have personally reviewed  following labs and imaging studies Basic Metabolic Panel: Recent Labs  Lab 10/02/19 0742 10/02/19 0742 10/03/19 0730 10/04/19 0650 10/05/19 0629 10/06/19 0716 10/07/19 0845  NA 133*   < > 134* 136 137 135 137  K 4.4   < > 4.3 4.3 4.5 4.4 4.4  CL 93*   < > 94* 97* 97* 98 101  CO2 30   < > 29 31 31  32 28  GLUCOSE 367*   < > 404* 234* 204* 186* 110*  BUN 31*   < > 41* 36* 35* 33* 30*  CREATININE 0.91   < > 0.93 0.86 0.85 0.85 0.73  CALCIUM 8.2*   < > 8.4* 8.1* 7.9* 7.7* 7.5*  MG 2.5*  --  2.6*  --   --   --   --   PHOS 3.3  --  3.4  --   --   --   --    < > = values in this interval not displayed.   Liver Function Tests: Recent Labs  Lab 10/03/19 0730 10/04/19 0650 10/05/19 0629 10/06/19 0716 10/07/19 0845  AST 34 36 28 29 26   ALT 43 42 38 36 35  ALKPHOS 50 46 49 49 52  BILITOT 0.8 0.6 0.8 0.6 0.7  PROT 5.7* 5.4* 5.4* 5.1* 5.0*  ALBUMIN 2.5* 2.4* 2.4* 2.2* 2.3*   No results for input(s): LIPASE, AMYLASE in the last 168 hours. No results for input(s): AMMONIA in the last 168 hours. Coagulation Profile: No results for input(s): INR, PROTIME in the last  168 hours. CBC: Recent Labs  Lab 10/03/19 0730 10/04/19 0650 10/05/19 0629 10/06/19 0716 10/07/19 0845  WBC 20.5* 18.1* 13.6* 13.0* 15.2*  NEUTROABS 18.9* 16.8* 12.7* 11.9* 13.8*  HGB 9.9* 9.5* 8.7* 8.4* 8.7*  HCT 33.0* 31.8* 30.0* 28.4* 30.6*  MCV 78.8* 78.5* 78.5* 78.7* 80.7  PLT 514* 463* 377 329 311   Cardiac Enzymes: No results for input(s): CKTOTAL, CKMB, CKMBINDEX, TROPONINI in the last 168 hours. BNP: Invalid input(s): POCBNP CBG: Recent Labs  Lab 10/07/19 2141 10/08/19 0751 10/08/19 0919 10/08/19 1104 10/08/19 1617  GLUCAP 279* 68* 156* 153* 185*   HbA1C: No results for input(s): HGBA1C in the last 72 hours. Urine analysis:    Component Value Date/Time   COLORURINE YELLOW 08/15/2018 1009   APPEARANCEUR CLEAR 08/15/2018 1009   LABSPEC 1.013 08/15/2018 1009   PHURINE 6.0 08/15/2018 1009    GLUCOSEU NEGATIVE 08/15/2018 1009   HGBUR MODERATE (A) 08/15/2018 1009   BILIRUBINUR NEGATIVE 08/15/2018 1009   [place] 08/15/2018 1009   PROTEINUR NEGATIVE 08/15/2018 1009   NITRITE NEGATIVE 08/15/2018 1009   LEUKOCYTESUR NEGATIVE 08/15/2018 1009   Sepsis Labs: @LABRCNTIP (procalcitonin:4,lacticidven:4) ) Recent Results (from the past 240 hour(s))  Blood Culture (routine x 2)     Status: Abnormal   Collection Time: 09/28/19 11:19 PM   Specimen: BLOOD  Result Value Ref Range Status   Specimen Description   Final    BLOOD LEFT ANTECUBITAL Performed at [facility], [address] [place], [place] [address]    Special Requests   Final    BOTTLES DRAWN AEROBIC AND ANAEROBIC Blood Culture adequate volume Performed at [facility], [address], [place], [place] [address]    Culture  Setup Time   Final    ANAEROBIC BOTTLE ONLY GRAM POSITIVE COCCI Gram Stain Report Called to,Read Back By and Verified With: T EASTER,RN@[id number]  09/29/19 MKELLY CRITICAL RESULT CALLED TO, READ BACK BY AND VERIFIED WITH: K NICHOLS RN 09/30/19 0611 JDW    Culture (A)  Final    STAPHYLOCOCCUS EPIDERMIDIS THE SIGNIFICANCE OF ISOLATING THIS ORGANISM FROM A SINGLE SET OF BLOOD CULTURES WHEN MULTIPLE SETS ARE DRAWN IS UNCERTAIN. PLEASE NOTIFY THE MICROBIOLOGY DEPARTMENT WITHIN ONE WEEK IF SPECIATION AND SENSITIVITIES ARE REQUIRED. Performed at [facility], [place] [address] [place], [place] [address]    Report Status 10/02/2019 FINAL  Final  Blood Culture ID Panel (Reflexed)     Status: Abnormal   Collection Time: 09/28/19 11:19 PM  Result Value Ref Range Status   Enterococcus faecalis NOT DETECTED NOT DETECTED Final   Enterococcus Faecium NOT DETECTED NOT DETECTED Final   Listeria monocytogenes NOT DETECTED NOT DETECTED Final   Staphylococcus species DETECTED (A) NOT DETECTED Final    Comment: CRITICAL RESULT CALLED TO, READ BACK BY AND VERIFIED WITH: Merideth Abbey RN 09/30/19 8469 JDW     Staphylococcus aureus (BCID) NOT DETECTED NOT DETECTED Final   Staphylococcus epidermidis DETECTED (A) NOT DETECTED Final    Comment: Methicillin (oxacillin) resistant coagulase negative staphylococcus. Possible blood culture contaminant (unless isolated from more than one blood culture draw or clinical case suggests pathogenicity). No antibiotic treatment is indicated for blood  culture contaminants. CRITICAL RESULT CALLED TO, READ BACK BY AND VERIFIED WITH: K NICHOLS RN 09/30/19 6295 JDW    Staphylococcus lugdunensis NOT DETECTED NOT DETECTED Final   Streptococcus species NOT DETECTED NOT DETECTED Final   Streptococcus agalactiae NOT DETECTED NOT DETECTED Final   Streptococcus pneumoniae NOT DETECTED NOT DETECTED Final  Streptococcus pyogenes NOT DETECTED NOT DETECTED Final   A.calcoaceticus-baumannii NOT DETECTED NOT DETECTED Final   Bacteroides fragilis NOT DETECTED NOT DETECTED Final   Enterobacterales NOT DETECTED NOT DETECTED Final   Enterobacter cloacae complex NOT DETECTED NOT DETECTED Final   Escherichia coli NOT DETECTED NOT DETECTED Final   Klebsiella aerogenes NOT DETECTED NOT DETECTED Final   Klebsiella oxytoca NOT DETECTED NOT DETECTED Final   Klebsiella pneumoniae NOT DETECTED NOT DETECTED Final   Proteus species NOT DETECTED NOT DETECTED Final   Salmonella species NOT DETECTED NOT DETECTED Final   Serratia marcescens NOT DETECTED NOT DETECTED Final   Haemophilus influenzae NOT DETECTED NOT DETECTED Final   Neisseria meningitidis NOT DETECTED NOT DETECTED Final   Pseudomonas aeruginosa NOT DETECTED NOT DETECTED Final   Stenotrophomonas maltophilia NOT DETECTED NOT DETECTED Final   Candida albicans NOT DETECTED NOT DETECTED Final   Candida auris NOT DETECTED NOT DETECTED Final   Candida glabrata NOT DETECTED NOT DETECTED Final   Candida krusei NOT DETECTED NOT DETECTED Final   Candida parapsilosis NOT DETECTED NOT DETECTED Final   Candida tropicalis NOT DETECTED NOT  DETECTED Final   Cryptococcus neoformans/gattii NOT DETECTED NOT DETECTED Final   Methicillin resistance mecA/C DETECTED (A) NOT DETECTED Final    Comment: CRITICAL RESULT CALLED TO, READ BACK BY AND VERIFIED WITH: Merideth Abbey RN 09/30/19 8756 JDW Performed at [facility], [address] [place], [place] [address]   Blood Culture (routine x 2)     Status: None   Collection Time: 09/28/19 11:34 PM   Specimen: BLOOD  Result Value Ref Range Status   Specimen Description BLOOD RIGHT ANTECUBITAL  Final   Special Requests   Final    BOTTLES DRAWN AEROBIC AND ANAEROBIC Blood Culture adequate volume   Culture   Final    NO GROWTH 5 DAYS Performed at [facility], [address] [place], [place] [address]    Report Status 10/03/2019 FINAL  Final     Scheduled Meds: . sodium chloride   Intravenous Once  . albuterol  2 puff Inhalation TID  . vitamin C  500 mg Oral Daily  . diltiazem  120 mg Oral Daily  . insulin aspart  0-20 Units Subcutaneous TID WC  . insulin aspart  0-5 Units Subcutaneous QHS  . insulin glargine  50 Units Subcutaneous QHS  . lidocaine  15 mL Mouth/Throat TID AC  . metoprolol tartrate  25 mg Oral TID  . pantoprazole  40 mg Oral BID AC  . predniSONE  40 mg Oral Q breakfast  . tamsulosin  0.4 mg Oral QPC supper  . zinc sulfate  220 mg Oral Daily   Continuous Infusions:  Procedures/Studies: DG Chest 2 View  Result Date: 09/15/2019 CLINICAL DATA:  Acute chest pain. EXAM: CHEST - 2 VIEW COMPARISON:  None. FINDINGS: The heart size and mediastinal contours are within normal limits. No pneumothorax or pleural effusion is noted. Right lung is clear. Minimal left basilar subsegmental atelectasis or scarring is noted. The visualized skeletal structures are unremarkable. IMPRESSION: Minimal left basilar subsegmental atelectasis or scarring. Electronically Signed   By: Marijo Conception M.D.   On: 09/15/2019 16:58   DG Ribs Unilateral Right  Result Date: 09/15/2019 CLINICAL  DATA:  Acute right chest pain. EXAM: RIGHT RIBS - 2 VIEW COMPARISON:  None. FINDINGS: No fracture or other bone lesions are seen involving the ribs. IMPRESSION: Negative. Electronically Signed   By: Bobbe Medico.D.  On: 09/15/2019 16:57   CT ANGIO CHEST PE W OR WO CONTRAST  Result Date: 10/03/2019 CLINICAL DATA:  COVID-19 positive, weakness, dizziness and shortness of breath EXAM: CT ANGIOGRAPHY CHEST WITH CONTRAST TECHNIQUE: Multidetector CT imaging of the chest was performed using the standard protocol during bolus administration of intravenous contrast. Multiplanar CT image reconstructions and MIPs were obtained to evaluate the vascular anatomy. CONTRAST:  168mL OMNIPAQUE IOHEXOL 350 MG/ML SOLN COMPARISON:  Radiograph 09/28/2019, CT abdomen pelvis 08/15/2018 FINDINGS: Cardiovascular: Satisfactory opacification the pulmonary arteries to the segmental level. No pulmonary artery filling defects are identified. Central pulmonary arteries are normal caliber. Suboptimal opacification of the thoracic aorta. No discernible acute abnormality of the thoracic aorta proximal great vessels. Aberrant right subclavian artery. Borderline cardiac enlargement. Slight reflux of contrast into the IVC. Mediastinum/Nodes: No mediastinal fluid or gas. Normal thyroid gland and thoracic inlet. No acute abnormality of the trachea or esophagus. No worrisome mediastinal, hilar or axillary adenopathy. Lungs/Pleura: Mixed areas of heterogeneous consolidative and ground-glass opacity throughout both lungs with a basilar and peripheral predominance. No pneumothorax or visible effusion. Diffuse airways thickening. Upper Abdomen: Indeterminate intermediate attenuation (38 HU) lesion measuring up to 8.5 cm in maximum transaxial dimension within the right lobe liver (4/80). Cholelithiasis with a partially calcified gallstone towards the gallbladder neck. Multiple soft tissue attenuation lesions present along the lesser  curvature/gastrohepatic ligament. Largest measuring up to 4.6 cm in size (4/91). Partially exophytic 0.7 cm lesion arising from the upper pole left kidney, corresponding with a previously seen fluid attenuation cyst on prior comparison CT, could reflect intracystic hemorrhage/proteinaceous debris. Musculoskeletal: Multilevel degenerative changes are present in the imaged portions of the spine. No acute osseous abnormality or suspicious osseous lesion. Probable intramuscular lipoma of the right infraspinatus measuring up to 6.9 by 3.5 cm (4/20). No other acute or worrisome chest wall lesions. Review of the MIP images confirms the above findings. IMPRESSION: 1. No evidence of acute pulmonary artery filling defects. 2. Mixed areas of heterogeneous consolidative and ground-glass opacity throughout both lungs with a basilar and peripheral predominance. Findings are compatible with multifocal pneumonia compatible with a COVID-19 etiology. 3. Indeterminate 4.8 cm lesion in the right lobe liver, incompletely characterized. Additional multiple soft tissue attenuation lesions along the lesser curvature/gastrohepatic ligament, largest measuring up to 4.6 cm in size. These findings are new from comparison abdominal CT 08/15/2018 and could raise concern for potential malignancy. Could consider further evaluation with abdominal MR with contrast. 4. Additional 7 mm hyperdense lesion arising from the upper pole left kidney, could reflect proteinaceous cysts, Ob also be better evaluated on dedicated abdominal imaging. 5. Cholelithiasis. 6. Aberrant right subclavian artery. These results will be called to the ordering clinician or representative by the Radiologist Assistant, and communication documented in the PACS or [employer]. Electronically Signed   By: Lovena Le M.D.   On: 10/03/2019 15:38   MR LIVER W WO CONTRAST  Result Date: 10/04/2019 CLINICAL DATA:  Evaluate liver lesions and upper abdominal adenopathy seen on  recent chest CT. EXAM: MRI ABDOMEN WITHOUT AND WITH CONTRAST TECHNIQUE: Multiplanar multisequence MR imaging of the abdomen was performed both before and after the administration of intravenous contrast. CONTRAST:  16mL GADAVIST GADOBUTROL 1 MMOL/ML IV SOLN COMPARISON:  CT abdomen/pelvis 08/15/2018 FINDINGS: Lower chest: The lungs demonstrate changes of COVID pneumonia as demonstrated on today's chest CT. Hepatobiliary: Numerous diffusion positive hepatic metastatic lesions are noted. Segment 4A lesion measures 3.7 cm on image 7/8. 5 cm lesion and segment 7 on  image 12/8. 5 cm lesion in segment 6 on image 18/8. Several other smaller lesions are noted. No intrahepatic biliary dilatation. There is diffuse fatty infiltration of the liver noted. A 2 cm gallstone is noted in the gallbladder. No common bile duct dilatation. Pancreas:  No mass, inflammation or ductal dilatation. Spleen:  Normal size. No focal lesions. Adrenals/Urinary Tract: The adrenal glands and kidneys are unremarkable. Stomach/Bowel: There is a large infiltrating gastric mass involving the fundal region 6.5 x 4.8 cm and is diffusion positive. Moderate contrast enhancement is noted. Adjacent gastrohepatic ligament lymphadenopathy with the largest node measuring 4.6 cm. Vascular/Lymphatic: The aorta is normal in caliber. No dissection. The branch vessels are patent. No retroperitoneal lymphadenopathy. Other:  No ascites or abdominal wall hernia. Musculoskeletal: No worrisome bone lesions. IMPRESSION: 1. 6.5 x 4.8 cm infiltrating gastric mass involving the fundal region. Associated gastrohepatic ligament lymphadenopathy. 2. Numerous hepatic metastatic lesions. 3. Cholelithiasis. Electronically Signed   By: Marijo Sanes M.D.   On: 10/04/2019 06:26   DG Chest Port 1 View  Result Date: 09/28/2019 CLINICAL DATA:  COVID EXAM: PORTABLE CHEST 1 VIEW COMPARISON:  09/24/2019 FINDINGS: Development of patchy bilateral airspace opacities. No pleural effusion.  Stable cardiomediastinal silhouette. No pneumothorax. IMPRESSION: Development of patchy bilateral airspace opacities, consistent with bilateral pneumonia. Electronically Signed   By: Donavan Foil M.D.   On: 09/28/2019 23:14   DG Chest Port 1 View  Result Date: 09/24/2019 CLINICAL DATA:  COVID pneumonia, weakness, fatigue EXAM: PORTABLE CHEST 1 VIEW COMPARISON:  09/15/2019 FINDINGS: The lungs are symmetrically expanded. Minimal left basilar atelectasis is again noted. No superimposed confluent pulmonary infiltrate. No pneumothorax or pleural effusion. Cardiac size within normal limits. The pulmonary vascularity is normal. No acute bone abnormality. IMPRESSION: Minimal left basilar atelectasis, unchanged. No superimposed confluent pulmonary infiltrate. Electronically Signed   By: Fidela Salisbury MD   On: 09/24/2019 20:15   ECHOCARDIOGRAM COMPLETE  Result Date: 09/30/2019    ECHOCARDIOGRAM REPORT   Patient Name:   KIMON LOEWEN Date of Exam: 09/30/2019 Medical Rec #:  [id number]         Height:       72.0 in Accession #:    [id number]        Weight:       237.0 lb Date of Birth:  [birth year]        BSA:          2.290 m Patient Age:    32 years          BP:           95/60 mmHg Patient Gender: M                 HR:           126 bpm. Exam Location:  [facility] Procedure: 2D Echo Indications:    Endocarditis I38  History:        Patient has no prior history of Echocardiogram examinations.                 Risk Factors:Non-Smoker. Pneumonia due to COVID-19 virus,                 Sepsis, Lactic Acidosis.  Sonographer:    Leavy Cella RDCS (AE) Referring Phys: [id number] OLADAPO ADEFESO IMPRESSIONS  1. Left ventricular ejection fraction, by estimation, is 55 to 60%. The left ventricle has normal function. The left ventricle has no regional wall motion abnormalities. There is moderate left ventricular hypertrophy.  Left ventricular diastolic parameters are indeterminate.  2. Right ventricular systolic function is  normal. The right ventricular size is normal. Tricuspid regurgitation signal is inadequate for assessing PA pressure.  3. The mitral valve is grossly normal. No evidence of mitral valve regurgitation.  4. The aortic valve is tricuspid. Aortic valve regurgitation is not visualized.  5. The inferior vena cava is normal in size with <50% respiratory variability, suggesting right atrial pressure of 8 mmHg.  6. Views are somewhat limited, but no definite valvular vegetations are visualized. FINDINGS  Left Ventricle: Left ventricular ejection fraction, by estimation, is 55 to 60%. The left ventricle has normal function. The left ventricle has no regional wall motion abnormalities. The left ventricular internal cavity size was normal in size. There is  moderate left ventricular hypertrophy. Left ventricular diastolic parameters are indeterminate. Right Ventricle: The right ventricular size is normal. No increase in right ventricular wall thickness. Right ventricular systolic function is normal. Tricuspid regurgitation signal is inadequate for assessing PA pressure. Left Atrium: Left atrial size was normal in size. Right Atrium: Right atrial size was normal in size. Pericardium: There is no evidence of pericardial effusion. Mitral Valve: The mitral valve is grossly normal. No evidence of mitral valve regurgitation. Tricuspid Valve: The tricuspid valve is grossly normal. Tricuspid valve regurgitation is trivial. Aortic Valve: The aortic valve is tricuspid. Aortic valve regurgitation is not visualized. Pulmonic Valve: The pulmonic valve was grossly normal. Pulmonic valve regurgitation is trivial. Aorta: The aortic root is normal in size and structure. Venous: The inferior vena cava is normal in size with less than 50% respiratory variability, suggesting right atrial pressure of 8 mmHg. IAS/Shunts: No atrial level shunt detected by color flow Doppler.  LEFT VENTRICLE PLAX 2D LVIDd:         4.37 cm Diastology LVIDs:          2.20 cm LV e' medial:    7.18 cm/s LV PW:         1.72 cm LV E/e' medial:  9.2 LV IVS:        1.46 cm LV e' lateral:   12.90 cm/s                        LV E/e' lateral: 5.1  RIGHT VENTRICLE RV S prime:     16.00 cm/s TAPSE (M-mode): 1.5 cm LEFT ATRIUM             Index       RIGHT ATRIUM           Index LA diam:        3.70 cm 1.62 cm/m  RA Area:     15.40 cm LA Vol (A2C):   63.9 ml 27.90 ml/m RA Volume:   39.80 ml  17.38 ml/m LA Vol (A4C):   40.6 ml 17.73 ml/m LA Biplane Vol: 52.4 ml 22.88 ml/m   AORTA Ao Root diam: 3.50 cm MITRAL VALVE MV Area (PHT): 6.60 cm MV Decel Time: 115 msec MV E velocity: 66.00 cm/s MV A velocity: 39.80 cm/s MV E/A ratio:  1.66 Rozann Lesches MD Electronically signed by Rozann Lesches MD Signature Date/Time: 09/30/2019/4:58:16 PM    Final     Kathie Dike, MD  [facility]  If 7PM-7AM, please contact night-coverage [email or web address]  10/08/2019, 7:28 PM   LOS: 10 days

## 2019-10-08 NOTE — Progress Notes (Signed)
C/O upper abd discomfort radiating to back after eating and rated 8 on pain scale. Contacted Dr. Roderic Palau and he ordered viscous lidocaine before meals.

## 2019-10-09 LAB — GLUCOSE, CAPILLARY
Glucose-Capillary: 55 mg/dL — ABNORMAL LOW (ref 70–99)
Glucose-Capillary: 206 mg/dL — ABNORMAL HIGH (ref 70–99)
Glucose-Capillary: 269 mg/dL — ABNORMAL HIGH (ref 70–99)
Glucose-Capillary: 276 mg/dL — ABNORMAL HIGH (ref 70–99)

## 2019-10-09 MED ORDER — HYOSCYAMINE SULFATE 0.125 MG SL SUBL
0.2500 mg | SUBLINGUAL_TABLET | Freq: Three times a day (TID) | SUBLINGUAL | Status: DC
  Administered 2019-10-10 – 2019-10-25 (×43): 0.25 mg via SUBLINGUAL
  Filled 2019-10-09 (×52): qty 2

## 2019-10-09 MED ORDER — INSULIN GLARGINE 100 UNIT/ML ~~LOC~~ SOLN
30.0000 [IU] | Freq: Every day | SUBCUTANEOUS | Status: DC
  Administered 2019-10-09: 30 [IU] via SUBCUTANEOUS
  Filled 2019-10-09 (×2): qty 0.3

## 2019-10-09 NOTE — Progress Notes (Signed)
Blood glucose this morning was 55 and yesterday 68.  Contacted Dr. Roderic Palau and he reduced insulin

## 2019-10-09 NOTE — Progress Notes (Signed)
PROGRESS NOTE  Stephen Wilcox WCB:[id number] DOB: 29-Jan-1955 DOA: 09/28/2019 PCP: Jake Samples, PA-C  Brief History:  64 year old male without any documented significant chronic medical problems presenting to the ED on 09/24/2019 with lightheadedness malaise, and some generalized weakness.  Patient was diagnosed with positive Covid.  He had declined monoclonal antibody infusion.  On 09/28/2019, the patient presented back to the emergency department with worsening generalized weakness, dizziness, and cough and malaise.  He was noted to have oxygen saturation of 82% room air and placed on 2 L nasal cannula.  Was subsequently started on remdesivir and IV steroids.  At the time of admission, the patient was noted to have a hemoglobin of 7.3 which was a significant drop when compared to 09/24/2019 when his hemoglobin was 9.1.  Hemoccult was noted to be positive during admission.  GI was consulted to assist with management.  Dr. Laural Golden saw the patient.  He did not feel that the patient had any overt active GI bleeding.  He recommended PPI infusion for 72 hours and subsequently transitioned to oral Protonix.  He recommended endoscopic evaluation if the patient's hemoglobin continued to drop again requiring transfusion.  The patient was transfused 2 units PRBC.  His hemoglobin went up to 9.3 and has remained stable since then.  The patient's oxygen demand gradually increased to 6 L, but subsequently remained stable.  Blood cultures grew Staph epidermidis in 1 of 2 sets.  This was thought to be a contaminant.  Empiric antibiotics were discontinued.  On 09/29/2019, the patient was noted to have atrial fibrillation.  EKG confirmed atrial fibrillation.  The patient was started on oral diltiazem.  Echocardiogram on 09/30/2019 showed EF 55 to 60%, no WMA.  Assessment/Plan: Acute respiratory failure with hypoxia secondary to COVID-19 pneumonia -sepsis ruled out -Patient was weaned off of oxygen and is  currently on 2L -Oxygen saturation 90-92% -Continue steroids, day 8.   -Continue vitamin C and zinc -CRP 19.4>> 15.8>> 7.9>> 3.6>> 1.7>> 1.0>>0.8 -Ferritin 59>> 70>> 95>> 90>> 67>> 53>>50 -D-dimer 1.89>> 1.48>> 1.26>> 1.13>> 1.31>> 1.48>>1.71 -finished 5 days remdesivir 10/03/19 -10/03/19 CTA chest--no PE; 4.8 cm lesion in the right lobe liver, incompletely characterized. Additional multiple soft tissue attenuation lesions along the lesser curvature/gastrohepatic ligament, largest measuring up to 4.6 cm in size  Gastric and Hepatic Masses -incidental finding on CTA chest -10/03/19 MR Liver--6.5 x 4.8 cm infiltrating gastric mass involving the fundus and numerous liver masses -GI consult--> -may need liver bx -patient and daughter updated  Symptomatic anemia/heme positive stool -Appreciate GI consult -Dr. Laural Golden followed the patient--> recommended IV Protonix then transition to p.o. Protonix -Will ultimately need endoscopy, likely to be performed as an outpatient -More urgent endoscopy if the patient's hemoglobin drops again -Transfused 2 units PRBC -Hemoglobin has been stable. -in retrospect, gastric mass may have been source of bleed  New onset atrial fibrillation -Currently rate controlled -Transition to long-acting diltiazem-->increase to 180 mg -Continue metoprolol -09/30/2019 echo EF 55 to 60%, no WMA -Start aspirin 81 mg daily -CHA2DS2-VASc = 1 -TSH--0.733 -hold ASA until evaluated by GI for possible endoscopy  Diabetes mellitus type 2 -Elevated CBGs secondary to steroids -09/28/2019 hemoglobin A1c 6.9 -Continue Lantus at 50 units -Resistant NovoLog sliding scale -Increase premeal insulin to 20 units  Microcytic anemia/Iron deficiency -iron saturation 3% -ferritin low despite COVID-19 -iron supplement when recovered from COVID-19  Thrombocytosis -due to iron deficiency and acute medical illness  Class II obesity -  BMI 32.14 -Lifestyle  modification  BPH -Continue tamsulosin  Bacteremia -Staph epidermidis 1 out of 2 sets -Represents contaminant  Generalized weakness -PT evaluation--> skilled nursing facility with which the patient and daughter agree  Status is: Inpatient  Remains inpatient appropriate because:IV treatments appropriate due to intensity of illness or inability to take PO   Dispo: The patient is from: Home  Anticipated d/c is to: SNF  Anticipated d/c date is: 1 day  Patient currently is medically stable to d/c.   Family Communication:   Daughter updated 9/17  Consultants:  GI--Rehman  Code Status:  FULL   DVT Prophylaxis:  SCDs   Procedures: As Listed in Progress Note Above  Antibiotics: None     Subjective: Describes some epigastric discomfort/pressure after eating and drinking.  This is not occur all the time.  It occurs randomly/intermittently and can occur with any kind of p.o. intake.  Objective: Vitals:   10/09/19 0900 10/09/19 1439 10/09/19 1447 10/09/19 2033  BP:  106/64    Pulse:  69    Resp:  18    Temp:  98.4 F (36.9 C)    TempSrc:      SpO2: 94% 96% 91% 92%  Weight:      Height:        Intake/Output Summary (Last 24 hours) at 10/09/2019 2036 Last data filed at 10/09/2019 1254 Gross per 24 hour  Intake 480 ml  Output 150 ml  Net 330 ml   Weight change:  Exam:  General exam: Alert, awake, oriented x 3 Respiratory system: Clear to auscultation. Respiratory effort normal. Cardiovascular system:RRR. No murmurs, rubs, gallops. Gastrointestinal system: Abdomen is nondistended, soft and nontender. No organomegaly or masses felt. Normal bowel sounds heard. Central nervous system: Alert and oriented. No focal neurological deficits. Extremities: No C/C/E, +pedal pulses Skin: No rashes, lesions or ulcers Psychiatry: Judgement and insight appear normal. Mood & affect appropriate.     Data Reviewed: I  have personally reviewed following labs and imaging studies Basic Metabolic Panel: Recent Labs  Lab 10/03/19 0730 10/04/19 0650 10/05/19 0629 10/06/19 0716 10/07/19 0845  NA 134* 136 137 135 137  K 4.3 4.3 4.5 4.4 4.4  CL 94* 97* 97* 98 101  CO2 29 31 31  32 28  GLUCOSE 404* 234* 204* 186* 110*  BUN 41* 36* 35* 33* 30*  CREATININE 0.93 0.86 0.85 0.85 0.73  CALCIUM 8.4* 8.1* 7.9* 7.7* 7.5*  MG 2.6*  --   --   --   --   PHOS 3.4  --   --   --   --    Liver Function Tests: Recent Labs  Lab 10/03/19 0730 10/04/19 0650 10/05/19 0629 10/06/19 0716 10/07/19 0845  AST 34 36 28 29 26   ALT 43 42 38 36 35  ALKPHOS 50 46 49 49 52  BILITOT 0.8 0.6 0.8 0.6 0.7  PROT 5.7* 5.4* 5.4* 5.1* 5.0*  ALBUMIN 2.5* 2.4* 2.4* 2.2* 2.3*   No results for input(s): LIPASE, AMYLASE in the last 168 hours. No results for input(s): AMMONIA in the last 168 hours. Coagulation Profile: No results for input(s): INR, PROTIME in the last 168 hours. CBC: Recent Labs  Lab 10/03/19 0730 10/04/19 0650 10/05/19 0629 10/06/19 0716 10/07/19 0845  WBC 20.5* 18.1* 13.6* 13.0* 15.2*  NEUTROABS 18.9* 16.8* 12.7* 11.9* 13.8*  HGB 9.9* 9.5* 8.7* 8.4* 8.7*  HCT 33.0* 31.8* 30.0* 28.4* 30.6*  MCV 78.8* 78.5* 78.5* 78.7* 80.7  PLT 514* 463* 377 329  311   Cardiac Enzymes: No results for input(s): CKTOTAL, CKMB, CKMBINDEX, TROPONINI in the last 168 hours. BNP: Invalid input(s): POCBNP CBG: Recent Labs  Lab 10/08/19 1617 10/08/19 2158 10/09/19 0748 10/09/19 1106 10/09/19 1631  GLUCAP 185* 212* 55* 206* 269*   HbA1C: No results for input(s): HGBA1C in the last 72 hours. Urine analysis:    Component Value Date/Time   COLORURINE YELLOW 08/15/2018 1009   APPEARANCEUR CLEAR 08/15/2018 1009   LABSPEC 1.013 08/15/2018 1009   PHURINE 6.0 08/15/2018 1009   GLUCOSEU NEGATIVE 08/15/2018 1009   HGBUR MODERATE (A) 08/15/2018 1009   BILIRUBINUR NEGATIVE 08/15/2018 1009   [place] 08/15/2018 1009    PROTEINUR NEGATIVE 08/15/2018 1009   NITRITE NEGATIVE 08/15/2018 1009   LEUKOCYTESUR NEGATIVE 08/15/2018 1009   Sepsis Labs: @LABRCNTIP (procalcitonin:4,lacticidven:4) ) No results found for this or any previous visit (from the past 240 hour(s)).   Scheduled Meds: . sodium chloride   Intravenous Once  . albuterol  2 puff Inhalation TID  . vitamin C  500 mg Oral Daily  . diltiazem  120 mg Oral Daily  . [START ON 10/10/2019] hyoscyamine  0.25 mg Sublingual TID AC  . insulin aspart  0-20 Units Subcutaneous TID WC  . insulin aspart  0-5 Units Subcutaneous QHS  . insulin glargine  30 Units Subcutaneous QHS  . metoprolol tartrate  25 mg Oral TID  . pantoprazole  40 mg Oral BID AC  . predniSONE  40 mg Oral Q breakfast  . tamsulosin  0.4 mg Oral QPC supper  . zinc sulfate  220 mg Oral Daily   Continuous Infusions:  Procedures/Studies: DG Chest 2 View  Result Date: 09/15/2019 CLINICAL DATA:  Acute chest pain. EXAM: CHEST - 2 VIEW COMPARISON:  None. FINDINGS: The heart size and mediastinal contours are within normal limits. No pneumothorax or pleural effusion is noted. Right lung is clear. Minimal left basilar subsegmental atelectasis or scarring is noted. The visualized skeletal structures are unremarkable. IMPRESSION: Minimal left basilar subsegmental atelectasis or scarring. Electronically Signed   By: Marijo Conception M.D.   On: 09/15/2019 16:58   DG Ribs Unilateral Right  Result Date: 09/15/2019 CLINICAL DATA:  Acute right chest pain. EXAM: RIGHT RIBS - 2 VIEW COMPARISON:  None. FINDINGS: No fracture or other bone lesions are seen involving the ribs. IMPRESSION: Negative. Electronically Signed   By: Marijo Conception M.D.   On: 09/15/2019 16:57   CT ANGIO CHEST PE W OR WO CONTRAST  Result Date: 10/03/2019 CLINICAL DATA:  COVID-19 positive, weakness, dizziness and shortness of breath EXAM: CT ANGIOGRAPHY CHEST WITH CONTRAST TECHNIQUE: Multidetector CT imaging of the chest was performed  using the standard protocol during bolus administration of intravenous contrast. Multiplanar CT image reconstructions and MIPs were obtained to evaluate the vascular anatomy. CONTRAST:  158mL OMNIPAQUE IOHEXOL 350 MG/ML SOLN COMPARISON:  Radiograph 09/28/2019, CT abdomen pelvis 08/15/2018 FINDINGS: Cardiovascular: Satisfactory opacification the pulmonary arteries to the segmental level. No pulmonary artery filling defects are identified. Central pulmonary arteries are normal caliber. Suboptimal opacification of the thoracic aorta. No discernible acute abnormality of the thoracic aorta proximal great vessels. Aberrant right subclavian artery. Borderline cardiac enlargement. Slight reflux of contrast into the IVC. Mediastinum/Nodes: No mediastinal fluid or gas. Normal thyroid gland and thoracic inlet. No acute abnormality of the trachea or esophagus. No worrisome mediastinal, hilar or axillary adenopathy. Lungs/Pleura: Mixed areas of heterogeneous consolidative and ground-glass opacity throughout both lungs with a basilar and peripheral predominance. No pneumothorax or  visible effusion. Diffuse airways thickening. Upper Abdomen: Indeterminate intermediate attenuation (38 HU) lesion measuring up to 8.5 cm in maximum transaxial dimension within the right lobe liver (4/80). Cholelithiasis with a partially calcified gallstone towards the gallbladder neck. Multiple soft tissue attenuation lesions present along the lesser curvature/gastrohepatic ligament. Largest measuring up to 4.6 cm in size (4/91). Partially exophytic 0.7 cm lesion arising from the upper pole left kidney, corresponding with a previously seen fluid attenuation cyst on prior comparison CT, could reflect intracystic hemorrhage/proteinaceous debris. Musculoskeletal: Multilevel degenerative changes are present in the imaged portions of the spine. No acute osseous abnormality or suspicious osseous lesion. Probable intramuscular lipoma of the right  infraspinatus measuring up to 6.9 by 3.5 cm (4/20). No other acute or worrisome chest wall lesions. Review of the MIP images confirms the above findings. IMPRESSION: 1. No evidence of acute pulmonary artery filling defects. 2. Mixed areas of heterogeneous consolidative and ground-glass opacity throughout both lungs with a basilar and peripheral predominance. Findings are compatible with multifocal pneumonia compatible with a COVID-19 etiology. 3. Indeterminate 4.8 cm lesion in the right lobe liver, incompletely characterized. Additional multiple soft tissue attenuation lesions along the lesser curvature/gastrohepatic ligament, largest measuring up to 4.6 cm in size. These findings are new from comparison abdominal CT 08/15/2018 and could raise concern for potential malignancy. Could consider further evaluation with abdominal MR with contrast. 4. Additional 7 mm hyperdense lesion arising from the upper pole left kidney, could reflect proteinaceous cysts, Ob also be better evaluated on dedicated abdominal imaging. 5. Cholelithiasis. 6. Aberrant right subclavian artery. These results will be called to the ordering clinician or representative by the Radiologist Assistant, and communication documented in the PACS or [employer]. Electronically Signed   By: Lovena Le M.D.   On: 10/03/2019 15:38   MR LIVER W WO CONTRAST  Result Date: 10/04/2019 CLINICAL DATA:  Evaluate liver lesions and upper abdominal adenopathy seen on recent chest CT. EXAM: MRI ABDOMEN WITHOUT AND WITH CONTRAST TECHNIQUE: Multiplanar multisequence MR imaging of the abdomen was performed both before and after the administration of intravenous contrast. CONTRAST:  31mL GADAVIST GADOBUTROL 1 MMOL/ML IV SOLN COMPARISON:  CT abdomen/pelvis 08/15/2018 FINDINGS: Lower chest: The lungs demonstrate changes of COVID pneumonia as demonstrated on today's chest CT. Hepatobiliary: Numerous diffusion positive hepatic metastatic lesions are noted. Segment  4A lesion measures 3.7 cm on image 7/8. 5 cm lesion and segment 7 on image 12/8. 5 cm lesion in segment 6 on image 18/8. Several other smaller lesions are noted. No intrahepatic biliary dilatation. There is diffuse fatty infiltration of the liver noted. A 2 cm gallstone is noted in the gallbladder. No common bile duct dilatation. Pancreas:  No mass, inflammation or ductal dilatation. Spleen:  Normal size. No focal lesions. Adrenals/Urinary Tract: The adrenal glands and kidneys are unremarkable. Stomach/Bowel: There is a large infiltrating gastric mass involving the fundal region 6.5 x 4.8 cm and is diffusion positive. Moderate contrast enhancement is noted. Adjacent gastrohepatic ligament lymphadenopathy with the largest node measuring 4.6 cm. Vascular/Lymphatic: The aorta is normal in caliber. No dissection. The branch vessels are patent. No retroperitoneal lymphadenopathy. Other:  No ascites or abdominal wall hernia. Musculoskeletal: No worrisome bone lesions. IMPRESSION: 1. 6.5 x 4.8 cm infiltrating gastric mass involving the fundal region. Associated gastrohepatic ligament lymphadenopathy. 2. Numerous hepatic metastatic lesions. 3. Cholelithiasis. Electronically Signed   By: Marijo Sanes M.D.   On: 10/04/2019 06:26   DG Chest Port 1 View  Result Date: 09/28/2019 CLINICAL DATA:  COVID EXAM: PORTABLE CHEST 1 VIEW COMPARISON:  09/24/2019 FINDINGS: Development of patchy bilateral airspace opacities. No pleural effusion. Stable cardiomediastinal silhouette. No pneumothorax. IMPRESSION: Development of patchy bilateral airspace opacities, consistent with bilateral pneumonia. Electronically Signed   By: Donavan Foil M.D.   On: 09/28/2019 23:14   DG Chest Port 1 View  Result Date: 09/24/2019 CLINICAL DATA:  COVID pneumonia, weakness, fatigue EXAM: PORTABLE CHEST 1 VIEW COMPARISON:  09/15/2019 FINDINGS: The lungs are symmetrically expanded. Minimal left basilar atelectasis is again noted. No superimposed confluent  pulmonary infiltrate. No pneumothorax or pleural effusion. Cardiac size within normal limits. The pulmonary vascularity is normal. No acute bone abnormality. IMPRESSION: Minimal left basilar atelectasis, unchanged. No superimposed confluent pulmonary infiltrate. Electronically Signed   By: Fidela Salisbury MD   On: 09/24/2019 20:15   ECHOCARDIOGRAM COMPLETE  Result Date: 09/30/2019    ECHOCARDIOGRAM REPORT   Patient Name:   PANTELIS ELGERSMA Date of Exam: 09/30/2019 Medical Rec #:  [id number]         Height:       72.0 in Accession #:    [id number]        Weight:       237.0 lb Date of Birth:  Oct 23, 1955        BSA:          2.290 m Patient Age:    21 years          BP:           95/60 mmHg Patient Gender: M                 HR:           126 bpm. Exam Location:  [facility] Procedure: 2D Echo Indications:    Endocarditis I38  History:        Patient has no prior history of Echocardiogram examinations.                 Risk Factors:Non-Smoker. Pneumonia due to COVID-19 virus,                 Sepsis, Lactic Acidosis.  Sonographer:    Leavy Cella RDCS (AE) Referring Phys: [id number] OLADAPO ADEFESO IMPRESSIONS  1. Left ventricular ejection fraction, by estimation, is 55 to 60%. The left ventricle has normal function. The left ventricle has no regional wall motion abnormalities. There is moderate left ventricular hypertrophy. Left ventricular diastolic parameters are indeterminate.  2. Right ventricular systolic function is normal. The right ventricular size is normal. Tricuspid regurgitation signal is inadequate for assessing PA pressure.  3. The mitral valve is grossly normal. No evidence of mitral valve regurgitation.  4. The aortic valve is tricuspid. Aortic valve regurgitation is not visualized.  5. The inferior vena cava is normal in size with <50% respiratory variability, suggesting right atrial pressure of 8 mmHg.  6. Views are somewhat limited, but no definite valvular vegetations are visualized. FINDINGS  Left  Ventricle: Left ventricular ejection fraction, by estimation, is 55 to 60%. The left ventricle has normal function. The left ventricle has no regional wall motion abnormalities. The left ventricular internal cavity size was normal in size. There is  moderate left ventricular hypertrophy. Left ventricular diastolic parameters are indeterminate. Right Ventricle: The right ventricular size is normal. No increase in right ventricular wall thickness. Right ventricular systolic function is normal. Tricuspid regurgitation signal is inadequate for assessing PA pressure. Left Atrium: Left atrial size was normal in size. Right Atrium: Right atrial  size was normal in size. Pericardium: There is no evidence of pericardial effusion. Mitral Valve: The mitral valve is grossly normal. No evidence of mitral valve regurgitation. Tricuspid Valve: The tricuspid valve is grossly normal. Tricuspid valve regurgitation is trivial. Aortic Valve: The aortic valve is tricuspid. Aortic valve regurgitation is not visualized. Pulmonic Valve: The pulmonic valve was grossly normal. Pulmonic valve regurgitation is trivial. Aorta: The aortic root is normal in size and structure. Venous: The inferior vena cava is normal in size with less than 50% respiratory variability, suggesting right atrial pressure of 8 mmHg. IAS/Shunts: No atrial level shunt detected by color flow Doppler.  LEFT VENTRICLE PLAX 2D LVIDd:         4.37 cm Diastology LVIDs:         2.20 cm LV e' medial:    7.18 cm/s LV PW:         1.72 cm LV E/e' medial:  9.2 LV IVS:        1.46 cm LV e' lateral:   12.90 cm/s                        LV E/e' lateral: 5.1  RIGHT VENTRICLE RV S prime:     16.00 cm/s TAPSE (M-mode): 1.5 cm LEFT ATRIUM             Index       RIGHT ATRIUM           Index LA diam:        3.70 cm 1.62 cm/m  RA Area:     15.40 cm LA Vol (A2C):   63.9 ml 27.90 ml/m RA Volume:   39.80 ml  17.38 ml/m LA Vol (A4C):   40.6 ml 17.73 ml/m LA Biplane Vol: 52.4 ml 22.88 ml/m    AORTA Ao Root diam: 3.50 cm MITRAL VALVE MV Area (PHT): 6.60 cm MV Decel Time: 115 msec MV E velocity: 66.00 cm/s MV A velocity: 39.80 cm/s MV E/A ratio:  1.66 Rozann Lesches MD Electronically signed by Rozann Lesches MD Signature Date/Time: 09/30/2019/4:58:16 PM    Final     Kathie Dike, MD  [facility]  If 7PM-7AM, please contact night-coverage [email or web address]  10/09/2019, 8:36 PM   LOS: 11 days

## 2019-10-10 ENCOUNTER — Inpatient Hospital Stay (HOSPITAL_COMMUNITY): Payer: 59 | Admitting: Anesthesiology

## 2019-10-10 ENCOUNTER — Encounter (HOSPITAL_COMMUNITY): Payer: Self-pay | Admitting: Family Medicine

## 2019-10-10 ENCOUNTER — Encounter (HOSPITAL_COMMUNITY)
Admission: EM | Disposition: A | Discharge status: Hospice | Payer: Self-pay | Source: Home / Self Care | Attending: Family Medicine

## 2019-10-10 DIAGNOSIS — R933 Abnormal findings on diagnostic imaging of other parts of digestive tract: Secondary | ICD-10-CM

## 2019-10-10 DIAGNOSIS — D500 Iron deficiency anemia secondary to blood loss (chronic): Secondary | ICD-10-CM

## 2019-10-10 DIAGNOSIS — C160 Malignant neoplasm of cardia: Secondary | ICD-10-CM

## 2019-10-10 DIAGNOSIS — K3189 Other diseases of stomach and duodenum: Secondary | ICD-10-CM

## 2019-10-10 DIAGNOSIS — C155 Malignant neoplasm of lower third of esophagus: Secondary | ICD-10-CM

## 2019-10-10 DIAGNOSIS — K317 Polyp of stomach and duodenum: Secondary | ICD-10-CM

## 2019-10-10 HISTORY — PX: ESOPHAGOGASTRODUODENOSCOPY (EGD) WITH PROPOFOL: SHX5813

## 2019-10-10 HISTORY — PX: BIOPSY: SHX5522

## 2019-10-10 LAB — CBC
HCT: 26.9 % — ABNORMAL LOW (ref 39.0–52.0)
Hemoglobin: 7.7 g/dL — ABNORMAL LOW (ref 13.0–17.0)
MCH: 23 pg — ABNORMAL LOW (ref 26.0–34.0)
MCHC: 28.6 g/dL — ABNORMAL LOW (ref 30.0–36.0)
MCV: 80.3 fL (ref 80.0–100.0)
Platelets: 266 10*3/uL (ref 150–400)
RBC: 3.35 MIL/uL — ABNORMAL LOW (ref 4.22–5.81)
RDW: 20.4 % — ABNORMAL HIGH (ref 11.5–15.5)
WBC: 13.9 10*3/uL — ABNORMAL HIGH (ref 4.0–10.5)
nRBC: 0 % (ref 0.0–0.2)

## 2019-10-10 LAB — GLUCOSE, CAPILLARY
Glucose-Capillary: 67 mg/dL — ABNORMAL LOW (ref 70–99)
Glucose-Capillary: 121 mg/dL — ABNORMAL HIGH (ref 70–99)
Glucose-Capillary: 110 mg/dL — ABNORMAL HIGH (ref 70–99)
Glucose-Capillary: 128 mg/dL — ABNORMAL HIGH (ref 70–99)

## 2019-10-10 SURGERY — ESOPHAGOGASTRODUODENOSCOPY (EGD) WITH PROPOFOL
Anesthesia: General

## 2019-10-10 MED ORDER — EPHEDRINE 5 MG/ML INJ
INTRAVENOUS | Status: AC
  Filled 2019-10-10: qty 20

## 2019-10-10 MED ORDER — MIDAZOLAM HCL 2 MG/2ML IJ SOLN
INTRAMUSCULAR | Status: AC
  Filled 2019-10-10: qty 2

## 2019-10-10 MED ORDER — PROPOFOL 10 MG/ML IV BOLUS
INTRAVENOUS | Status: DC | PRN
  Administered 2019-10-10: 50 mg via INTRAVENOUS

## 2019-10-10 MED ORDER — LIDOCAINE VISCOUS HCL 2 % MT SOLN: 15.0000 mL | Freq: Once | OROMUCOSAL | Status: DC

## 2019-10-10 MED ORDER — EPHEDRINE SULFATE-NACL 50-0.9 MG/10ML-% IV SOSY
PREFILLED_SYRINGE | INTRAVENOUS | Status: DC | PRN
  Administered 2019-10-10: 10 mg via INTRAVENOUS

## 2019-10-10 MED ORDER — STERILE WATER FOR IRRIGATION IR SOLN
Status: DC | PRN
  Administered 2019-10-10: 100 mL

## 2019-10-10 MED ORDER — GLYCOPYRROLATE PF 0.2 MG/ML IJ SOSY
PREFILLED_SYRINGE | INTRAMUSCULAR | Status: DC | PRN
  Administered 2019-10-10: .2 mg via INTRAVENOUS

## 2019-10-10 MED ORDER — KETAMINE HCL 10 MG/ML IJ SOLN
INTRAMUSCULAR | Status: DC | PRN
  Administered 2019-10-10: 10 mg via INTRAVENOUS

## 2019-10-10 MED ORDER — KETAMINE HCL 50 MG/5ML IJ SOSY
PREFILLED_SYRINGE | INTRAMUSCULAR | Status: AC
  Filled 2019-10-10: qty 5

## 2019-10-10 MED ORDER — SODIUM CHLORIDE 0.9 % IV SOLN: INTRAVENOUS | Status: DC | PRN

## 2019-10-10 MED ORDER — LIDOCAINE 2% (20 MG/ML) 5 ML SYRINGE
INTRAMUSCULAR | Status: DC | PRN
  Administered 2019-10-10: 60 mg via INTRAVENOUS

## 2019-10-10 MED ORDER — PROPOFOL 500 MG/50ML IV EMUL
INTRAVENOUS | Status: DC | PRN
  Administered 2019-10-10: 150 ug/kg/min via INTRAVENOUS

## 2019-10-10 MED ORDER — SODIUM CHLORIDE 0.9 % IV SOLN: INTRAVENOUS | Status: DC

## 2019-10-10 MED ORDER — SUCCINYLCHOLINE CHLORIDE 200 MG/10ML IV SOSY
PREFILLED_SYRINGE | INTRAVENOUS | Status: AC
  Filled 2019-10-10: qty 10

## 2019-10-10 MED ORDER — PHENYLEPHRINE 40 MCG/ML (10ML) SYRINGE FOR IV PUSH (FOR BLOOD PRESSURE SUPPORT)
PREFILLED_SYRINGE | INTRAVENOUS | Status: DC | PRN
  Administered 2019-10-10: 80 ug via INTRAVENOUS
  Administered 2019-10-10: 200 ug via INTRAVENOUS
  Administered 2019-10-10: 120 ug via INTRAVENOUS

## 2019-10-10 MED ORDER — MIDAZOLAM HCL 5 MG/5ML IJ SOLN
INTRAMUSCULAR | Status: DC | PRN
  Administered 2019-10-10: 1 mg via INTRAVENOUS

## 2019-10-10 MED ORDER — INSULIN GLARGINE 100 UNIT/ML ~~LOC~~ SOLN
15.0000 [IU] | Freq: Every day | SUBCUTANEOUS | Status: DC
  Filled 2019-10-10: qty 0.15

## 2019-10-10 MED ORDER — LIDOCAINE VISCOUS HCL 2 % MT SOLN
OROMUCOSAL | Status: AC
  Filled 2019-10-10: qty 15

## 2019-10-10 MED ORDER — PHENYLEPHRINE 40 MCG/ML (10ML) SYRINGE FOR IV PUSH (FOR BLOOD PRESSURE SUPPORT)
PREFILLED_SYRINGE | INTRAVENOUS | Status: AC
  Filled 2019-10-10: qty 20

## 2019-10-10 MED ORDER — ALBUTEROL SULFATE HFA 108 (90 BASE) MCG/ACT IN AERS
2.0000 | INHALATION_SPRAY | Freq: Four times a day (QID) | RESPIRATORY_TRACT | Status: DC | PRN
  Administered 2019-10-20: 2 via RESPIRATORY_TRACT

## 2019-10-10 MED ORDER — LACTATED RINGERS IV SOLN: Freq: Once | INTRAVENOUS | Status: DC

## 2019-10-10 NOTE — Transfer of Care (Signed)
Immediate Anesthesia Transfer of Care Note  Patient: Stephen Wilcox  Procedure(s) Performed: ESOPHAGOGASTRODUODENOSCOPY (EGD) WITH PROPOFOL (N/A ) BIOPSY  Patient Location: PACU  Anesthesia Type:General  Level of Consciousness: awake, alert , oriented and sedated  Airway & Oxygen Therapy: Patient Spontanous Breathing and Patient connected to nasal cannula oxygen  Post-op Assessment: Report given to RN and Post -op Vital signs reviewed and stable  Post vital signs: Reviewed and stable  Last Vitals:  Vitals Value Taken Time  BP 101/58 10/10/19 1631  Temp    Pulse 82 10/10/19 1631  Resp 29 10/10/19 1631  SpO2 98 % 10/10/19 1631    Last Pain:  Vitals:   10/10/19 1631  TempSrc:   PainSc: Asleep      Patients Stated Pain Goal: 0 (72/27/73 7505)  Complications: No complications documented.

## 2019-10-10 NOTE — Plan of Care (Signed)
°  Problem: Education: Goal: Knowledge of General Education information will improve Description: Including pain rating scale, medication(s)/side effects and non-pharmacologic comfort measures Outcome: Progressing   Problem: Health Behavior/Discharge Planning: Goal: Ability to manage health-related needs will improve Outcome: Progressing   Problem: Clinical Measurements: Goal: Ability to maintain clinical measurements within normal limits will improve Outcome: Progressing Goal: Will remain free from infection Outcome: Progressing Goal: Diagnostic test results will improve Outcome: Progressing Goal: Respiratory complications will improve Outcome: Progressing Goal: Cardiovascular complication will be avoided Outcome: Progressing   Problem: Activity: Goal: Risk for activity intolerance will decrease Outcome: Progressing   Problem: Nutrition: Goal: Adequate nutrition will be maintained Outcome: Progressing   Problem: Coping: Goal: Level of anxiety will decrease Outcome: Progressing   Problem: Elimination: Goal: Will not experience complications related to bowel motility Outcome: Progressing Goal: Will not experience complications related to urinary retention Outcome: Progressing   Problem: Pain Managment: Goal: General experience of comfort will improve Outcome: Progressing   Problem: Safety: Goal: Ability to remain free from injury will improve Outcome: Progressing   Problem: Skin Integrity: Goal: Risk for impaired skin integrity will decrease Outcome: Progressing   Problem: Education: Goal: Knowledge of risk factors and measures for prevention of condition will improve Outcome: Progressing   Problem: Coping: Goal: Psychosocial and spiritual needs will be supported Outcome: Progressing   Problem: Coping: Goal: Psychosocial and spiritual needs will be supported Outcome: Progressing   Problem: Respiratory: Goal: Will maintain a patent airway Outcome:  Progressing Goal: Complications related to the disease process, condition or treatment will be avoided or minimized Outcome: Progressing

## 2019-10-10 NOTE — Progress Notes (Addendum)
Inpatient Diabetes Program Recommendations  AACE/ADA: New Consensus Statement on Inpatient Glycemic Control (2015)  Target Ranges:  Prepandial:   less than 140 mg/dL      Peak postprandial:   less than 180 mg/dL (1-2 hours)      Critically ill patients:  140 - 180 mg/dL   Lab Results  Component Value Date   GLUCAP 67 (L) 10/10/2019   HGBA1C 6.9 (H) 09/28/2019    Review of Glycemic Control Results for Stephen Wilcox, Stephen Wilcox (MRN [id number]) as of 10/10/2019 10:51  Ref. Range 10/09/2019 07:48 10/09/2019 11:06 10/09/2019 16:31 10/09/2019 21:26 10/10/2019 08:01  Glucose-Capillary Latest Ref Range: 70 - 99 mg/dL 55 (L) 206 (H) 269 (H) 276 (H) 67 (L)   Diabetes history:  No DM history Outpatient Diabetes medications:  None Current orders for Inpatient glycemic control:  Novolog 0-20 units tid Novolog 0-5 units qhs Lantus 15 units daily (Decreased today from 30 units) Prednisone 40 mg daily  Inpatient Diabetes Program Recommendations:     -Novolog 3 units tid with meals if eats at least 50% -Carb modified diet  Will continue to follow while inpatient.  Thank you, Reche Dixon, RN, BSN Diabetes Coordinator Inpatient Diabetes Program [phone] (team pager from 8a-5p)

## 2019-10-10 NOTE — Progress Notes (Signed)
Subjective:  Patient says he had small amount of breakfast because he developed dysphagia.  He has noted some pain.  He has had dysphagia and odynophagia for past few weeks.  He has not lost any weight.  No history of melena or rectal bleeding.  He just had a bowel movement and I looked at his stool.  Stool is formed and dark brown but not black. Patient denies shortness of breath or cough. He is on low-dose aspirin but does not take OTC NSAIDs.  Current Medications:  Current Facility-Administered Medications:    0.9 %  sodium chloride infusion (Manually program via Guardrails IV Fluids), , Intravenous, Once, Lang Snow, FNP, Last Rate: 10 mL/hr at 09/30/19 2054, Restarted at 09/30/19 2054   albuterol (VENTOLIN HFA) 108 (90 Base) MCG/ACT inhaler 2 puff, 2 puff, Inhalation, TID, Tat, David, MD, 2 puff at 10/10/19 0804   ascorbic acid (VITAMIN C) tablet 500 mg, 500 mg, Oral, Daily, Adefeso, Oladapo, DO, 500 mg at 10/10/19 0849   chlorpheniramine-HYDROcodone (TUSSIONEX) 10-8 MG/5ML suspension 5 mL, 5 mL, Oral, Q12H PRN, Adefeso, Oladapo, DO, 5 mL at 10/07/19 0936   diltiazem (CARDIZEM CD) 24 hr capsule 120 mg, 120 mg, Oral, Daily, Tat, David, MD, 120 mg at 10/10/19 0850   guaiFENesin-dextromethorphan (ROBITUSSIN DM) 100-10 MG/5ML syrup 10 mL, 10 mL, Oral, Q4H PRN, Adefeso, Oladapo, DO, 10 mL at 10/09/19 2142   hyoscyamine (LEVSIN SL) SL tablet 0.25 mg, 0.25 mg, Sublingual, TID AC, Memon, Jehanzeb, MD, 0.25 mg at 10/10/19 0849   insulin aspart (novoLOG) injection 0-20 Units, 0-20 Units, Subcutaneous, TID WC, Emokpae, Courage, MD, 11 Units at 10/09/19 1743   insulin aspart (novoLOG) injection 0-5 Units, 0-5 Units, Subcutaneous, QHS, Emokpae, Courage, MD, 3 Units at 10/09/19 2143   insulin glargine (LANTUS) injection 15 Units, 15 Units, Subcutaneous, QHS, Memon, Jehanzeb, MD   metoprolol tartrate (LOPRESSOR) tablet 25 mg, 25 mg, Oral, TID, Emokpae, Courage, MD, 25 mg at 10/10/19 0850    pantoprazole (PROTONIX) EC tablet 40 mg, 40 mg, Oral, BID AC, Memon, Jehanzeb, MD, 40 mg at 10/10/19 0849   predniSONE (DELTASONE) tablet 40 mg, 40 mg, Oral, Q breakfast, Memon, Jehanzeb, MD, 40 mg at 10/10/19 0849   tamsulosin (FLOMAX) capsule 0.4 mg, 0.4 mg, Oral, QPC supper, Emokpae, Courage, MD, 0.4 mg at 10/09/19 1745   zinc sulfate capsule 220 mg, 220 mg, Oral, Daily, Adefeso, Oladapo, DO, 220 mg at 10/10/19 0850  Objective: Blood pressure 110/75, pulse 62, temperature 98.2 F (36.8 C), temperature source Oral, resp. rate 18, height 6' (1.829 m), weight 107.5 kg, SpO2 94 %. Patient is alert and in no acute distress. Cardiac exam with regular rhythm normal S1 and S2.  No murmur gallop noted. Auscultation lungs reveal vesicular breath sounds bilaterally. Abdomen is full but soft and nontender with organomegaly or masses No LE edema noted.  Labs/studies Results:  CBC Latest Ref Rng & Units 10/10/2019 10/07/2019 10/06/2019  WBC 4.0 - 10.5 K/uL 13.9(H) 15.2(H) 13.0(H)  Hemoglobin 13.0 - 17.0 g/dL 7.7(L) 8.7(L) 8.4(L)  Hematocrit 39 - 52 % 26.9(L) 30.6(L) 28.4(L)  Platelets 150 - 400 K/uL 266 311 329    CMP Latest Ref Rng & Units 10/07/2019 10/06/2019 10/05/2019  Glucose 70 - 99 mg/dL 110(H) 186(H) 204(H)  BUN 8 - 23 mg/dL 30(H) 33(H) 35(H)  Creatinine 0.61 - 1.24 mg/dL 0.73 0.85 0.85  Sodium 135 - 145 mmol/L 137 135 137  Potassium 3.5 - 5.1 mmol/L 4.4 4.4 4.5  Chloride 98 - 111  mmol/L 101 98 97(L)  CO2 22 - 32 mmol/L 28 32 31  Calcium 8.9 - 10.3 mg/dL 7.5(L) 7.7(L) 7.9(L)  Total Protein 6.5 - 8.1 g/dL 5.0(L) 5.1(L) 5.4(L)  Total Bilirubin 0.3 - 1.2 mg/dL 0.7 0.6 0.8  Alkaline Phos 38 - 126 U/L 52 49 49  AST 15 - 41 U/L 26 29 28   ALT 0 - 44 U/L 35 36 38    Hepatic Function Latest Ref Rng & Units 10/07/2019 10/06/2019 10/05/2019  Total Protein 6.5 - 8.1 g/dL 5.0(L) 5.1(L) 5.4(L)  Albumin 3.5 - 5.0 g/dL 2.3(L) 2.2(L) 2.4(L)  AST 15 - 41 U/L 26 29 28   ALT 0 - 44 U/L 35 36 38  Alk  Phosphatase 38 - 126 U/L 52 49 49  Total Bilirubin 0.3 - 1.2 mg/dL 0.7 0.6 0.8    Lab Results  Component Value Date   CRP 0.6 10/07/2019    MR liver reviewed.  Multiple liver lesions concerning for metastatic disease.  6.5 x 4.8 cm gastric mass involving fundal region.  He also had gastrohepatic lymphadenopathy and cholelithiasis.  Assessment:  #1.  Iron deficiency anemia.  Patient was noted to be anemic when he presented with COVID Pneumonia 16 days ago.  Patient was seen by Dr. Jenetta Downer on 09/29/2019 while he was quite symptomatic from respiratory illness and GI work-up was delayed.  He received 1 unit of PRBCs.  Several days ago.  His hemoglobin is gradually dropping again. His chest CTA was abnormal raising possibility of liver lesion which was further evaluated with MR last week revealing gastric fundal mass and adenopathy.  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 Therefore would be reasonable to proceed with diagnostic esophagogastroduodenoscopy today.  #2.  History of Covid pneumonia.  Patient is not having any respiratory symptoms.  Plan:  We will proceed with diagnostic esophagogastroduodenoscopy later today. Procedure and risk reviewed with the patient and he is agreeable to proceed with the study. Patient will be done towards the end of the day.

## 2019-10-10 NOTE — Anesthesia Postprocedure Evaluation (Signed)
Anesthesia Post Note  Patient: Stephen Wilcox  Procedure(s) Performed: ESOPHAGOGASTRODUODENOSCOPY (EGD) WITH PROPOFOL (N/A ) BIOPSY  Patient location during evaluation: PACU Anesthesia Type: General Level of consciousness: awake and alert, oriented and sedated Pain management: pain level controlled Vital Signs Assessment: post-procedure vital signs reviewed and stable Respiratory status: spontaneous breathing, respiratory function stable and patient connected to nasal cannula oxygen Cardiovascular status: blood pressure returned to baseline Postop Assessment: no apparent nausea or vomiting Anesthetic complications: no   No complications documented.   Last Vitals:  Vitals:   10/10/19 1627 10/10/19 1631  BP:  (!) 101/58  Pulse:  82  Resp:  (!) 29  Temp:    SpO2: 99% 98%    Last Pain:  Vitals:   10/10/19 1631  TempSrc:   PainSc: Asleep                 Aroush Chasse C Lydiann Bonifas

## 2019-10-10 NOTE — TOC Progression Note (Signed)
Transition of Care [facility]) - Progression Note    Patient Details  Name: Stephen Wilcox MRN: [id number] Date of Birth: 21-Mar-1955  [facility] of [facility] CM/SW Contact  Salome Arnt, [place] Phone Number: 10/10/2019, 12:43 PM  Clinical Narrative:  LCSW provided contact information to [facility] for administrator at [facility] to discuss single case agreement. TOC will continue to follow.       Expected Discharge Plan: Skilled Nursing Facility Barriers to Discharge: Insurance Authorization  Expected Discharge Plan and Services Expected Discharge Plan: [place] In-house Referral: Clinical Social Work   Post Acute Care Choice: [place] Living arrangements for the past 2 months: Apartment                                       Social Determinants of Health (SDOH) Interventions    Readmission Risk Interventions No flowsheet data found.

## 2019-10-10 NOTE — Anesthesia Preprocedure Evaluation (Addendum)
Anesthesia Evaluation  Patient identified by MRN, date of birth, ID band Patient awake    Reviewed: Allergy & Precautions, NPO status , Patient's Chart, lab work & pertinent test results  Airway Mallampati: II  TM Distance: >3 FB Neck ROM: Full    Dental  (+) Dental Advisory Given, Teeth Intact   Pulmonary pneumonia (covid positive - 09/24/19, on 2L of O2, ), resolved,  IMPRESSION: 1. No evidence of acute pulmonary artery filling defects. 2. Mixed areas of heterogeneous consolidative and ground-glass opacity throughout both lungs with a basilar and peripheral predominance. Findings are compatible with multifocal pneumonia compatible with a COVID-19 etiology. 3. Indeterminate 4.8 cm lesion in the right lobe liver, incompletely characterized. Additional multiple soft tissue attenuation lesions along the lesser curvature/gastrohepatic ligament, largest measuring up to 4.6 cm in size. These findings are new from comparison abdominal CT 08/15/2018 and could raise concern for potential malignancy. Could consider further evaluation with abdominal MR with contrast. 4. Additional 7 mm hyperdense lesion arising from the upper pole left kidney, could reflect proteinaceous cysts, Ob also be better evaluated on dedicated abdominal imaging. 5. Cholelithiasis. 6. Aberrant right subclavian artery.   On O2 2L, sat 94%  breath sounds clear to auscultation       Cardiovascular Exercise Tolerance: Poor  Rhythm:Regular Rate:Normal  29-Sep-2019 08:18:29 [facility]-AP-ER ROUTINE RECORD Atrial fibrillation Low voltage, extremity leads   Neuro/Psych    GI/Hepatic Liver masses IMPRESSION: 1. 6.5 x 4.8 cm infiltrating gastric mass involving the fundal region. Associated gastrohepatic ligament lymphadenopathy. 2. Numerous hepatic metastatic lesions. 3. Cholelithiasis.   Electronically Signed   By: Marijo Sanes M.D.   On:  10/04/2019 06:26    Endo/Other  negative endocrine ROS  Renal/GU negative Renal ROS     Musculoskeletal   Abdominal   Peds  Hematology  (+) anemia ,   Anesthesia Other Findings IMPRESSION: 1. No evidence of acute pulmonary artery filling defects. 2. Mixed areas of heterogeneous consolidative and ground-glass opacity throughout both lungs with a basilar and peripheral predominance. Findings are compatible with multifocal pneumonia compatible with a COVID-19 etiology. 3. Indeterminate 4.8 cm lesion in the right lobe liver, incompletely characterized. Additional multiple soft tissue attenuation lesions along the lesser curvature/gastrohepatic ligament, largest measuring up to 4.6 cm in size. These findings are new from comparison abdominal CT 08/15/2018 and could raise concern for potential malignancy. Could consider further evaluation with abdominal MR with contrast. 4. Additional 7 mm hyperdense lesion arising from the upper pole left kidney, could reflect proteinaceous cysts, Ob also be better evaluated on dedicated abdominal imaging. 5. Cholelithiasis. 6. Aberrant right subclavian artery.   Reproductive/Obstetrics                           Anesthesia Physical Anesthesia Plan  ASA: IV  Anesthesia Plan: General   Post-op Pain Management:    Induction: Intravenous  PONV Risk Score and Plan: TIVA  Airway Management Planned: Nasal Cannula, Natural Airway and Simple Face Mask  Additional Equipment:   Intra-op Plan:   Post-operative Plan: Possible Post-op intubation/ventilation  Informed Consent: I have reviewed the patients History and Physical, chart, labs and discussed the procedure including the risks, benefits and alternatives for the proposed anesthesia with the patient or authorized representative who has indicated his/her understanding and acceptance.     Dental advisory given  Plan Discussed with: Surgeon  Anesthesia Plan  Comments:       Anesthesia Quick  Evaluation

## 2019-10-10 NOTE — Progress Notes (Signed)
Brief EGD note  Ulcerated mass at distal esophagus extending into the gastric fundus where the bulk of the tumor is.  Fundal masses exophytic and friable.  Biopsies taken Small polyp at gastric body tumors greater curvature.  Biopsy taken. Patchy erythema in antrum without erosions or ulcers. Normal bulbar and post bulbar mucosa.

## 2019-10-10 NOTE — Progress Notes (Signed)
PROGRESS NOTE  Stephen Wilcox:[id number] DOB: 04-28-1955 DOA: 09/28/2019 PCP: Jake Samples, PA-C  Brief History:  64 year old male without any documented significant chronic medical problems presenting to the ED on 09/24/2019 with lightheadedness malaise, and some generalized weakness.  Patient was diagnosed with positive Covid.  He had declined monoclonal antibody infusion.  On 09/28/2019, the patient presented back to the emergency department with worsening generalized weakness, dizziness, and cough and malaise.  He was noted to have oxygen saturation of 82% room air and placed on 2 L nasal cannula.  Was subsequently started on remdesivir and IV steroids.  At the time of admission, the patient was noted to have a hemoglobin of 7.3 which was a significant drop when compared to 09/24/2019 when his hemoglobin was 9.1.  Hemoccult was noted to be positive during admission.  GI was consulted to assist with management.  Dr. Laural Golden saw the patient.  He did not feel that the patient had any overt active GI bleeding.  He recommended PPI infusion for 72 hours and subsequently transitioned to oral Protonix.  He recommended endoscopic evaluation if the patient's hemoglobin continued to drop again requiring transfusion.  The patient was transfused 2 units PRBC.  His hemoglobin went up to 9.3 and has remained stable since then.  The patient's oxygen demand gradually increased to 6 L, but subsequently remained stable.  Blood cultures grew Staph epidermidis in 1 of 2 sets.  This was thought to be a contaminant.  Empiric antibiotics were discontinued.  On 09/29/2019, the patient was noted to have atrial fibrillation.  EKG confirmed atrial fibrillation.  The patient was started on oral diltiazem.  Echocardiogram on 09/30/2019 showed EF 55 to 60%, no WMA.  Assessment/Plan: Acute respiratory failure with hypoxia secondary to COVID-19 pneumonia -sepsis ruled out -Patient was weaned off of oxygen and is  currently on 2L -Oxygen saturation 90-92% -Currently on steroids, day 12.  Will discontinue further steroids -Continue vitamin C and zinc -CRP 19.4>> 15.8>> 7.9>> 3.6>> 1.7>> 1.0>>0.8 -Ferritin 59>> 70>> 95>> 90>> 67>> 53>>50 -D-dimer 1.89>> 1.48>> 1.26>> 1.13>> 1.31>> 1.48>>1.71 -finished 5 days remdesivir 10/03/19 -10/03/19 CTA chest--no PE; 4.8 cm lesion in the right lobe liver, incompletely characterized. Additional multiple soft tissue attenuation lesions along the lesser curvature/gastrohepatic ligament, largest measuring up to 4.6 cm in size  Gastric and Hepatic Masses -incidental finding on CTA chest -10/03/19 MR Liver--6.5 x 4.8 cm infiltrating gastric mass involving the fundus and numerous liver masses -GI consult--> -Patient underwent EGD with biopsy on 9/20 -Plans for oncology consultation as biopsy results become available -May need PEG tube for adequate nutrition since he has significant odynophagia -patient and daughter updated  Symptomatic anemia/heme positive stool -Appreciate GI consult -Dr. Laural Golden followed the patient--> recommended IV Protonix then transition to p.o. Protonix -Transfused 2 units PRBC -Hemoglobin has been trending down, continue to follow -in retrospect, gastric mass may have been source of bleed -Dr. Laural Golden reevaluated the patient and he underwent endoscopy on 9/20 -Biopsies obtained from gastric mass  New onset atrial fibrillation -Currently rate controlled -Transition to long-acting diltiazem-->increase to 180 mg -Continue metoprolol -09/30/2019 echo EF 55 to 60%, no WMA -Start aspirin 81 mg daily -CHA2DS2-VASc = 1 -TSH--0.733 -We will hold anticoagulation/aspirin in light of high risk for bleeding from gastric lesion.  Diabetes mellitus type 2 -Elevated CBGs secondary to steroids -09/28/2019 hemoglobin A1c 6.9 -He has been having episodes of hypoglycemia -Discontinue basal and premeal insulin since  steroids are discontinued -Continue on  sliding scale  Microcytic anemia/Iron deficiency -iron saturation 3% -ferritin low despite COVID-19 -iron supplement when recovered from COVID-19  Thrombocytosis -due to iron deficiency and acute medical illness  Class II obesity -BMI 32.14 -Lifestyle modification  BPH -Continue tamsulosin  Bacteremia -Staph epidermidis 1 out of 2 sets -Represents contaminant  Generalized weakness -PT evaluation--> skilled nursing facility with which the patient and daughter agree  Status is: Inpatient  Remains inpatient appropriate because:IV treatments appropriate due to intensity of illness or inability to take PO   Dispo: The patient is from: Home  Anticipated d/c is to: SNF  Anticipated d/c date is: 1 day  Patient currently is medically stable to d/c.   Family Communication:   Daughter updated 9/20  Consultants:  GI--Rehman  Code Status:  FULL   DVT Prophylaxis:  SCDs   Procedures: As Listed in Progress Note Above  Antibiotics: None     Subjective: Describes pain/pressure with any sort of p.o. intake.  No vomiting.  Stools have been dark.  Objective: Vitals:   10/10/19 1627 10/10/19 1631 10/10/19 1645 10/10/19 1732  BP:  (!) 101/58 (!) 109/95 108/67  Pulse:  82 80 68  Resp:  (!) 29 14 16   Temp:    98 F (36.7 C)  TempSrc:    Oral  SpO2: 99% 98% 95% 98%  Weight:      Height:        Intake/Output Summary (Last 24 hours) at 10/10/2019 1825 Last data filed at 10/10/2019 1632 Gross per 24 hour  Intake 635.88 ml  Output 300 ml  Net 335.88 ml   Weight change:  Exam:  General exam: Alert, awake, oriented x 3 Respiratory system: Clear to auscultation. Respiratory effort normal. Cardiovascular system:RRR. No murmurs, rubs, gallops. Gastrointestinal system: Abdomen is nondistended, soft and nontender. No organomegaly or masses felt. Normal bowel sounds heard. Central nervous system: Alert and  oriented. No focal neurological deficits. Extremities: No C/C/E, +pedal pulses Skin: No rashes, lesions or ulcers Psychiatry: Judgement and insight appear normal. Mood & affect appropriate.    Data Reviewed: I have personally reviewed following labs and imaging studies Basic Metabolic Panel: Recent Labs  Lab 10/04/19 0650 10/05/19 0629 10/06/19 0716 10/07/19 0845  NA 136 137 135 137  K 4.3 4.5 4.4 4.4  CL 97* 97* 98 101  CO2 31 31 32 28  GLUCOSE 234* 204* 186* 110*  BUN 36* 35* 33* 30*  CREATININE 0.86 0.85 0.85 0.73  CALCIUM 8.1* 7.9* 7.7* 7.5*   Liver Function Tests: Recent Labs  Lab 10/04/19 0650 10/05/19 0629 10/06/19 0716 10/07/19 0845  AST 36 28 29 26   ALT 42 38 36 35  ALKPHOS 46 49 49 52  BILITOT 0.6 0.8 0.6 0.7  PROT 5.4* 5.4* 5.1* 5.0*  ALBUMIN 2.4* 2.4* 2.2* 2.3*   No results for input(s): LIPASE, AMYLASE in the last 168 hours. No results for input(s): AMMONIA in the last 168 hours. Coagulation Profile: No results for input(s): INR, PROTIME in the last 168 hours. CBC: Recent Labs  Lab 10/04/19 0650 10/05/19 0629 10/06/19 0716 10/07/19 0845 10/10/19 0733  WBC 18.1* 13.6* 13.0* 15.2* 13.9*  NEUTROABS 16.8* 12.7* 11.9* 13.8*  --   HGB 9.5* 8.7* 8.4* 8.7* 7.7*  HCT 31.8* 30.0* 28.4* 30.6* 26.9*  MCV 78.5* 78.5* 78.7* 80.7 80.3  PLT 463* 377 329 311 266   Cardiac Enzymes: No results for input(s): CKTOTAL, CKMB, CKMBINDEX, TROPONINI in the last 168 hours. BNP:  Invalid input(s): POCBNP CBG: Recent Labs  Lab 10/09/19 1631 10/09/19 2126 10/10/19 0801 10/10/19 1135 10/10/19 1729  GLUCAP 269* 276* 67* 121* 110*   HbA1C: No results for input(s): HGBA1C in the last 72 hours. Urine analysis:    Component Value Date/Time   COLORURINE YELLOW 08/15/2018 1009   APPEARANCEUR CLEAR 08/15/2018 1009   LABSPEC 1.013 08/15/2018 1009   PHURINE 6.0 08/15/2018 1009   GLUCOSEU NEGATIVE 08/15/2018 1009   HGBUR MODERATE (A) 08/15/2018 1009   BILIRUBINUR  NEGATIVE 08/15/2018 1009   [place] 08/15/2018 1009   PROTEINUR NEGATIVE 08/15/2018 1009   NITRITE NEGATIVE 08/15/2018 1009   LEUKOCYTESUR NEGATIVE 08/15/2018 1009   Sepsis Labs: @LABRCNTIP (procalcitonin:4,lacticidven:4) ) No results found for this or any previous visit (from the past 240 hour(s)).   Scheduled Meds: . sodium chloride   Intravenous Once  . albuterol  2 puff Inhalation TID  . vitamin C  500 mg Oral Daily  . diltiazem  120 mg Oral Daily  . hyoscyamine  0.25 mg Sublingual TID AC  . insulin aspart  0-20 Units Subcutaneous TID WC  . insulin aspart  0-5 Units Subcutaneous QHS  . insulin glargine  15 Units Subcutaneous QHS  . metoprolol tartrate  25 mg Oral TID  . pantoprazole  40 mg Oral BID AC  . predniSONE  40 mg Oral Q breakfast  . tamsulosin  0.4 mg Oral QPC supper  . zinc sulfate  220 mg Oral Daily   Continuous Infusions:  Procedures/Studies: DG Chest 2 View  Result Date: 09/15/2019 CLINICAL DATA:  Acute chest pain. EXAM: CHEST - 2 VIEW COMPARISON:  None. FINDINGS: The heart size and mediastinal contours are within normal limits. No pneumothorax or pleural effusion is noted. Right lung is clear. Minimal left basilar subsegmental atelectasis or scarring is noted. The visualized skeletal structures are unremarkable. IMPRESSION: Minimal left basilar subsegmental atelectasis or scarring. Electronically Signed   By: Marijo Conception M.D.   On: 09/15/2019 16:58   DG Ribs Unilateral Right  Result Date: 09/15/2019 CLINICAL DATA:  Acute right chest pain. EXAM: RIGHT RIBS - 2 VIEW COMPARISON:  None. FINDINGS: No fracture or other bone lesions are seen involving the ribs. IMPRESSION: Negative. Electronically Signed   By: Marijo Conception M.D.   On: 09/15/2019 16:57   CT ANGIO CHEST PE W OR WO CONTRAST  Result Date: 10/03/2019 CLINICAL DATA:  COVID-19 positive, weakness, dizziness and shortness of breath EXAM: CT ANGIOGRAPHY CHEST WITH CONTRAST TECHNIQUE:  Multidetector CT imaging of the chest was performed using the standard protocol during bolus administration of intravenous contrast. Multiplanar CT image reconstructions and MIPs were obtained to evaluate the vascular anatomy. CONTRAST:  136mL OMNIPAQUE IOHEXOL 350 MG/ML SOLN COMPARISON:  Radiograph 09/28/2019, CT abdomen pelvis 08/15/2018 FINDINGS: Cardiovascular: Satisfactory opacification the pulmonary arteries to the segmental level. No pulmonary artery filling defects are identified. Central pulmonary arteries are normal caliber. Suboptimal opacification of the thoracic aorta. No discernible acute abnormality of the thoracic aorta proximal great vessels. Aberrant right subclavian artery. Borderline cardiac enlargement. Slight reflux of contrast into the IVC. Mediastinum/Nodes: No mediastinal fluid or gas. Normal thyroid gland and thoracic inlet. No acute abnormality of the trachea or esophagus. No worrisome mediastinal, hilar or axillary adenopathy. Lungs/Pleura: Mixed areas of heterogeneous consolidative and ground-glass opacity throughout both lungs with a basilar and peripheral predominance. No pneumothorax or visible effusion. Diffuse airways thickening. Upper Abdomen: Indeterminate intermediate attenuation (38 HU) lesion measuring up to 8.5 cm in maximum transaxial dimension  within the right lobe liver (4/80). Cholelithiasis with a partially calcified gallstone towards the gallbladder neck. Multiple soft tissue attenuation lesions present along the lesser curvature/gastrohepatic ligament. Largest measuring up to 4.6 cm in size (4/91). Partially exophytic 0.7 cm lesion arising from the upper pole left kidney, corresponding with a previously seen fluid attenuation cyst on prior comparison CT, could reflect intracystic hemorrhage/proteinaceous debris. Musculoskeletal: Multilevel degenerative changes are present in the imaged portions of the spine. No acute osseous abnormality or suspicious osseous lesion.  Probable intramuscular lipoma of the right infraspinatus measuring up to 6.9 by 3.5 cm (4/20). No other acute or worrisome chest wall lesions. Review of the MIP images confirms the above findings. IMPRESSION: 1. No evidence of acute pulmonary artery filling defects. 2. Mixed areas of heterogeneous consolidative and ground-glass opacity throughout both lungs with a basilar and peripheral predominance. Findings are compatible with multifocal pneumonia compatible with a COVID-19 etiology. 3. Indeterminate 4.8 cm lesion in the right lobe liver, incompletely characterized. Additional multiple soft tissue attenuation lesions along the lesser curvature/gastrohepatic ligament, largest measuring up to 4.6 cm in size. These findings are new from comparison abdominal CT 08/15/2018 and could raise concern for potential malignancy. Could consider further evaluation with abdominal MR with contrast. 4. Additional 7 mm hyperdense lesion arising from the upper pole left kidney, could reflect proteinaceous cysts, Ob also be better evaluated on dedicated abdominal imaging. 5. Cholelithiasis. 6. Aberrant right subclavian artery. These results will be called to the ordering clinician or representative by the Radiologist Assistant, and communication documented in the PACS or [employer]. Electronically Signed   By: Lovena Le M.D.   On: 10/03/2019 15:38   MR LIVER W WO CONTRAST  Result Date: 10/04/2019 CLINICAL DATA:  Evaluate liver lesions and upper abdominal adenopathy seen on recent chest CT. EXAM: MRI ABDOMEN WITHOUT AND WITH CONTRAST TECHNIQUE: Multiplanar multisequence MR imaging of the abdomen was performed both before and after the administration of intravenous contrast. CONTRAST:  27mL GADAVIST GADOBUTROL 1 MMOL/ML IV SOLN COMPARISON:  CT abdomen/pelvis 08/15/2018 FINDINGS: Lower chest: The lungs demonstrate changes of COVID pneumonia as demonstrated on today's chest CT. Hepatobiliary: Numerous diffusion positive  hepatic metastatic lesions are noted. Segment 4A lesion measures 3.7 cm on image 7/8. 5 cm lesion and segment 7 on image 12/8. 5 cm lesion in segment 6 on image 18/8. Several other smaller lesions are noted. No intrahepatic biliary dilatation. There is diffuse fatty infiltration of the liver noted. A 2 cm gallstone is noted in the gallbladder. No common bile duct dilatation. Pancreas:  No mass, inflammation or ductal dilatation. Spleen:  Normal size. No focal lesions. Adrenals/Urinary Tract: The adrenal glands and kidneys are unremarkable. Stomach/Bowel: There is a large infiltrating gastric mass involving the fundal region 6.5 x 4.8 cm and is diffusion positive. Moderate contrast enhancement is noted. Adjacent gastrohepatic ligament lymphadenopathy with the largest node measuring 4.6 cm. Vascular/Lymphatic: The aorta is normal in caliber. No dissection. The branch vessels are patent. No retroperitoneal lymphadenopathy. Other:  No ascites or abdominal wall hernia. Musculoskeletal: No worrisome bone lesions. IMPRESSION: 1. 6.5 x 4.8 cm infiltrating gastric mass involving the fundal region. Associated gastrohepatic ligament lymphadenopathy. 2. Numerous hepatic metastatic lesions. 3. Cholelithiasis. Electronically Signed   By: Marijo Sanes M.D.   On: 10/04/2019 06:26   DG Chest Port 1 View  Result Date: 09/28/2019 CLINICAL DATA:  COVID EXAM: PORTABLE CHEST 1 VIEW COMPARISON:  09/24/2019 FINDINGS: Development of patchy bilateral airspace opacities. No pleural effusion. Stable cardiomediastinal  silhouette. No pneumothorax. IMPRESSION: Development of patchy bilateral airspace opacities, consistent with bilateral pneumonia. Electronically Signed   By: Donavan Foil M.D.   On: 09/28/2019 23:14   DG Chest Port 1 View  Result Date: 09/24/2019 CLINICAL DATA:  COVID pneumonia, weakness, fatigue EXAM: PORTABLE CHEST 1 VIEW COMPARISON:  09/15/2019 FINDINGS: The lungs are symmetrically expanded. Minimal left basilar  atelectasis is again noted. No superimposed confluent pulmonary infiltrate. No pneumothorax or pleural effusion. Cardiac size within normal limits. The pulmonary vascularity is normal. No acute bone abnormality. IMPRESSION: Minimal left basilar atelectasis, unchanged. No superimposed confluent pulmonary infiltrate. Electronically Signed   By: Fidela Salisbury MD   On: 09/24/2019 20:15   ECHOCARDIOGRAM COMPLETE  Result Date: 09/30/2019    ECHOCARDIOGRAM REPORT   Patient Name:   Stephen Wilcox Date of Exam: 09/30/2019 Medical Rec #:  [id number]         Height:       72.0 in Accession #:    [id number]        Weight:       237.0 lb Date of Birth:  04-08-1955        BSA:          2.290 m Patient Age:    89 years          BP:           95/60 mmHg Patient Gender: M                 HR:           126 bpm. Exam Location:  [facility] Procedure: 2D Echo Indications:    Endocarditis I38  History:        Patient has no prior history of Echocardiogram examinations.                 Risk Factors:Non-Smoker. Pneumonia due to COVID-19 virus,                 Sepsis, Lactic Acidosis.  Sonographer:    Leavy Cella RDCS (AE) Referring Phys: [id number] OLADAPO ADEFESO IMPRESSIONS  1. Left ventricular ejection fraction, by estimation, is 55 to 60%. The left ventricle has normal function. The left ventricle has no regional wall motion abnormalities. There is moderate left ventricular hypertrophy. Left ventricular diastolic parameters are indeterminate.  2. Right ventricular systolic function is normal. The right ventricular size is normal. Tricuspid regurgitation signal is inadequate for assessing PA pressure.  3. The mitral valve is grossly normal. No evidence of mitral valve regurgitation.  4. The aortic valve is tricuspid. Aortic valve regurgitation is not visualized.  5. The inferior vena cava is normal in size with <50% respiratory variability, suggesting right atrial pressure of 8 mmHg.  6. Views are somewhat limited, but no  definite valvular vegetations are visualized. FINDINGS  Left Ventricle: Left ventricular ejection fraction, by estimation, is 55 to 60%. The left ventricle has normal function. The left ventricle has no regional wall motion abnormalities. The left ventricular internal cavity size was normal in size. There is  moderate left ventricular hypertrophy. Left ventricular diastolic parameters are indeterminate. Right Ventricle: The right ventricular size is normal. No increase in right ventricular wall thickness. Right ventricular systolic function is normal. Tricuspid regurgitation signal is inadequate for assessing PA pressure. Left Atrium: Left atrial size was normal in size. Right Atrium: Right atrial size was normal in size. Pericardium: There is no evidence of pericardial effusion. Mitral Valve: The mitral valve is grossly normal.  No evidence of mitral valve regurgitation. Tricuspid Valve: The tricuspid valve is grossly normal. Tricuspid valve regurgitation is trivial. Aortic Valve: The aortic valve is tricuspid. Aortic valve regurgitation is not visualized. Pulmonic Valve: The pulmonic valve was grossly normal. Pulmonic valve regurgitation is trivial. Aorta: The aortic root is normal in size and structure. Venous: The inferior vena cava is normal in size with less than 50% respiratory variability, suggesting right atrial pressure of 8 mmHg. IAS/Shunts: No atrial level shunt detected by color flow Doppler.  LEFT VENTRICLE PLAX 2D LVIDd:         4.37 cm Diastology LVIDs:         2.20 cm LV e' medial:    7.18 cm/s LV PW:         1.72 cm LV E/e' medial:  9.2 LV IVS:        1.46 cm LV e' lateral:   12.90 cm/s                        LV E/e' lateral: 5.1  RIGHT VENTRICLE RV S prime:     16.00 cm/s TAPSE (M-mode): 1.5 cm LEFT ATRIUM             Index       RIGHT ATRIUM           Index LA diam:        3.70 cm 1.62 cm/m  RA Area:     15.40 cm LA Vol (A2C):   63.9 ml 27.90 ml/m RA Volume:   39.80 ml  17.38 ml/m LA Vol  (A4C):   40.6 ml 17.73 ml/m LA Biplane Vol: 52.4 ml 22.88 ml/m   AORTA Ao Root diam: 3.50 cm MITRAL VALVE MV Area (PHT): 6.60 cm MV Decel Time: 115 msec MV E velocity: 66.00 cm/s MV A velocity: 39.80 cm/s MV E/A ratio:  1.66 Rozann Lesches MD Electronically signed by Rozann Lesches MD Signature Date/Time: 09/30/2019/4:58:16 PM    Final     Kathie Dike, MD  [facility]  If 7PM-7AM, please contact night-coverage [email or web address]  10/10/2019, 6:25 PM   LOS: 12 days

## 2019-10-10 NOTE — Op Note (Signed)
[facility] Patient Name: Stephen Wilcox Procedure Date: 10/10/2019 3:14 PM MRN: [id number] Date of Birth: September 15, 1955 Attending MD: Hildred Laser , MD CSN: [id number] Age: 64 Admit Type: Inpatient Procedure:                Upper GI endoscopy Indications:              Iron deficiency anemia secondary to chronic blood                            loss, Abnormal MRI of the GI tract; gastric fundal                            mass. Providers:                Hildred Laser, MD, Rosina Lowenstein, RN, Randa Spike, Technician Referring MD:             Kathie Dike, MS Medicines:                Propofol per Anesthesia Complications:            No immediate complications. Estimated Blood Loss:     Estimated blood loss was minimal. Procedure:                Pre-Anesthesia Assessment:                           - Prior to the procedure, a History and Physical                            was performed, and patient medications and                            allergies were reviewed. The patient's tolerance of                            previous anesthesia was also reviewed. The risks                            and benefits of the procedure and the sedation                            options and risks were discussed with the patient.                            All questions were answered, and informed consent                            was obtained. Prior Anticoagulants: The patient has                            taken no previous anticoagulant or antiplatelet  agents. ASA Grade Assessment: III - A patient with                            severe systemic disease. After reviewing the risks                            and benefits, the patient was deemed in                            satisfactory condition to undergo the procedure.                           After obtaining informed consent, the endoscope was                            passed under  direct vision. Throughout the                            procedure, the patient's blood pressure, pulse, and                            oxygen saturations were monitored continuously. The                            GIF-H190 ([id number]) scope was introduced through the                            mouth, and advanced to the second part of duodenum.                            The upper GI endoscopy was accomplished without                            difficulty. The patient tolerated the procedure                            well. Scope In: 4:12:41 PM Scope Out: 4:25:14 PM Total Procedure Duration: 0 hours 12 minutes 33 seconds  Findings:      The hypopharynx was normal.      The upper third of the esophagus and middle third of the esophagus were       normal.      A small, fungating and ulcerating mass with no bleeding and no stigmata       of recent bleeding was found in the distal esophagus, 37 cm from the       incisors. The mass was non-obstructing and not circumferential.      A large, fungating, polypoid and ulcerated, non-circumferential mass       with no bleeding and no stigmata of recent bleeding was found in the       cardia. Biopsies were taken with a cold forceps for histology. The       pathology specimen was placed into Bottle Number 2.      A single 8 mm pedunculated polyp with no bleeding and no stigmata of       recent  bleeding was found in the gastric body and on the greater       curvature of the gastric body. Biopsies were taken with a cold forceps       for histology. The pathology specimen was placed into Bottle Number 1.      Patchy mildly erythematous mucosa without bleeding was found in the       gastric antrum.      The duodenal bulb and second portion of the duodenum were normal. Impression:               - Normal hypopharynx.                           - Normal upper third of esophagus and middle third                            of esophagus.                            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                           - A single gastric polyp. Biopsied.                           - Erythematous mucosa in the antrum.                           - Normal duodenal bulb and second portion of the                            duodenum. Moderate Sedation:      Per Anesthesia Care Recommendation:           - Return patient to hospital ward for ongoing care.                           - Full liquid diet today.                           - Continue present medications.                           - Await pathology results.                           - Oncology consultation. 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 All rights reserved. The codes documented in this report are preliminary and upon coder review may  be revised to meet current compliance requirements. Hildred Laser, MD Hildred Laser, MD 10/10/2019 5:13:10 PM This report has been signed  electronically. Number of Addenda: 0

## 2019-10-10 NOTE — Addendum Note (Signed)
Addendum  created 10/10/19 1710 by Denese Killings, MD   Order list changed

## 2019-10-11 ENCOUNTER — Inpatient Hospital Stay (HOSPITAL_COMMUNITY): Payer: 59

## 2019-10-11 DIAGNOSIS — C160 Malignant neoplasm of cardia: Secondary | ICD-10-CM

## 2019-10-11 DIAGNOSIS — C155 Malignant neoplasm of lower third of esophagus: Secondary | ICD-10-CM

## 2019-10-11 DIAGNOSIS — D500 Iron deficiency anemia secondary to blood loss (chronic): Secondary | ICD-10-CM

## 2019-10-11 LAB — CBC
HCT: 26 % — ABNORMAL LOW (ref 39.0–52.0)
Hemoglobin: 7.6 g/dL — ABNORMAL LOW (ref 13.0–17.0)
MCH: 23.6 pg — ABNORMAL LOW (ref 26.0–34.0)
MCHC: 29.2 g/dL — ABNORMAL LOW (ref 30.0–36.0)
MCV: 80.7 fL (ref 80.0–100.0)
Platelets: 241 10*3/uL (ref 150–400)
RBC: 3.22 MIL/uL — ABNORMAL LOW (ref 4.22–5.81)
RDW: 20.6 % — ABNORMAL HIGH (ref 11.5–15.5)
WBC: 13.6 10*3/uL — ABNORMAL HIGH (ref 4.0–10.5)
nRBC: 0 % (ref 0.0–0.2)

## 2019-10-11 LAB — BASIC METABOLIC PANEL
Anion gap: 5 (ref 5–15)
BUN: 22 mg/dL (ref 8–23)
CO2: 26 mmol/L (ref 22–32)
Calcium: 7.4 mg/dL — ABNORMAL LOW (ref 8.9–10.3)
Chloride: 103 mmol/L (ref 98–111)
Creatinine, Ser: 0.64 mg/dL (ref 0.61–1.24)
GFR calc Af Amer: >60 mL/min (ref >60)
GFR calc non Af Amer: >60 mL/min (ref >60)
Glucose, Bld: 72 mg/dL (ref 70–99)
Potassium: 4.3 mmol/L (ref 3.5–5.1)
Sodium: 134 mmol/L — ABNORMAL LOW (ref 135–145)

## 2019-10-11 LAB — GLUCOSE, CAPILLARY
Glucose-Capillary: 72 mg/dL (ref 70–99)
Glucose-Capillary: 125 mg/dL — ABNORMAL HIGH (ref 70–99)
Glucose-Capillary: 120 mg/dL — ABNORMAL HIGH (ref 70–99)
Glucose-Capillary: 128 mg/dL — ABNORMAL HIGH (ref 70–99)

## 2019-10-11 LAB — SURGICAL PATHOLOGY

## 2019-10-11 MED ORDER — HYDROCODONE-ACETAMINOPHEN 5-325 MG PO TABS
1.0000 | ORAL_TABLET | Freq: Four times a day (QID) | ORAL | Status: DC | PRN
  Administered 2019-10-11 – 2019-10-12 (×2): 1 via ORAL
  Filled 2019-10-11 (×2): qty 1

## 2019-10-11 MED ORDER — IOHEXOL 9 MG/ML PO SOLN
ORAL | Status: AC
  Administered 2019-10-11: 500 mL
  Filled 2019-10-11: qty 1000

## 2019-10-11 MED ORDER — IOHEXOL 300 MG/ML  SOLN
75.0000 mL | Freq: Once | INTRAMUSCULAR | Status: AC | PRN
  Administered 2019-10-11: 75 mL via INTRAVENOUS

## 2019-10-11 NOTE — Plan of Care (Signed)

## 2019-10-11 NOTE — Progress Notes (Signed)
PROGRESS NOTE  Stephen Wilcox MEQ:[id number] DOB: 12/28/1955 DOA: 09/28/2019 PCP: Jake Samples, PA-C  Brief History:  64 year old male without any documented significant chronic medical problems presenting to the ED on 09/24/2019 with lightheadedness malaise, and some generalized weakness.  Patient was diagnosed with positive Covid.  He had declined monoclonal antibody infusion.  On 09/28/2019, the patient presented back to the emergency department with worsening generalized weakness, dizziness, and cough and malaise.  He was noted to have oxygen saturation of 82% room air and placed on 2 L nasal cannula.  Was subsequently started on remdesivir and IV steroids.  At the time of admission, the patient was noted to have a hemoglobin of 7.3 which was a significant drop when compared to 09/24/2019 when his hemoglobin was 9.1.  Hemoccult was noted to be positive during admission.  GI was consulted to assist with management.  Dr. Laural Golden saw the patient.  He did not feel that the patient had any overt active GI bleeding.  He recommended PPI infusion for 72 hours and subsequently transitioned to oral Protonix.  He recommended endoscopic evaluation if the patient's hemoglobin continued to drop again requiring transfusion.  The patient was transfused 2 units PRBC.  His hemoglobin went up to 9.3.  The patient's oxygen demand gradually increased to 6 L, but subsequently remained stable.  Blood cultures grew Staph epidermidis in 1 of 2 sets.  This was thought to be a contaminant.  Empiric antibiotics were discontinued.  On 09/29/2019, the patient was noted to have atrial fibrillation.  EKG confirmed atrial fibrillation.  The patient was started on oral diltiazem.  Echocardiogram on 09/30/2019 showed EF 55 to 60%, no WMA.  Assessment/Plan: Acute respiratory failure with hypoxia secondary to COVID-19 pneumonia -sepsis ruled out -Patient was weaned off of oxygen and is currently on 2L -Oxygen saturation  90-92% -He has completed his course of steroids -Continue vitamin C and zinc -CRP 19.4>> 15.8>> 7.9>> 3.6>> 1.7>> 1.0>>0.8 -Ferritin 59>> 70>> 95>> 90>> 67>> 53>>50 -D-dimer 1.89>> 1.48>> 1.26>> 1.13>> 1.31>> 1.48>>1.71 -finished 5 days remdesivir 10/03/19 -10/03/19 CTA chest--no PE; 4.8 cm lesion in the right lobe liver, incompletely characterized. 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 Delton Coombes who will follow patient up as an outpatient -Requested that CT abdomen and pelvis with contrast be performed for to complete staging process -Also requested possible port placement by general surgery for chemotherapy -Discussed with Dr. Arnoldo Morale who will see in a.m. -Regarding nutrition, will as dietitian to perform calorie count -If patient is unable to maintain 1800 -calorie/day intake on full liquid diet, may need to consider PEG tube placement to supplement nutrition  Symptomatic anemia/heme positive stool -Appreciate GI consult -Dr. Laural Golden followed the patient--> recommended IV Protonix then transition to p.o. Protonix -Transfused 2 units PRBC -Hemoglobin has been trending down, continue to follow -in retrospect, gastric mass may have been source of bleed -Transfuse for hemoglobin less than 7  New onset atrial fibrillation -Currently rate controlled -Transition to long-acting diltiazem-->increase to 180 mg -Continue metoprolol -09/30/2019 echo EF  55 to 60%, no WMA -Start aspirin 81 mg daily -CHA2DS2-VASc =  1 -TSH--0.733 -We will hold anticoagulation/aspirin in light of high risk for bleeding from gastric lesion.  Diabetes mellitus type 2 -09/28/2019 hemoglobin A1c 6.9 -Initially having episodes of hyperglycemia due to steroids -Blood sugars have trended down since steroids have been discontinued -Basal insulin/Premeal insulin also discontinued since he was having episodes of hypoglycemia  Microcytic anemia/Iron deficiency -iron saturation 3% -ferritin low despite COVID-19 -iron supplement when recovered from COVID-19  Thrombocytosis -due to iron deficiency and acute medical illness  Class II obesity -BMI 32.14 -Lifestyle modification  BPH -Continue tamsulosin  Bacteremia -Staph epidermidis 1 out of 2 sets -Represents contaminant  Generalized weakness -PT evaluation--> skilled nursing facility with which the patient and daughter agree  Status is: Inpatient  Remains inpatient appropriate because:IV treatments appropriate due to intensity of illness or inability to take PO   Dispo: The patient is from: Home  Anticipated d/c is to: SNF  Anticipated d/c date is: 2 day  Patient currently is not medically stable to d/c.   Family Communication:   Daughter updated 9/21  Consultants:  GI--Rehman  Code Status:  FULL   DVT Prophylaxis:  SCDs   Procedures: As Listed in Progress Note Above  Antibiotics: None     Subjective: Reports that he tolerated full liquids better today.  They cause less pressure in his abdomen.  No vomiting.  Objective: Vitals:   10/10/19 1732 10/10/19 2007 10/10/19 2135 10/11/19 1456  BP: 108/67  117/67 103/63  Pulse: 68  65 74  Resp: 16  19 16   Temp: 98 F (36.7 C)  98.1 F (36.7 C) 98.5 F (36.9 C)  TempSrc: Oral   Oral  SpO2: 98% 94% 95% 96%  Weight:      Height:        Intake/Output Summary (Last 24 hours) at 10/11/2019 1931 Last data filed at 10/11/2019 1100 Gross per  24 hour  Intake --  Output 700 ml  Net -700 ml   Weight change:  Exam:  General exam: Alert, awake, oriented x 3 Respiratory system: Clear to auscultation. Respiratory effort normal. Cardiovascular system:RRR. No murmurs, rubs, gallops. Gastrointestinal system: Abdomen is nondistended, soft and nontender. No organomegaly or masses felt. Normal bowel sounds heard. Central nervous system: Alert and oriented. No focal neurological deficits. Extremities: No C/C/E, +pedal pulses Skin: No rashes, lesions or ulcers Psychiatry: Judgement and insight appear normal. Mood & affect appropriate.    Data Reviewed: I have personally reviewed following labs and imaging studies Basic Metabolic Panel: Recent Labs  Lab 10/05/19 0629 10/06/19 0716 10/07/19 0845 10/11/19 0653  NA 137 135 137 134*  K 4.5 4.4 4.4 4.3  CL 97* 98 101 103  CO2 31 32 28 26  GLUCOSE 204* 186* 110* 72  BUN 35* 33* 30* 22  CREATININE 0.85 0.85 0.73 0.64  CALCIUM 7.9* 7.7* 7.5* 7.4*   Liver Function Tests: Recent Labs  Lab 10/05/19 0629 10/06/19 0716 10/07/19 0845  AST 28 29 26   ALT 38 36 35  ALKPHOS 49 49 52  BILITOT 0.8 0.6 0.7  PROT 5.4* 5.1* 5.0*  ALBUMIN 2.4* 2.2* 2.3*   No results for input(s): LIPASE, AMYLASE in the last 168 hours. No results for input(s): AMMONIA in the last 168 hours. Coagulation Profile: No results for input(s): INR, PROTIME in the last 168 hours. CBC: Recent Labs  Lab 10/05/19 0629 10/06/19 0716 10/07/19 0845 10/10/19 0733 10/11/19 0653  WBC 13.6* 13.0*  15.2* 13.9* 13.6*  NEUTROABS 12.7* 11.9* 13.8*  --   --   HGB 8.7* 8.4* 8.7* 7.7* 7.6*  HCT 30.0* 28.4* 30.6* 26.9* 26.0*  MCV 78.5* 78.7* 80.7 80.3 80.7  PLT 377 329 311 266 241   Cardiac Enzymes: No results for input(s): CKTOTAL, CKMB, CKMBINDEX, TROPONINI in the last 168 hours. BNP: Invalid input(s): POCBNP CBG: Recent Labs  Lab 10/10/19 1729 10/10/19 2155 10/11/19 0814 10/11/19 1146 10/11/19 1634  GLUCAP  110* 128* 72 125* 120*   HbA1C: No results for input(s): HGBA1C in the last 72 hours. Urine analysis:    Component Value Date/Time   COLORURINE YELLOW 08/15/2018 1009   APPEARANCEUR CLEAR 08/15/2018 1009   LABSPEC 1.013 08/15/2018 1009   PHURINE 6.0 08/15/2018 1009   GLUCOSEU NEGATIVE 08/15/2018 1009   HGBUR MODERATE (A) 08/15/2018 1009   BILIRUBINUR NEGATIVE 08/15/2018 1009   [place] 08/15/2018 1009   PROTEINUR NEGATIVE 08/15/2018 1009   NITRITE NEGATIVE 08/15/2018 1009   LEUKOCYTESUR NEGATIVE 08/15/2018 1009   Sepsis Labs: @LABRCNTIP (procalcitonin:4,lacticidven:4) ) No results found for this or any previous visit (from the past 240 hour(s)).   Scheduled Meds: . sodium chloride   Intravenous Once  . vitamin C  500 mg Oral Daily  . diltiazem  120 mg Oral Daily  . hyoscyamine  0.25 mg Sublingual TID AC  . insulin aspart  0-20 Units Subcutaneous TID WC  . insulin aspart  0-5 Units Subcutaneous QHS  . metoprolol tartrate  25 mg Oral TID  . pantoprazole  40 mg Oral BID AC  . tamsulosin  0.4 mg Oral QPC supper  . zinc sulfate  220 mg Oral Daily   Continuous Infusions:  Procedures/Studies: DG Chest 2 View  Result Date: 09/15/2019 CLINICAL DATA:  Acute chest pain. EXAM: CHEST - 2 VIEW COMPARISON:  None. FINDINGS: The heart size and mediastinal contours are within normal limits. No pneumothorax or pleural effusion is noted. Right lung is clear. Minimal left basilar subsegmental atelectasis or scarring is noted. The visualized skeletal structures are unremarkable. IMPRESSION: Minimal left basilar subsegmental atelectasis or scarring. Electronically Signed   By: Marijo Conception M.D.   On: 09/15/2019 16:58   DG Ribs Unilateral Right  Result Date: 09/15/2019 CLINICAL DATA:  Acute right chest pain. EXAM: RIGHT RIBS - 2 VIEW COMPARISON:  None. FINDINGS: No fracture or other bone lesions are seen involving the ribs. IMPRESSION: Negative. Electronically Signed   By: Marijo Conception M.D.   On: 09/15/2019 16:57   CT ANGIO CHEST PE W OR WO CONTRAST  Result Date: 10/03/2019 CLINICAL DATA:  COVID-19 positive, weakness, dizziness and shortness of breath EXAM: CT ANGIOGRAPHY CHEST WITH CONTRAST TECHNIQUE: Multidetector CT imaging of the chest was performed using the standard protocol during bolus administration of intravenous contrast. Multiplanar CT image reconstructions and MIPs were obtained to evaluate the vascular anatomy. CONTRAST:  124mL OMNIPAQUE IOHEXOL 350 MG/ML SOLN COMPARISON:  Radiograph 09/28/2019, CT abdomen pelvis 08/15/2018 FINDINGS: Cardiovascular: Satisfactory opacification the pulmonary arteries to the segmental level. No pulmonary artery filling defects are identified. Central pulmonary arteries are normal caliber. Suboptimal opacification of the thoracic aorta. No discernible acute abnormality of the thoracic aorta proximal great vessels. Aberrant right subclavian artery. Borderline cardiac enlargement. Slight reflux of contrast into the IVC. Mediastinum/Nodes: No mediastinal fluid or gas. Normal thyroid gland and thoracic inlet. No acute abnormality of the trachea or esophagus. No worrisome mediastinal, hilar or axillary adenopathy. Lungs/Pleura: Mixed areas of heterogeneous consolidative and ground-glass opacity  throughout both lungs with a basilar and peripheral predominance. No pneumothorax or visible effusion. Diffuse airways thickening. Upper Abdomen: Indeterminate intermediate attenuation (38 HU) lesion measuring up to 8.5 cm in maximum transaxial dimension within the right lobe liver (4/80). Cholelithiasis with a partially calcified gallstone towards the gallbladder neck. Multiple soft tissue attenuation lesions present along the lesser curvature/gastrohepatic ligament. Largest measuring up to 4.6 cm in size (4/91). Partially exophytic 0.7 cm lesion arising from the upper pole left kidney, corresponding with a previously seen fluid attenuation cyst on prior  comparison CT, could reflect intracystic hemorrhage/proteinaceous debris. Musculoskeletal: Multilevel degenerative changes are present in the imaged portions of the spine. No acute osseous abnormality or suspicious osseous lesion. Probable intramuscular lipoma of the right infraspinatus measuring up to 6.9 by 3.5 cm (4/20). No other acute or worrisome chest wall lesions. Review of the MIP images confirms the above findings. IMPRESSION: 1. No evidence of acute pulmonary artery filling defects. 2. Mixed areas of heterogeneous consolidative and ground-glass opacity throughout both lungs with a basilar and peripheral predominance. Findings are compatible with multifocal pneumonia compatible with a COVID-19 etiology. 3. Indeterminate 4.8 cm lesion in the right lobe liver, incompletely characterized. Additional multiple soft tissue attenuation lesions along the lesser curvature/gastrohepatic ligament, largest measuring up to 4.6 cm in size. These findings are new from comparison abdominal CT 08/15/2018 and could raise concern for potential malignancy. Could consider further evaluation with abdominal MR with contrast. 4. Additional 7 mm hyperdense lesion arising from the upper pole left kidney, could reflect proteinaceous cysts, Ob also be better evaluated on dedicated abdominal imaging. 5. Cholelithiasis. 6. Aberrant right subclavian artery. These results will be called to the ordering clinician or representative by the Radiologist Assistant, and communication documented in the PACS or [employer]. Electronically Signed   By: Lovena Le M.D.   On: 10/03/2019 15:38   MR LIVER W WO CONTRAST  Result Date: 10/04/2019 CLINICAL DATA:  Evaluate liver lesions and upper abdominal adenopathy seen on recent chest CT. EXAM: MRI ABDOMEN WITHOUT AND WITH CONTRAST TECHNIQUE: Multiplanar multisequence MR imaging of the abdomen was performed both before and after the administration of intravenous contrast. CONTRAST:  55mL  GADAVIST GADOBUTROL 1 MMOL/ML IV SOLN COMPARISON:  CT abdomen/pelvis 08/15/2018 FINDINGS: Lower chest: The lungs demonstrate changes of COVID pneumonia as demonstrated on today's chest CT. Hepatobiliary: Numerous diffusion positive hepatic metastatic lesions are noted. Segment 4A lesion measures 3.7 cm on image 7/8. 5 cm lesion and segment 7 on image 12/8. 5 cm lesion in segment 6 on image 18/8. Several other smaller lesions are noted. No intrahepatic biliary dilatation. There is diffuse fatty infiltration of the liver noted. A 2 cm gallstone is noted in the gallbladder. No common bile duct dilatation. Pancreas:  No mass, inflammation or ductal dilatation. Spleen:  Normal size. No focal lesions. Adrenals/Urinary Tract: The adrenal glands and kidneys are unremarkable. Stomach/Bowel: There is a large infiltrating gastric mass involving the fundal region 6.5 x 4.8 cm and is diffusion positive. Moderate contrast enhancement is noted. Adjacent gastrohepatic ligament lymphadenopathy with the largest node measuring 4.6 cm. Vascular/Lymphatic: The aorta is normal in caliber. No dissection. The branch vessels are patent. No retroperitoneal lymphadenopathy. Other:  No ascites or abdominal wall hernia. Musculoskeletal: No worrisome bone lesions. IMPRESSION: 1. 6.5 x 4.8 cm infiltrating gastric mass involving the fundal region. Associated gastrohepatic ligament lymphadenopathy. 2. Numerous hepatic metastatic lesions. 3. Cholelithiasis. Electronically Signed   By: Marijo Sanes M.D.   On: 10/04/2019 06:26  DG Chest Port 1 View  Result Date: 09/28/2019 CLINICAL DATA:  COVID EXAM: PORTABLE CHEST 1 VIEW COMPARISON:  09/24/2019 FINDINGS: Development of patchy bilateral airspace opacities. No pleural effusion. Stable cardiomediastinal silhouette. No pneumothorax. IMPRESSION: Development of patchy bilateral airspace opacities, consistent with bilateral pneumonia. Electronically Signed   By: Donavan Foil M.D.   On: 09/28/2019  23:14   DG Chest Port 1 View  Result Date: 09/24/2019 CLINICAL DATA:  COVID pneumonia, weakness, fatigue EXAM: PORTABLE CHEST 1 VIEW COMPARISON:  09/15/2019 FINDINGS: The lungs are symmetrically expanded. Minimal left basilar atelectasis is again noted. No superimposed confluent pulmonary infiltrate. No pneumothorax or pleural effusion. Cardiac size within normal limits. The pulmonary vascularity is normal. No acute bone abnormality. IMPRESSION: Minimal left basilar atelectasis, unchanged. No superimposed confluent pulmonary infiltrate. Electronically Signed   By: Fidela Salisbury MD   On: 09/24/2019 20:15   ECHOCARDIOGRAM COMPLETE  Result Date: 09/30/2019    ECHOCARDIOGRAM REPORT   Patient Name:   KENTAVIUS DETTORE Date of Exam: 09/30/2019 Medical Rec #:  [id number]         Height:       72.0 in Accession #:    [id number]        Weight:       237.0 lb Date of Birth:  May 27, 1955        BSA:          2.290 m Patient Age:    76 years          BP:           95/60 mmHg Patient Gender: M                 HR:           126 bpm. Exam Location:  [facility] Procedure: 2D Echo Indications:    Endocarditis I38  History:        Patient has no prior history of Echocardiogram examinations.                 Risk Factors:Non-Smoker. Pneumonia due to COVID-19 virus,                 Sepsis, Lactic Acidosis.  Sonographer:    Leavy Cella RDCS (AE) Referring Phys: [id number] OLADAPO ADEFESO IMPRESSIONS  1. Left ventricular ejection fraction, by estimation, is 55 to 60%. The left ventricle has normal function. The left ventricle has no regional wall motion abnormalities. There is moderate left ventricular hypertrophy. Left ventricular diastolic parameters are indeterminate.  2. Right ventricular systolic function is normal. The right ventricular size is normal. Tricuspid regurgitation signal is inadequate for assessing PA pressure.  3. The mitral valve is grossly normal. No evidence of mitral valve regurgitation.  4. The aortic valve  is tricuspid. Aortic valve regurgitation is not visualized.  5. The inferior vena cava is normal in size with <50% respiratory variability, suggesting right atrial pressure of 8 mmHg.  6. Views are somewhat limited, but no definite valvular vegetations are visualized. FINDINGS  Left Ventricle: Left ventricular ejection fraction, by estimation, is 55 to 60%. The left ventricle has normal function. The left ventricle has no regional wall motion abnormalities. The left ventricular internal cavity size was normal in size. There is  moderate left ventricular hypertrophy. Left ventricular diastolic parameters are indeterminate. Right Ventricle: The right ventricular size is normal. No increase in right ventricular wall thickness. Right ventricular systolic function is normal. Tricuspid regurgitation signal is inadequate for assessing PA pressure. Left  Atrium: Left atrial size was normal in size. Right Atrium: Right atrial size was normal in size. Pericardium: There is no evidence of pericardial effusion. Mitral Valve: The mitral valve is grossly normal. No evidence of mitral valve regurgitation. Tricuspid Valve: The tricuspid valve is grossly normal. Tricuspid valve regurgitation is trivial. Aortic Valve: The aortic valve is tricuspid. Aortic valve regurgitation is not visualized. Pulmonic Valve: The pulmonic valve was grossly normal. Pulmonic valve regurgitation is trivial. Aorta: The aortic root is normal in size and structure. Venous: The inferior vena cava is normal in size with less than 50% respiratory variability, suggesting right atrial pressure of 8 mmHg. IAS/Shunts: No atrial level shunt detected by color flow Doppler.  LEFT VENTRICLE PLAX 2D LVIDd:         4.37 cm Diastology LVIDs:         2.20 cm LV e' medial:    7.18 cm/s LV PW:         1.72 cm LV E/e' medial:  9.2 LV IVS:        1.46 cm LV e' lateral:   12.90 cm/s                        LV E/e' lateral: 5.1  RIGHT VENTRICLE RV S prime:     16.00 cm/s TAPSE  (M-mode): 1.5 cm LEFT ATRIUM             Index       RIGHT ATRIUM           Index LA diam:        3.70 cm 1.62 cm/m  RA Area:     15.40 cm LA Vol (A2C):   63.9 ml 27.90 ml/m RA Volume:   39.80 ml  17.38 ml/m LA Vol (A4C):   40.6 ml 17.73 ml/m LA Biplane Vol: 52.4 ml 22.88 ml/m   AORTA Ao Root diam: 3.50 cm MITRAL VALVE MV Area (PHT): 6.60 cm MV Decel Time: 115 msec MV E velocity: 66.00 cm/s MV A velocity: 39.80 cm/s MV E/A ratio:  1.66 Rozann Lesches MD Electronically signed by Rozann Lesches MD Signature Date/Time: 09/30/2019/4:58:16 PM    Final     Kathie Dike, MD  [facility]  If 7PM-7AM, please contact night-coverage [email or web address]  10/11/2019, 7:31 PM   LOS: 13 days

## 2019-10-11 NOTE — Progress Notes (Signed)
Stephen Wilcox, M.D. Gastroenterology & Hepatology   Interval History: No acute events overnight. Patient underwent EGD yesterday that showed an ulcerated mass at the distal esophagus which was extending to the gastric fundus where the bulk of the tumor was located.  This tissue was friable.  Biopsies were performed and they came back positive for adenocarcinoma.  There was also presence of patchy erythema in the antrum and small polyp in the gastric body. Patient states that he is feeling better from the respiratory symptoms as he is not having any cough at the moment and is using oxygen intermittently.  Denies having any abdominal pain, nausea, vomiting, fever, chills, hematochezia or melena.  His hemoglobin has remained stable compared to yesterday.  Inpatient Medications:  Current Facility-Administered Medications:  .  0.9 %  sodium chloride infusion (Manually program via Guardrails IV Fluids), , Intravenous, Once, Lang Snow, FNP, Last Rate: 10 mL/hr at 09/30/19 2054, Restarted at 09/30/19 2054 .  albuterol (VENTOLIN HFA) 108 (90 Base) MCG/ACT inhaler 2 puff, 2 puff, Inhalation, Q6H PRN, Kathie Dike, MD .  ascorbic acid (VITAMIN C) tablet 500 mg, 500 mg, Oral, Daily, Adefeso, Oladapo, DO, 500 mg at 10/11/19 1014 .  chlorpheniramine-HYDROcodone (TUSSIONEX) 10-8 MG/5ML suspension 5 mL, 5 mL, Oral, Q12H PRN, Adefeso, Oladapo, DO, 5 mL at 10/07/19 0936 .  diltiazem (CARDIZEM CD) 24 hr capsule 120 mg, 120 mg, Oral, Daily, Tat, David, MD, 120 mg at 10/11/19 1014 .  guaiFENesin-dextromethorphan (ROBITUSSIN DM) 100-10 MG/5ML syrup 10 mL, 10 mL, Oral, Q4H PRN, Adefeso, Oladapo, DO, 10 mL at 10/09/19 2142 .  hyoscyamine (LEVSIN SL) SL tablet 0.25 mg, 0.25 mg, Sublingual, TID AC, Memon, Jolaine Artist, MD, 0.25 mg at 10/11/19 1448 .  insulin aspart (novoLOG) injection 0-20 Units, 0-20 Units, Subcutaneous, TID WC, Emokpae, Courage, MD, 3 Units at 10/10/19 1300 .  insulin aspart (novoLOG) injection  0-5 Units, 0-5 Units, Subcutaneous, QHS, Emokpae, Courage, MD, 3 Units at 10/09/19 2143 .  metoprolol tartrate (LOPRESSOR) tablet 25 mg, 25 mg, Oral, TID, Emokpae, Courage, MD, 25 mg at 10/11/19 1014 .  pantoprazole (PROTONIX) EC tablet 40 mg, 40 mg, Oral, BID AC, Kathie Dike, MD, 40 mg at 10/11/19 1856 .  tamsulosin (FLOMAX) capsule 0.4 mg, 0.4 mg, Oral, QPC supper, Emokpae, Courage, MD, 0.4 mg at 10/10/19 1735 .  zinc sulfate capsule 220 mg, 220 mg, Oral, Daily, Adefeso, Oladapo, DO, 220 mg at 10/11/19 1014   I/O    Intake/Output Summary (Last 24 hours) at 10/11/2019 1103 Last data filed at 10/10/2019 1632 Gross per 24 hour  Intake 635.88 ml  Output --  Net 635.88 ml     Physical Exam: Temp:  [98 F (36.7 C)-98.1 F (36.7 C)] 98.1 F (36.7 C) (09/20 2135) Pulse Rate:  [65-82] 65 (09/20 2135) Resp:  [14-29] 19 (09/20 2135) BP: (101-117)/(58-95) 117/67 (09/20 2135) SpO2:  [94 %-99 %] 95 % (09/20 2135)  Temp (24hrs), Avg:98.1 F (36.7 C), Min:98 F (36.7 C), Max:98.1 F (36.7 C) GENERAL: The patient is AO x3, in no acute distress. On . HEENT: Head is normocephalic and atraumatic. EOMI are intact. Mouth is well hydrated and without lesions. NECK: Supple. No masses LUNGS: Clear to auscultation. No presence of rhonchi/wheezing/rales. Adequate chest expansion HEART: RRR, normal s1 and s2. ABDOMEN: Soft, nontender, no guarding, no peritoneal signs, and nondistended. BS +. No masses. EXTREMITIES: Without any cyanosis, clubbing, rash, lesions or edema. NEUROLOGIC: AOx3, no focal motor deficit. SKIN: no jaundice, no rashes  Laboratory Data: CBC:  Component Value Date/Time   WBC 13.6 (H) 10/11/2019 0653   RBC 3.22 (L) 10/11/2019 0653   HGB 7.6 (L) 10/11/2019 0653   HCT 26.0 (L) 10/11/2019 0653   PLT 241 10/11/2019 0653   MCV 80.7 10/11/2019 0653   MCH 23.6 (L) 10/11/2019 0653   MCHC 29.2 (L) 10/11/2019 0653   RDW 20.6 (H) 10/11/2019 0653   LYMPHSABS 0.4 (L) 10/07/2019  0845   MONOABS 0.7 10/07/2019 0845   EOSABS 0.0 10/07/2019 0845   BASOSABS 0.0 10/07/2019 0845   COAG: No results found for: INR, PROTIME  BMP:  BMP Latest Ref Rng & Units 10/11/2019 10/07/2019 10/06/2019  Glucose 70 - 99 mg/dL 72 110(H) 186(H)  BUN 8 - 23 mg/dL 22 30(H) 33(H)  Creatinine 0.61 - 1.24 mg/dL 0.64 0.73 0.85  Sodium 135 - 145 mmol/L 134(L) 137 135  Potassium 3.5 - 5.1 mmol/L 4.3 4.4 4.4  Chloride 98 - 111 mmol/L 103 101 98  CO2 22 - 32 mmol/L 26 28 32  Calcium 8.9 - 10.3 mg/dL 7.4(L) 7.5(L) 7.7(L)    HEPATIC:  Hepatic Function Latest Ref Rng & Units 10/07/2019 10/06/2019 10/05/2019  Total Protein 6.5 - 8.1 g/dL 5.0(L) 5.1(L) 5.4(L)  Albumin 3.5 - 5.0 g/dL 2.3(L) 2.2(L) 2.4(L)  AST 15 - 41 U/L _0 ALT 0 - 44 U/L 35 36 38  Alk Phosphatase 38 - 126 U/L 52 49 49  Total Bilirubin 0.3 - 1.2 mg/dL 0.7 0.6 0.8    CARDIAC: No results found for: CKTOTAL, CKMB, CKMBINDEX, TROPONINI    Imaging: I personally reviewed and interpreted the available labs, imaging and endoscopic files.   Assessment/Plan: Stephen Wilcox is a 64 y.o. male with history of recent COVID 19 infection, who came to the hospital for evaluation of worsening shortness of breath, fatigue and lightheadedness.  Gastroenterology was consulted due to severe iron deficiency anemia.  The patient was found to have findings consistent with moderately severe Covid infection.  He has not presented any evidence of active gastrointestinal bleeding during this hospitalization.  The patient was noted to have slow drop in his hemoglobin down to the low sevens but no evidence of clinical bleeding.  Given the presence of a liver lesion found on cross-sectional imaging, the patient underwent an MRI which showed multiple liver lesions, with presence of a 6.5 x 4.8 cm infiltrating gastric mass in the fundal region with lymphadenopathy in the gastrohepatic ligament.  Due to this, he underwent an EGD which showed an ulcerated mass at  the distal esophagus which was extending to the gastric fundus where the bulk of the tumor was located.  This tissue was friable.  Biopsies were performed and they came back positive for adenocarcinoma.  There was also presence of patchy erythema in the antrum and small polyp in the gastric body.  I had a thorough discussion with the patient today regarding this finding, which he understands.  Given the extension of his disease, it would be more important for the patient to be evaluated by oncology to determine his potential options for treatment.  Based on the imaging findings, it seems that the gastric malignancy has involved lymph nodes and liver.  He should continue on oral iron supplementation to improve his iron stores but there is no role for endoscopic treatment at this moment.  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

## 2019-10-12 DIAGNOSIS — R338 Other retention of urine: Secondary | ICD-10-CM

## 2019-10-12 DIAGNOSIS — N179 Acute kidney failure, unspecified: Secondary | ICD-10-CM

## 2019-10-12 DIAGNOSIS — C169 Malignant neoplasm of stomach, unspecified: Secondary | ICD-10-CM

## 2019-10-12 LAB — URINALYSIS, ROUTINE W REFLEX MICROSCOPIC
Bilirubin Urine: NEGATIVE
Glucose, UA: NEGATIVE mg/dL
Ketones, ur: NEGATIVE mg/dL
Leukocytes,Ua: NEGATIVE
Nitrite: NEGATIVE
Protein, ur: NEGATIVE mg/dL
RBC / HPF: >50 RBC/hpf — ABNORMAL HIGH (ref 0–5)
Specific Gravity, Urine: 1.014 (ref 1.005–1.030)
pH: 5 (ref 5.0–8.0)

## 2019-10-12 LAB — COMPREHENSIVE METABOLIC PANEL
ALT: 39 U/L (ref 0–44)
AST: 30 U/L (ref 15–41)
Albumin: 2.1 g/dL — ABNORMAL LOW (ref 3.5–5.0)
Alkaline Phosphatase: 69 U/L (ref 38–126)
Anion gap: 8 (ref 5–15)
BUN: 20 mg/dL (ref 8–23)
CO2: 26 mmol/L (ref 22–32)
Calcium: 7.7 mg/dL — ABNORMAL LOW (ref 8.9–10.3)
Chloride: 97 mmol/L — ABNORMAL LOW (ref 98–111)
Creatinine, Ser: 1.51 mg/dL — ABNORMAL HIGH (ref 0.61–1.24)
GFR calc Af Amer: 56 mL/min — ABNORMAL LOW (ref >60)
GFR calc non Af Amer: 48 mL/min — ABNORMAL LOW (ref >60)
Glucose, Bld: 138 mg/dL — ABNORMAL HIGH (ref 70–99)
Potassium: 4.7 mmol/L (ref 3.5–5.1)
Sodium: 131 mmol/L — ABNORMAL LOW (ref 135–145)
Total Bilirubin: 0.8 mg/dL (ref 0.3–1.2)
Total Protein: 4.6 g/dL — ABNORMAL LOW (ref 6.5–8.1)

## 2019-10-12 LAB — BLOOD GAS, ARTERIAL
Acid-base deficit: 6.6 mmol/L — ABNORMAL HIGH (ref 0.0–2.0)
Bicarbonate: 19.3 mmol/L — ABNORMAL LOW (ref 20.0–28.0)
FIO2: 28
O2 Saturation: 95.2 %
Patient temperature: 37
pCO2 arterial: 23.1 mmHg — ABNORMAL LOW (ref 32.0–48.0)
pH, Arterial: 7.466 — ABNORMAL HIGH (ref 7.350–7.450)
pO2, Arterial: 73 mmHg — ABNORMAL LOW (ref 83.0–108.0)

## 2019-10-12 LAB — GLUCOSE, CAPILLARY
Glucose-Capillary: 137 mg/dL — ABNORMAL HIGH (ref 70–99)
Glucose-Capillary: 136 mg/dL — ABNORMAL HIGH (ref 70–99)
Glucose-Capillary: 67 mg/dL — ABNORMAL LOW (ref 70–99)
Glucose-Capillary: 70 mg/dL (ref 70–99)
Glucose-Capillary: 68 mg/dL — ABNORMAL LOW (ref 70–99)
Glucose-Capillary: 95 mg/dL (ref 70–99)

## 2019-10-12 LAB — CBC
HCT: 27 % — ABNORMAL LOW (ref 39.0–52.0)
Hemoglobin: 7.8 g/dL — ABNORMAL LOW (ref 13.0–17.0)
MCH: 23.1 pg — ABNORMAL LOW (ref 26.0–34.0)
MCHC: 28.9 g/dL — ABNORMAL LOW (ref 30.0–36.0)
MCV: 80.1 fL (ref 80.0–100.0)
Platelets: 264 10*3/uL (ref 150–400)
RBC: 3.37 MIL/uL — ABNORMAL LOW (ref 4.22–5.81)
RDW: 20.9 % — ABNORMAL HIGH (ref 11.5–15.5)
WBC: 15.3 10*3/uL — ABNORMAL HIGH (ref 4.0–10.5)
nRBC: 0 % (ref 0.0–0.2)

## 2019-10-12 MED ORDER — DEXTROSE 50 % IV SOLN
INTRAVENOUS | Status: AC
  Administered 2019-10-12: 12.5 g via INTRAVENOUS
  Filled 2019-10-12: qty 50

## 2019-10-12 MED ORDER — BUSPIRONE HCL 5 MG PO TABS
5.0000 mg | ORAL_TABLET | Freq: Three times a day (TID) | ORAL | Status: DC
  Administered 2019-10-12 – 2019-10-25 (×38): 5 mg via ORAL
  Filled 2019-10-12 (×39): qty 1

## 2019-10-12 MED ORDER — DEXTROSE 50 % IV SOLN: 12.5000 g | INTRAVENOUS | Status: AC

## 2019-10-12 MED ORDER — SODIUM CHLORIDE 0.9 % IV SOLN: INTRAVENOUS | Status: DC

## 2019-10-12 MED ORDER — LACTATED RINGERS IV BOLUS
1000.0000 mL | Freq: Once | INTRAVENOUS | Status: AC
  Administered 2019-10-13: 1000 mL via INTRAVENOUS

## 2019-10-12 MED ORDER — METHOCARBAMOL 500 MG PO TABS: 500.0000 mg | ORAL_TABLET | Freq: Three times a day (TID) | ORAL | Status: DC | PRN

## 2019-10-12 MED ORDER — SODIUM CHLORIDE 0.9 % IV BOLUS
500.0000 mL | Freq: Once | INTRAVENOUS | Status: AC
  Administered 2019-10-12: 500 mL via INTRAVENOUS

## 2019-10-12 MED ORDER — CHLORHEXIDINE GLUCONATE CLOTH 2 % EX PADS
6.0000 | MEDICATED_PAD | Freq: Every day | CUTANEOUS | Status: DC
  Administered 2019-10-12 – 2019-10-25 (×14): 6 via TOPICAL

## 2019-10-12 MED ORDER — POLYSACCHARIDE IRON COMPLEX 150 MG PO CAPS
150.0000 mg | ORAL_CAPSULE | Freq: Every day | ORAL | Status: DC
  Administered 2019-10-12 – 2019-10-25 (×14): 150 mg via ORAL
  Filled 2019-10-12 (×14): qty 1

## 2019-10-12 MED ORDER — MUSCLE RUB 10-15 % EX CREA
1.0000 "application " | TOPICAL_CREAM | CUTANEOUS | Status: DC | PRN
  Filled 2019-10-12: qty 85

## 2019-10-12 MED ORDER — HYDROCODONE-ACETAMINOPHEN 5-325 MG PO TABS
1.0000 | ORAL_TABLET | Freq: Four times a day (QID) | ORAL | Status: DC | PRN
  Administered 2019-10-14 – 2019-10-24 (×3): 1 via ORAL
  Filled 2019-10-12 (×4): qty 1

## 2019-10-12 MED ORDER — ALBUMIN HUMAN 5 % IV SOLN
12.5000 g | Freq: Once | INTRAVENOUS | Status: AC
  Administered 2019-10-12: 12.5 g via INTRAVENOUS
  Filled 2019-10-12 (×2): qty 250

## 2019-10-12 NOTE — Progress Notes (Signed)
   10/12/19 2142  Vitals  Temp 98.9 F (37.2 C)  Temp Source Oral  BP (!) 58/44  MAP (mmHg) (!) 50  BP Location Left Arm  BP Method Automatic  Patient Position (if appropriate) Lying  Pulse Rate 90  Pulse Rate Source Monitor  MEWS COLOR  MEWS Score Color Red  Oxygen Therapy  SpO2 (!) 87 %  O2 Device HFNC  O2 Flow Rate (L/min) 2 L/min  MEWS Score  MEWS Temp 0  MEWS Systolic 3  MEWS Pulse 0  MEWS RR 0  MEWS LOC 1  MEWS Score 4  Provider Notification  Provider Name/Title m denny  Date Provider Notified 10/12/19  Time Provider Notified 2148  Notification Type Page  Notification Reason Change in status (low BP)  Response See new orders  Date of Provider Response 10/12/19  Time of Provider Response 2150

## 2019-10-12 NOTE — Progress Notes (Signed)
notified Stephen denny, np of change in pt condition. MEWS escalation. See recent vital signs. Pt lethargic at this time. Will follow through with new orders

## 2019-10-12 NOTE — Progress Notes (Signed)
Subjective:  Patient complains of feeling fullness in epigastric region.  He denies nausea vomiting chest pain or shortness of breath.  Current Medications:  Current Facility-Administered Medications:  .  0.9 %  sodium chloride infusion (Manually program via Guardrails IV Fluids), , Intravenous, Once, Lang Snow, FNP, Last Rate: 10 mL/hr at 09/30/19 2054, Restarted at 09/30/19 2054 .  0.9 %  sodium chloride infusion, , Intravenous, Continuous, Barton Dubois, MD .  albuterol (VENTOLIN HFA) 108 (90 Base) MCG/ACT inhaler 2 puff, 2 puff, Inhalation, Q6H PRN, Kathie Dike, MD .  ascorbic acid (VITAMIN C) tablet 500 mg, 500 mg, Oral, Daily, Adefeso, Oladapo, DO, 500 mg at 10/12/19 1003 .  busPIRone (BUSPAR) tablet 5 mg, 5 mg, Oral, TID, Barton Dubois, MD .  chlorpheniramine-HYDROcodone (TUSSIONEX) 10-8 MG/5ML suspension 5 mL, 5 mL, Oral, Q12H PRN, Adefeso, Oladapo, DO, 5 mL at 10/07/19 0936 .  diltiazem (CARDIZEM CD) 24 hr capsule 120 mg, 120 mg, Oral, Daily, Tat, David, MD, 120 mg at 10/12/19 1003 .  guaiFENesin-dextromethorphan (ROBITUSSIN DM) 100-10 MG/5ML syrup 10 mL, 10 mL, Oral, Q4H PRN, Adefeso, Oladapo, DO, 10 mL at 10/09/19 2142 .  HYDROcodone-acetaminophen (NORCO/VICODIN) 5-325 MG per tablet 1-2 tablet, 1-2 tablet, Oral, Q6H PRN, Barton Dubois, MD .  hyoscyamine (LEVSIN SL) SL tablet 0.25 mg, 0.25 mg, Sublingual, TID AC, Memon, Jolaine Artist, MD, 0.25 mg at 10/12/19 1221 .  insulin aspart (novoLOG) injection 0-20 Units, 0-20 Units, Subcutaneous, TID WC, Emokpae, Courage, MD, 3 Units at 10/12/19 1221 .  insulin aspart (novoLOG) injection 0-5 Units, 0-5 Units, Subcutaneous, QHS, Emokpae, Courage, MD, 3 Units at 10/09/19 2143 .  iron polysaccharides (NIFEREX) capsule 150 mg, 150 mg, Oral, Daily, Barton Dubois, MD, 150 mg at 10/12/19 1003 .  methocarbamol (ROBAXIN) tablet 500 mg, 500 mg, Oral, Q8H PRN, Barton Dubois, MD .  metoprolol tartrate (LOPRESSOR) tablet 25 mg, 25 mg, Oral, TID,  Emokpae, Courage, MD, 25 mg at 10/12/19 1003 .  Muscle Rub CREA 1 application, 1 application, Topical, PRN, Memon, Jehanzeb, MD .  pantoprazole (PROTONIX) EC tablet 40 mg, 40 mg, Oral, BID AC, Kathie Dike, MD, 40 mg at 10/12/19 0804 .  tamsulosin (FLOMAX) capsule 0.4 mg, 0.4 mg, Oral, QPC supper, Emokpae, Courage, MD, 0.4 mg at 10/11/19 1637 .  zinc sulfate capsule 220 mg, 220 mg, Oral, Daily, Adefeso, Oladapo, DO, 220 mg at 10/12/19 1003   Objective: Blood pressure 109/61, pulse 93, temperature 98.1 F (36.7 C), temperature source Oral, resp. rate (!) 22, height 6' (1.829 m), weight 107.5 kg, SpO2 90 %. Patient is alert and responds appropriate to simple commands but he appears to be confused. He is talking about his laptop. Abdomen remains full with mild midepigastric tenderness.  No organomegaly or masses.  Labs/studies Results:  CBC Latest Ref Rng & Units 10/12/2019 10/11/2019 10/10/2019  WBC 4.0 - 10.5 K/uL 15.3(H) 13.6(H) 13.9(H)  Hemoglobin 13.0 - 17.0 g/dL 7.8(L) 7.6(L) 7.7(L)  Hematocrit 39 - 52 % 27.0(L) 26.0(L) 26.9(L)  Platelets 150 - 400 K/uL 264 241 266    CMP Latest Ref Rng & Units 10/12/2019 10/11/2019 10/07/2019  Glucose 70 - 99 mg/dL 138(H) 72 110(H)  BUN 8 - 23 mg/dL 20 22 30(H)  Creatinine 0.61 - 1.24 mg/dL 1.51(H) 0.64 0.73  Sodium 135 - 145 mmol/L 131(L) 134(L) 137  Potassium 3.5 - 5.1 mmol/L 4.7 4.3 4.4  Chloride 98 - 111 mmol/L 97(L) 103 101  CO2 22 - 32 mmol/L 26 26 28   Calcium 8.9 - 10.3 mg/dL  7.7(L) 7.4(L) 7.5(L)  Total Protein 6.5 - 8.1 g/dL 4.6(L) - 5.0(L)  Total Bilirubin 0.3 - 1.2 mg/dL 0.8 - 0.7  Alkaline Phos 38 - 126 U/L 69 - 52  AST 15 - 41 U/L 30 - 26  ALT 0 - 44 U/L 39 - 35    Hepatic Function Latest Ref Rng & Units 10/12/2019 10/07/2019 10/06/2019  Total Protein 6.5 - 8.1 g/dL 4.6(L) 5.0(L) 5.1(L)  Albumin 3.5 - 5.0 g/dL 2.1(L) 2.3(L) 2.2(L)  AST 15 - 41 U/L 30 26 29   ALT 0 - 44 U/L 39 35 36  Alk Phosphatase 38 - 126 U/L 69 52 49  Total  Bilirubin 0.3 - 1.2 mg/dL 0.8 0.7 0.6    Lab Results  Component Value Date   CRP 0.6 10/07/2019    Abdominal pelvic CT results noted. Bilateral lower lobe infiltrates Multiple large heterogeneous low-attenuation liver masses consistent with metastatic disease Large gastric mass. Multiple soft tissue masses within the mesentery across upper abdomen concerning for metastatic disease. Lining diverticulosis.  Assessment:  #1.  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  Patient is scheduled to undergo Port-A-Cath placement on 10/14/2019.  #2.  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  Hemoglobin remains low but stable.  #3.  Confusion.  Further work-up per Dr. Dyann Kief.   Please note I called the patient's daughter [name] and brought her up-to-date on biopsy results and patient's condition.

## 2019-10-12 NOTE — Progress Notes (Signed)
Initial Nutrition Assessment  RD working remotely  Obesity unspecified  INTERVENTION:  48 hr-Calorie count initiated -staff notified  Soft diet + oral supplements with each tray  Obtain current weight-nursing  NUTRITION DIAGNOSIS:   (P) Increased nutrient needs related to cancer and cancer related treatments, acute illness, chronic illness as evidenced by estimated needs.  GOAL:  Pt to meet >/= 90% of their estimated nutrition needs   MONITOR:  Po intake, labs and wt trends  REASON FOR ASSESSMENT: Consult Calorie Count  ASSESSMENT: Patient is an obese 64 yo male who presented with malaise generalized weakness, dizziness. Acute respiratory failure - COVID-19 pneumonia. He has completed remdesivir. Symptomatic anemia-2 units PRBC. New onset artrial fibrillation. Diabetes- A1C-6.9% on 09/28/19.  Incidental CT finding- lesion right liver lobe, with multiple soft tissue lesions. Gastric and Hepatic masses. EGD- biopsy 9/20. 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  Patient is undergoing a calorie count for assessment of his ability to maintain 1800 kcal/day- full liquid diet. Patient will need to consider alternate source of nutrition if unable to meet nutrition needs orally.  Attempted to speak with patient via telephone but no answer. According to review of flowsheet he has consumed 50% x 4 meals, 100% x 3 meals and 40% x1 meal. Soft diet provided.  Medications reviewed and include: ascorbic acid, zinc, novolog, protonix.  Patient weight history-appears stable 107-108 kg the past 14 months. Need current weight to assess  change during this hospitalization.  Labs: BMP Latest Ref Rng & Units 10/12/2019 10/11/2019 10/07/2019  Glucose 70 - 99 mg/dL 138(H) 72 110(H)  BUN 8 - 23 mg/dL 20 22 30(H)  Creatinine 0.61 - 1.24 mg/dL 1.51(H) 0.64 0.73  Sodium 135 - 145 mmol/L 131(L)  134(L) 137  Potassium 3.5 - 5.1 mmol/L 4.7 4.3 4.4  Chloride 98 - 111 mmol/L 97(L) 103 101  CO2 22 - 32 mmol/L 26 26 28   Calcium 8.9 - 10.3 mg/dL 7.7(L) 7.4(L) 7.5(L)    NUTRITION - FOCUSED PHYSICAL EXAM: Unable to perform- RD working remotely  Diet Order:   Diet Order            DIET SOFT Room service appropriate? Yes; Fluid consistency: Thin  Diet effective now                 EDUCATION NEEDS:   No education needs have been identified at this time Skin:  Skin Assessment: Reviewed RN Assessment  Last BM:  9/20  Height:   Ht Readings from Last 1 Encounters:  09/28/19 6' (1.829 m)    Weight:   Wt Readings from Last 1 Encounters:  09/28/19 107.5 kg  Adjusted bw-89.6 kg  Ideal Body Weight:   81 kg  BMI:  Body mass index is 32.14 kg/m.  Estimated Nutritional Needs:   Kcal:  2240-2508  Protein:  125-134 gr  Fluid:  >2.2 liters daily  Colman Cater MS,RD,CSG,LDN Pager: Shea Evans

## 2019-10-12 NOTE — Progress Notes (Signed)
PROGRESS NOTE  Stephen Wilcox TFT:[id number] DOB: 17-Feb-1955 DOA: 09/28/2019 PCP: Jake Samples, PA-C  Brief History:  64 year old male without any documented significant chronic medical problems presenting to the ED on 09/24/2019 with lightheadedness malaise, and some generalized weakness.  Patient was diagnosed with positive Covid.  He had declined monoclonal antibody infusion.  On 09/28/2019, the patient presented back to the emergency department with worsening generalized weakness, dizziness, and cough and malaise.  He was noted to have oxygen saturation of 82% room air and placed on 2 L nasal cannula.  Was subsequently started on remdesivir and IV steroids.  At the time of admission, the patient was noted to have a hemoglobin of 7.3 which was a significant drop when compared to 09/24/2019 when his hemoglobin was 9.1.  Hemoccult was noted to be positive during admission.  GI was consulted to assist with management.  Dr. Laural Golden saw the patient.  He did not feel that the patient had any overt active GI bleeding.  He recommended PPI infusion for 72 hours and subsequently transitioned to oral Protonix.  He recommended endoscopic evaluation if the patient's hemoglobin continued to drop again requiring transfusion.  The patient was transfused 2 units PRBC.  His hemoglobin went up to 9.3.  The patient's oxygen demand gradually increased to 6 L, but subsequently remained stable.  Blood cultures grew Staph epidermidis in 1 of 2 sets.  This was thought to be a contaminant.  Empiric antibiotics were discontinued.  On 09/29/2019, the patient was noted to have atrial fibrillation.  EKG confirmed atrial fibrillation.  The patient was started on oral diltiazem.  Echocardiogram on 09/30/2019 showed EF 55 to 60%, no WMA.  Assessment/Plan: Acute respiratory failure with hypoxia secondary to COVID-19 pneumonia -sepsis ruled out -Patient was weaned off of oxygen and is currently on 2L -Oxygen saturation  90-92% -He has completed his course of steroids -Continue vitamin C and zinc -CRP 19.4>> 15.8>> 7.9>> 3.6>> 1.7>> 1.0>>0.8 -Ferritin 59>> 70>> 95>> 90>> 67>> 53>>50 -D-dimer 1.89>> 1.48>> 1.26>> 1.13>> 1.31>> 1.48>>1.71 -finished 5 days remdesivir 10/03/19 -10/03/19 CTA chest--no PE; 4.8 cm lesion in the right lobe liver, incompletely characterized. 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 Delton Coombes who will follow him up as an outpatient -Requested that CT abdomen and pelvis with contrast be performed for to complete staging process -Also requested possible port placement by general surgery for chemotherapy -Discussed with Dr. Arnoldo Morale who has seen patient and is planning for port s cath placement on 10/14/19 he has had trying to reach someone broke. -Regarding nutrition, will follow feeding supplements recommended by dietitian; continue calorie counting. -If patient is unable to maintain 1800 -calorie/day intake on full liquid diet, may need to consider PEG tube placement to supplement nutrition.  Symptomatic anemia/heme positive stool -Appreciate GI consult -Dr. Laural Golden followed the patient--> recommended IV Protonix then transition to p.o. Protonix -Transfused 2 units PRBC during this hospitalization. -follow Hgb trend -in retrospect, gastric mass may have been source of bleed -Transfuse for hemoglobin less than 7  New  onset atrial fibrillation -Currently rate controlled -Transition to long-acting  diltiazem-->increased to 180 mg (with good control increase rate) -Continue metoprolol -09/30/2019 echo EF 55 to 60%, no WMA -CHA2DS2-VASc = 1 -TSH--0.733 -Will hold anticoagulation/aspirin in light of high risk for bleeding from gastric lesion.  Diabetes mellitus type 2 -09/28/2019 hemoglobin A1c 6.9 -Initially having episodes of hyperglycemia due to steroids -Blood sugars have trended down since steroids have been discontinued. -Basal insulin/Premeal insulin also discontinued since he was having intermittent episodes of hypoglycemia -Continue to closely follow patient's CBGs and further adjust management as required.  Microcytic anemia/Iron deficiency -iron saturation 3% -ferritin low despite COVID-19 -Start iron supplementation when recovered from COVID-19  Thrombocytosis -due to iron deficiency and acute medical illness -Continue to follow platelets count intermittently.  Class II obesity -BMI 32.14 -Lifestyle modification, portion control, low calorie diet recommended.  Acute kidney injury -With concern for obstructive uropathy -Foley catheter will be placed -Gentle hydration with provided -Minimize the use of nephrotoxic agents-follow renal function trend.  BPH/urinary retention -Continue tamsulosin -Foley catheter placed -Gentle fluid resuscitation -Follow clinical response.  Bacteremia -Staph epidermidis 1 out of 2 sets -Represents contaminant -Has remained afebrile.  Generalized weakness -PT evaluation--> skilled nursing facility with which the patient and daughter agree.  Status is: Inpatient  Remains inpatient appropriate because:IV treatments appropriate due to intensity of illness or inability to take PO   Dispo: The patient is from: Home  Anticipated d/c is to: SNF  Anticipated d/c date is: 2-3 day  Patient currently is not medically stable to d/c.  After discussion with general surgery plan is for  Port-A-Cath placement on 10/14/2019.  Patient will follow up with Dr. Delton Coombes as an outpatient for initiation of chemotherapy.  Due to acute urinary retention and acute kidney injury, will provide gentle fluid resuscitation, continue Flomax and Foley catheter has been placed.  Bladder scan with more than 800 mL retained.  Will check urinalysis, urine culture and if his renal function failed to improve will proceed with renal ultrasound.  No fever.   Family Communication:   Daughter updated 9/21  Consultants:  GI--Rehman  Code Status:  FULL   DVT Prophylaxis:  SCDs   Procedures: As Listed in Progress Note Above  Antibiotics: None  Follow facility subjective: No chest pain, no vomiting, no headaches.  Complaining of abdominal and back pain.  Reports intermittent nausea.  Having difficulty resting at night; intermittently anxious and with depressed mood.  He is weak and deconditioned on examination.  No frank fevers but having chills.  Acute urinary retention has been reported today.  Objective: Vitals:   10/11/19 1456 10/11/19 2034 10/11/19 2115 10/12/19 0608  BP: 103/63  129/78 109/61  Pulse: 74  64 (!) 56  Resp: 16  18 17   Temp: 98.5 F (36.9 C)  98.2 F (36.8 C) 97.7 F (36.5 C)  TempSrc: Oral  Oral Oral  SpO2: 96% 91% 95% 92%  Weight:      Height:       No intake or output data in the 24 hours ending 10/12/19 1535 Weight change:   Exam: General exam: Alert, awake, oriented x 3, but intermittently confused.anxious and complaining of abdominal and back pain.  Still short of breath with activity and reporting feeling weak and deconditioned. Respiratory system: No wheezing, using oxygen supplementation; positive rhonchi bilaterally.  No using accessory muscle. Cardiovascular system:RRR. No murmurs, rubs, gallops. Gastrointestinal system: Abdomen is nondistended, soft and nontender. No organomegaly or masses felt. Normal bowel sounds  heard. Central nervous system:  Alert and oriented. No focal neurological deficits. Extremities: No cyanosis or clubbing. Skin: No rashes, no petechiae. Psychiatry: Judgement and insight appear normal.  Flat affect intermittently anxious as per nursing report..    Data Reviewed: I have personally reviewed following labs and imaging studies  Basic Metabolic Panel: Recent Labs  Lab 10/06/19 0716 10/07/19 0845 10/11/19 0653 10/12/19 0611  NA 135 137 134* 131*  K 4.4 4.4 4.3 4.7  CL 98 101 103 97*  CO2 32 28 26 26   GLUCOSE 186* 110* 72 138*  BUN 33* 30* 22 20  CREATININE 0.85 0.73 0.64 1.51*  CALCIUM 7.7* 7.5* 7.4* 7.7*   Liver Function Tests: Recent Labs  Lab 10/06/19 0716 10/07/19 0845 10/12/19 0611  AST 29 26 30   ALT 36 35 39  ALKPHOS 49 52 69  BILITOT 0.6 0.7 0.8  PROT 5.1* 5.0* 4.6*  ALBUMIN 2.2* 2.3* 2.1*   CBC: Recent Labs  Lab 10/06/19 0716 10/07/19 0845 10/10/19 0733 10/11/19 0653 10/12/19 0611  WBC 13.0* 15.2* 13.9* 13.6* 15.3*  NEUTROABS 11.9* 13.8*  --   --   --   HGB 8.4* 8.7* 7.7* 7.6* 7.8*  HCT 28.4* 30.6* 26.9* 26.0* 27.0*  MCV 78.7* 80.7 80.3 80.7 80.1  PLT 329 311 266 241 264   CBG: Recent Labs  Lab 10/11/19 1146 10/11/19 1634 10/11/19 2119 10/12/19 0812 10/12/19 1147  GLUCAP 125* 120* 128* 137* 136*   Urine analysis:    Component Value Date/Time   COLORURINE YELLOW 08/15/2018 1009   APPEARANCEUR CLEAR 08/15/2018 1009   LABSPEC 1.013 08/15/2018 1009   PHURINE 6.0 08/15/2018 1009   GLUCOSEU NEGATIVE 08/15/2018 1009   HGBUR MODERATE (A) 08/15/2018 1009   BILIRUBINUR NEGATIVE 08/15/2018 1009   KETONESUR NEGATIVE 08/15/2018 1009   PROTEINUR NEGATIVE 08/15/2018 1009   NITRITE NEGATIVE 08/15/2018 1009   LEUKOCYTESUR NEGATIVE 08/15/2018 1009   Sepsis Labs: No results found for this or any previous visit (from the past 240 hour(s)).   Scheduled Meds: . sodium chloride   Intravenous Once  . vitamin C  500 mg Oral Daily  . busPIRone  5 mg Oral TID  . diltiazem   120 mg Oral Daily  . hyoscyamine  0.25 mg Sublingual TID AC  . insulin aspart  0-20 Units Subcutaneous TID WC  . insulin aspart  0-5 Units Subcutaneous QHS  . iron polysaccharides  150 mg Oral Daily  . metoprolol tartrate  25 mg Oral TID  . pantoprazole  40 mg Oral BID AC  . tamsulosin  0.4 mg Oral QPC supper  . zinc sulfate  220 mg Oral Daily   Continuous Infusions:  Procedures/Studies: DG Chest 2 View  Result Date: 09/15/2019 CLINICAL DATA:  Acute chest pain. EXAM: CHEST - 2 VIEW COMPARISON:  None. FINDINGS: The heart size and mediastinal contours are within normal limits. No pneumothorax or pleural effusion is noted. Right lung is clear. Minimal left basilar subsegmental atelectasis or scarring is noted. The visualized skeletal structures are unremarkable. IMPRESSION: Minimal left basilar subsegmental atelectasis or scarring. Electronically Signed   By: Marijo Conception M.D.   On: 09/15/2019 16:58   DG Ribs Unilateral Right  Result Date: 09/15/2019 CLINICAL DATA:  Acute right chest pain. EXAM: RIGHT RIBS - 2 VIEW COMPARISON:  None. FINDINGS: No fracture or other bone lesions are seen involving the ribs. IMPRESSION: Negative. Electronically Signed   By: Marijo Conception M.D.   On: 09/15/2019 16:57   CT  ANGIO CHEST PE W OR WO CONTRAST  Result Date: 10/03/2019 CLINICAL DATA:  COVID-19 positive, weakness, dizziness and shortness of breath EXAM: CT ANGIOGRAPHY CHEST WITH CONTRAST TECHNIQUE: Multidetector CT imaging of the chest was performed using the standard protocol during bolus administration of intravenous contrast. Multiplanar CT image reconstructions and MIPs were obtained to evaluate the vascular anatomy. CONTRAST:  141mL OMNIPAQUE IOHEXOL 350 MG/ML SOLN COMPARISON:  Radiograph 09/28/2019, CT abdomen pelvis 08/15/2018 FINDINGS: Cardiovascular: Satisfactory opacification the pulmonary arteries to the segmental level. No pulmonary artery filling defects are identified. Central pulmonary  arteries are normal caliber. Suboptimal opacification of the thoracic aorta. No discernible acute abnormality of the thoracic aorta proximal great vessels. Aberrant right subclavian artery. Borderline cardiac enlargement. Slight reflux of contrast into the IVC. Mediastinum/Nodes: No mediastinal fluid or gas. Normal thyroid gland and thoracic inlet. No acute abnormality of the trachea or esophagus. No worrisome mediastinal, hilar or axillary adenopathy. Lungs/Pleura: Mixed areas of heterogeneous consolidative and ground-glass opacity throughout both lungs with a basilar and peripheral predominance. No pneumothorax or visible effusion. Diffuse airways thickening. Upper Abdomen: Indeterminate intermediate attenuation (38 HU) lesion measuring up to 8.5 cm in maximum transaxial dimension within the right lobe liver (4/80). Cholelithiasis with a partially calcified gallstone towards the gallbladder neck. Multiple soft tissue attenuation lesions present along the lesser curvature/gastrohepatic ligament. Largest measuring up to 4.6 cm in size (4/91). Partially exophytic 0.7 cm lesion arising from the upper pole left kidney, corresponding with a previously seen fluid attenuation cyst on prior comparison CT, could reflect intracystic hemorrhage/proteinaceous debris. Musculoskeletal: Multilevel degenerative changes are present in the imaged portions of the spine. No acute osseous abnormality or suspicious osseous lesion. Probable intramuscular lipoma of the right infraspinatus measuring up to 6.9 by 3.5 cm (4/20). No other acute or worrisome chest wall lesions. Review of the MIP images confirms the above findings. IMPRESSION: 1. No evidence of acute pulmonary artery filling defects. 2. Mixed areas of heterogeneous consolidative and ground-glass opacity throughout both lungs with a basilar and peripheral predominance. Findings are compatible with multifocal pneumonia compatible with a COVID-19 etiology. 3. Indeterminate 4.8 cm  lesion in the right lobe liver, incompletely characterized. Additional multiple soft tissue attenuation lesions along the lesser curvature/gastrohepatic ligament, largest measuring up to 4.6 cm in size. These findings are new from comparison abdominal CT 08/15/2018 and could raise concern for potential malignancy. Could consider further evaluation with abdominal MR with contrast. 4. Additional 7 mm hyperdense lesion arising from the upper pole left kidney, could reflect proteinaceous cysts, Ob also be better evaluated on dedicated abdominal imaging. 5. Cholelithiasis. 6. Aberrant right subclavian artery. These results will be called to the ordering clinician or representative by the Radiologist Assistant, and communication documented in the PACS or [employer]. Electronically Signed   By: Lovena Le M.D.   On: 10/03/2019 15:38   CT ABDOMEN PELVIS W CONTRAST  Result Date: 10/11/2019 CLINICAL DATA:  COVID positive. EXAM: CT ABDOMEN AND PELVIS WITH CONTRAST TECHNIQUE: Multidetector CT imaging of the abdomen and pelvis was performed using the standard protocol following bolus administration of intravenous contrast. CONTRAST:  70mL OMNIPAQUE IOHEXOL 300 MG/ML  SOLN COMPARISON:  August 15, 2018 FINDINGS: Lower chest: Marked severity bilateral lower lobe infiltrates are seen. Hepatobiliary: Multiple large heterogeneous low-attenuation liver masses are seen. The largest measures approximately 6.0 cm x 5.3 cm x 6.1 cm. A 1.7 cm gallstone is seen within the neck of an otherwise normal-appearing gallbladder. Pancreas: Unremarkable. No pancreatic ductal dilatation or surrounding inflammatory  changes. Spleen: Normal in size without focal abnormality. Adrenals/Urinary Tract: Adrenal glands are unremarkable. Kidneys are normal in size, without renal calculi or hydronephrosis. A stable 1.2 cm diameter area of heterogeneous low attenuation is seen within the medial aspect of the mid to lower right kidney. A stable 1.2 cm  cyst is seen along the posterior aspect of the upper pole of the left kidney. Bladder is unremarkable. Stomach/Bowel: A 3.0 cm x 8.7 cm x 6.3 cm heterogeneous soft tissue mass is seen extending from the wall of the lesser sac of the stomach into the gastric lumen (axial CT images 15 through 25, CT series number 2). Appendix appears normal. There is no evidence of bowel dilatation. Numerous diverticula are seen throughout the descending and sigmoid colon. Moderate severity thickening of the proximal sigmoid colon is noted (best seen on coronal reformatted images 43 through 50, CT series number 5). Vascular/Lymphatic: No significant vascular findings are present. Reproductive: The prostate gland is markedly enlarged. Other: Multiple round heterogeneous soft tissue masses of various sizes are seen within the upper abdomen, adjacent to the lesser sac of the stomach. The largest measures approximately 5.2 cm x 5.6 cm x 4.6 cm. Musculoskeletal: Multilevel degenerative changes are seen throughout the lumbar spine. IMPRESSION: 1. Marked severity bilateral lower lobe infiltrates. 2. Multiple large heterogeneous low-attenuation liver masses, consistent with metastatic disease. 3. Large gastric mass, suspicious for primary neoplasm. 4. Multiple soft tissue masses within the mesentery of the upper abdomen, adjacent to the lesser sac of the stomach, consistent with metastatic disease. 5. Colonic diverticulosis. 6. Thickening of the proximal sigmoid colon which may represent sequelae associated with mild colitis/diverticulitis correlation with follow-up abdomen pelvis CT is recommended to exclude the presence of an underlying neoplasm. 7. Stable area of low attenuation within the right kidney. While this may represent a hemorrhagic cyst, correlation with renal ultrasound is recommended. 8. Cholelithiasis. Electronically Signed   By: Virgina Norfolk M.D.   On: 10/11/2019 23:24   MR LIVER W WO CONTRAST  Result Date:  10/04/2019 CLINICAL DATA:  Evaluate liver lesions and upper abdominal adenopathy seen on recent chest CT. EXAM: MRI ABDOMEN WITHOUT AND WITH CONTRAST TECHNIQUE: Multiplanar multisequence MR imaging of the abdomen was performed both before and after the administration of intravenous contrast. CONTRAST:  9mL GADAVIST GADOBUTROL 1 MMOL/ML IV SOLN COMPARISON:  CT abdomen/pelvis 08/15/2018 FINDINGS: Lower chest: The lungs demonstrate changes of COVID pneumonia as demonstrated on today's chest CT. Hepatobiliary: Numerous diffusion positive hepatic metastatic lesions are noted. Segment 4A lesion measures 3.7 cm on image 7/8. 5 cm lesion and segment 7 on image 12/8. 5 cm lesion in segment 6 on image 18/8. Several other smaller lesions are noted. No intrahepatic biliary dilatation. There is diffuse fatty infiltration of the liver noted. A 2 cm gallstone is noted in the gallbladder. No common bile duct dilatation. Pancreas:  No mass, inflammation or ductal dilatation. Spleen:  Normal size. No focal lesions. Adrenals/Urinary Tract: The adrenal glands and kidneys are unremarkable. Stomach/Bowel: There is a large infiltrating gastric mass involving the fundal region 6.5 x 4.8 cm and is diffusion positive. Moderate contrast enhancement is noted. Adjacent gastrohepatic ligament lymphadenopathy with the largest node measuring 4.6 cm. Vascular/Lymphatic: The aorta is normal in caliber. No dissection. The branch vessels are patent. No retroperitoneal lymphadenopathy. Other:  No ascites or abdominal wall hernia. Musculoskeletal: No worrisome bone lesions. IMPRESSION: 1. 6.5 x 4.8 cm infiltrating gastric mass involving the fundal region. Associated gastrohepatic ligament lymphadenopathy. 2. Numerous hepatic  metastatic lesions. 3. Cholelithiasis. Electronically Signed   By: Marijo Sanes M.D.   On: 10/04/2019 06:26   DG Chest Port 1 View  Result Date: 09/28/2019 CLINICAL DATA:  COVID EXAM: PORTABLE CHEST 1 VIEW COMPARISON:   09/24/2019 FINDINGS: Development of patchy bilateral airspace opacities. No pleural effusion. Stable cardiomediastinal silhouette. No pneumothorax. IMPRESSION: Development of patchy bilateral airspace opacities, consistent with bilateral pneumonia. Electronically Signed   By: Donavan Foil M.D.   On: 09/28/2019 23:14   DG Chest Port 1 View  Result Date: 09/24/2019 CLINICAL DATA:  COVID pneumonia, weakness, fatigue EXAM: PORTABLE CHEST 1 VIEW COMPARISON:  09/15/2019 FINDINGS: The lungs are symmetrically expanded. Minimal left basilar atelectasis is again noted. No superimposed confluent pulmonary infiltrate. No pneumothorax or pleural effusion. Cardiac size within normal limits. The pulmonary vascularity is normal. No acute bone abnormality. IMPRESSION: Minimal left basilar atelectasis, unchanged. No superimposed confluent pulmonary infiltrate. Electronically Signed   By: Fidela Salisbury MD   On: 09/24/2019 20:15   ECHOCARDIOGRAM COMPLETE  Result Date: 09/30/2019    ECHOCARDIOGRAM REPORT   Patient Name:   JACEION ADAY Date of Exam: 09/30/2019 Medical Rec #:  [id number]         Height:       72.0 in Accession #:    [id number]        Weight:       237.0 lb Date of Birth:  [birth year]        BSA:          2.290 m Patient Age:    34 years          BP:           95/60 mmHg Patient Gender: M                 HR:           126 bpm. Exam Location:  [facility] Procedure: 2D Echo Indications:    Endocarditis I38  History:        Patient has no prior history of Echocardiogram examinations.                 Risk Factors:Non-Smoker. Pneumonia due to COVID-19 virus,                 Sepsis, Lactic Acidosis.  Sonographer:    Leavy Cella RDCS (AE) Referring Phys: [id number] OLADAPO ADEFESO IMPRESSIONS  1. Left ventricular ejection fraction, by estimation, is 55 to 60%. The left ventricle has normal function. The left ventricle has no regional wall motion abnormalities. There is moderate left ventricular hypertrophy. Left  ventricular diastolic parameters are indeterminate.  2. Right ventricular systolic function is normal. The right ventricular size is normal. Tricuspid regurgitation signal is inadequate for assessing PA pressure.  3. The mitral valve is grossly normal. No evidence of mitral valve regurgitation.  4. The aortic valve is tricuspid. Aortic valve regurgitation is not visualized.  5. The inferior vena cava is normal in size with <50% respiratory variability, suggesting right atrial pressure of 8 mmHg.  6. Views are somewhat limited, but no definite valvular vegetations are visualized. FINDINGS  Left Ventricle: Left ventricular ejection fraction, by estimation, is 55 to 60%. The left ventricle has normal function. The left ventricle has no regional wall motion abnormalities. The left ventricular internal cavity size was normal in size. There is  moderate left ventricular hypertrophy. Left ventricular diastolic parameters are indeterminate. Right Ventricle: The right ventricular size is normal. No increase in  right ventricular wall thickness. Right ventricular systolic function is normal. Tricuspid regurgitation signal is inadequate for assessing PA pressure. Left Atrium: Left atrial size was normal in size. Right Atrium: Right atrial size was normal in size. Pericardium: There is no evidence of pericardial effusion. Mitral Valve: The mitral valve is grossly normal. No evidence of mitral valve regurgitation. Tricuspid Valve: The tricuspid valve is grossly normal. Tricuspid valve regurgitation is trivial. Aortic Valve: The aortic valve is tricuspid. Aortic valve regurgitation is not visualized. Pulmonic Valve: The pulmonic valve was grossly normal. Pulmonic valve regurgitation is trivial. Aorta: The aortic root is normal in size and structure. Venous: The inferior vena cava is normal in size with less than 50% respiratory variability, suggesting right atrial pressure of 8 mmHg. IAS/Shunts: No atrial level shunt detected by  color flow Doppler.  LEFT VENTRICLE PLAX 2D LVIDd:         4.37 cm Diastology LVIDs:         2.20 cm LV e' medial:    7.18 cm/s LV PW:         1.72 cm LV E/e' medial:  9.2 LV IVS:        1.46 cm LV e' lateral:   12.90 cm/s                        LV E/e' lateral: 5.1  RIGHT VENTRICLE RV S prime:     16.00 cm/s TAPSE (M-mode): 1.5 cm LEFT ATRIUM             Index       RIGHT ATRIUM           Index LA diam:        3.70 cm 1.62 cm/m  RA Area:     15.40 cm LA Vol (A2C):   63.9 ml 27.90 ml/m RA Volume:   39.80 ml  17.38 ml/m LA Vol (A4C):   40.6 ml 17.73 ml/m LA Biplane Vol: 52.4 ml 22.88 ml/m   AORTA Ao Root diam: 3.50 cm MITRAL VALVE MV Area (PHT): 6.60 cm MV Decel Time: 115 msec MV E velocity: 66.00 cm/s MV A velocity: 39.80 cm/s MV E/A ratio:  1.66 Rozann Lesches MD Electronically signed by Rozann Lesches MD Signature Date/Time: 09/30/2019/4:58:16 PM    Final     Barton Dubois, MD  [facility] Hospitalists  If 7PM-7AM, please contact night-coverage [email or web address]  10/12/2019, 3:35 PM   LOS: 14 days

## 2019-10-12 NOTE — TOC Progression Note (Signed)
Transition of Care [facility]) - Progression Note    Patient Details  Name: CANAAN HOLZER MRN: [id number] Date of Birth: [birth year]  Transition of [facility]) CM/SW Contact  Salome Arnt, [place] Phone Number: 10/12/2019, 12:00 PM  Clinical Narrative: LCSW left voicemail for Jenny Reichmann at [facility] requesting return call. LCSW then spoke with Juliann Pulse at [facility] who states pt has been in hospital long enough that he will not need COVID unit and they only have beds available on COVID unit. Referral sent to [facility]. Per Ebony Hail, facility is in network and can offer bed. LCSW udpated pt's daughter who accepts. Updated PT note in and Ebony Hail will start authorization. Per MD, pt to have port a cath placed Friday and then anticipate d/c to SNF. [facility] aware. TOC will follow.        Expected Discharge Plan: [place] Barriers to Discharge: Continued Medical Work up  Expected Discharge Plan and Services Expected Discharge Plan: [place] In-house Referral: Clinical Social Work   Post Acute Care Choice: [place] Living arrangements for the past 2 months: Apartment                                       Social Determinants of Health (SDOH) Interventions    Readmission Risk Interventions No flowsheet data found.

## 2019-10-12 NOTE — Progress Notes (Signed)
Pt complains of increased back pain in lower back. Educated pt that no pain medication available at this time. Encouraged pt to turn to side and applied heating pad. Pt admits pain relief.

## 2019-10-12 NOTE — Plan of Care (Signed)

## 2019-10-12 NOTE — Progress Notes (Signed)
Physical Therapy Treatment Patient Details Name: Stephen Wilcox MRN: [id number] DOB: [birth year] Today's Date: 10/12/2019    History of Present Illness Stephen Wilcox is a 64 y.o. male with no pertinent medical history who presents to the emergency department due to increased weakness in the setting of COVID-19 virus infection.  Patient initially presented to the ED on 09/24/2019 due to 4-day.history of low energy, lightheadedness and fatigue, with no cough or shortness of breath.  Wife was hospitalized with Covid at that time.  Patient appeared to have declined use of monoclonal antibody infusion due to mild to moderate Covid symptoms he had at that time per medical record. He now presents to the emergency department with worsening weakness, dizziness, cough (nonproductive) and decreased energy.  He denies loss of taste or smell, nausea, vomiting, abdominal pain, diarrhea or constipation.  Patient did not get any of the Covid vaccines    PT Comments    Patient presents in chair (assisted by nursing staff) and agreeable to limited therapy due to c/o severe low back pain.  Patient demonstrates slow labored movement for sit to stands and limited to a few steps at bedside due to increasing back pain with movement.  Patient demonstrates good return for bed mobility and rolling to side.  Patient will benefit from continued physical therapy in hospital and recommended venue below to increase strength, balance, endurance for safe ADLs and gait.    Follow Up Recommendations  SNF     Equipment Recommendations  None recommended by PT    Recommendations for Other Services       Precautions / Restrictions Precautions Precautions: Fall Restrictions Weight Bearing Restrictions: No    Mobility  Bed Mobility Overal bed mobility: Modified Independent                Transfers Overall transfer level: Needs assistance Equipment used: Rolling walker (2 wheeled) Transfers: Sit to/from  [facility] Sit to Stand: Min assist Stand pivot transfers: Min assist       General transfer comment: slow labored movement  Ambulation/Gait Ambulation/Gait assistance: Min assist;Mod assist Gait Distance (Feet): 5 Feet Assistive device: Rolling walker (2 wheeled) Gait Pattern/deviations: Decreased step length - right;Decreased step length - left;Decreased stride length Gait velocity: decreased   General Gait Details: limited to 4-5 steps at bedside due to c/o severe low back pain and fatigue   Stairs             Wheelchair Mobility    Modified Rankin (Stroke Patients Only)       Balance Overall balance assessment: Needs assistance Sitting-balance support: Feet supported;No upper extremity supported Sitting balance-Leahy Scale: Good Sitting balance - Comments: seated at EOB   Standing balance support: During functional activity;Bilateral upper extremity supported Standing balance-Leahy Scale: Fair Standing balance comment: using RW                            Cognition Arousal/Alertness: Awake/alert Behavior During Therapy: WFL for tasks assessed/performed;Anxious Overall Cognitive Status: Within Functional Limits for tasks assessed                                 General Comments: anxious due to severe back pain      Exercises      General Comments        Pertinent Vitals/Pain Pain Assessment: Faces Faces Pain Scale: Hurts whole lot  Pain Location: low back Pain Descriptors / Indicators: Grimacing;Guarding;Sharp;Aching Pain Intervention(s): Limited activity within patient's tolerance;Monitored during session;Repositioned;Premedicated before session    Home Living                      Prior Function            PT Goals (current goals can now be found in the care plan section) Acute Rehab PT Goals Patient Stated Goal: return home after rehab able to take of self and spouse PT Goal Formulation:  With patient Time For Goal Achievement: 10/17/19 Potential to Achieve Goals: Good Progress towards PT goals: Progressing toward goals    Frequency    Min 3X/week      PT Plan Current plan remains appropriate    Co-evaluation              AM-PAC PT "6 Clicks" Mobility   Outcome Measure  Help needed turning from your back to your side while in a flat bed without using bedrails?: None Help needed moving from lying on your back to sitting on the side of a flat bed without using bedrails?: None Help needed moving to and from a bed to a chair (including a wheelchair)?: A Little Help needed standing up from a chair using your arms (e.g., wheelchair or bedside chair)?: A Little Help needed to walk in hospital room?: A Lot Help needed climbing 3-5 steps with a railing? : A Lot 6 Click Score: 18    End of Session Equipment Utilized During Treatment: Oxygen Activity Tolerance: Patient limited by fatigue;Patient limited by pain Patient left: in bed;with call bell/phone within reach;with nursing/sitter in room Nurse Communication: Mobility status PT Visit Diagnosis: Unsteadiness on feet (R26.81);Other abnormalities of gait and mobility (R26.89);Muscle weakness (generalized) (M62.81)     Time: 6811-5726 PT Time Calculation (min) (ACUTE ONLY): 22 min  Charges:  $Therapeutic Activity: 8-22 mins                     11:40 AM, 10/12/19 Lonell Grandchild, MPT Physical Therapist with [facility] [phone] office [phone] mobile phone

## 2019-10-13 ENCOUNTER — Inpatient Hospital Stay (HOSPITAL_COMMUNITY): Payer: 59

## 2019-10-13 ENCOUNTER — Encounter (HOSPITAL_COMMUNITY): Payer: Self-pay | Admitting: Internal Medicine

## 2019-10-13 ENCOUNTER — Inpatient Hospital Stay: Payer: Self-pay

## 2019-10-13 LAB — BASIC METABOLIC PANEL
Anion gap: 11 (ref 5–15)
BUN: 26 mg/dL — ABNORMAL HIGH (ref 8–23)
CO2: 21 mmol/L — ABNORMAL LOW (ref 22–32)
Calcium: 7 mg/dL — ABNORMAL LOW (ref 8.9–10.3)
Chloride: 101 mmol/L (ref 98–111)
Creatinine, Ser: 1.77 mg/dL — ABNORMAL HIGH (ref 0.61–1.24)
GFR calc Af Amer: 46 mL/min — ABNORMAL LOW (ref >60)
GFR calc non Af Amer: 40 mL/min — ABNORMAL LOW (ref >60)
Glucose, Bld: 189 mg/dL — ABNORMAL HIGH (ref 70–99)
Potassium: 4.5 mmol/L (ref 3.5–5.1)
Sodium: 133 mmol/L — ABNORMAL LOW (ref 135–145)

## 2019-10-13 LAB — COMPREHENSIVE METABOLIC PANEL
ALT: 313 U/L — ABNORMAL HIGH (ref 0–44)
AST: 507 U/L — ABNORMAL HIGH (ref 15–41)
Albumin: 2.2 g/dL — ABNORMAL LOW (ref 3.5–5.0)
Alkaline Phosphatase: 99 U/L (ref 38–126)
Anion gap: 9 (ref 5–15)
BUN: 27 mg/dL — ABNORMAL HIGH (ref 8–23)
CO2: 22 mmol/L (ref 22–32)
Calcium: 6.8 mg/dL — ABNORMAL LOW (ref 8.9–10.3)
Chloride: 101 mmol/L (ref 98–111)
Creatinine, Ser: 2 mg/dL — ABNORMAL HIGH (ref 0.61–1.24)
GFR calc Af Amer: 40 mL/min — ABNORMAL LOW (ref >60)
GFR calc non Af Amer: 34 mL/min — ABNORMAL LOW (ref >60)
Glucose, Bld: 203 mg/dL — ABNORMAL HIGH (ref 70–99)
Potassium: 4.8 mmol/L (ref 3.5–5.1)
Sodium: 132 mmol/L — ABNORMAL LOW (ref 135–145)
Total Bilirubin: 1.2 mg/dL (ref 0.3–1.2)
Total Protein: 4.5 g/dL — ABNORMAL LOW (ref 6.5–8.1)

## 2019-10-13 LAB — CBC WITH DIFFERENTIAL/PLATELET
Band Neutrophils: 8 %
Basophils Absolute: 0 10*3/uL (ref 0.0–0.1)
Basophils Relative: 0 %
Eosinophils Absolute: 0 10*3/uL (ref 0.0–0.5)
Eosinophils Relative: 0 %
HCT: 21.6 % — ABNORMAL LOW (ref 39.0–52.0)
Hemoglobin: 6.4 g/dL — CL (ref 13.0–17.0)
Lymphocytes Relative: 0 %
Lymphs Abs: 0 10*3/uL — ABNORMAL LOW (ref 0.7–4.0)
MCH: 23.7 pg — ABNORMAL LOW (ref 26.0–34.0)
MCHC: 29.6 g/dL — ABNORMAL LOW (ref 30.0–36.0)
MCV: 80 fL (ref 80.0–100.0)
Metamyelocytes Relative: 7 %
Monocytes Absolute: 0.5 10*3/uL (ref 0.1–1.0)
Monocytes Relative: 1 %
Myelocytes: 1 %
Neutro Abs: 47.9 10*3/uL — ABNORMAL HIGH (ref 1.7–7.7)
Neutrophils Relative %: 83 %
Platelets: 142 10*3/uL — ABNORMAL LOW (ref 150–400)
RBC: 2.7 MIL/uL — ABNORMAL LOW (ref 4.22–5.81)
RDW: 22.1 % — ABNORMAL HIGH (ref 11.5–15.5)
WBC: 52.6 10*3/uL (ref 4.0–10.5)
nRBC: 0 % (ref 0.0–0.2)

## 2019-10-13 LAB — PROTIME-INR
INR: 1.8 — ABNORMAL HIGH (ref 0.8–1.2)
Prothrombin Time: 20.4 seconds — ABNORMAL HIGH (ref 11.4–15.2)

## 2019-10-13 LAB — MRSA PCR SCREENING: MRSA by PCR: NEGATIVE

## 2019-10-13 LAB — VANCOMYCIN, RANDOM: Vancomycin Rm: 7

## 2019-10-13 LAB — APTT: aPTT: 38 seconds — ABNORMAL HIGH (ref 24–36)

## 2019-10-13 LAB — LACTATE DEHYDROGENASE: LDH: 1518 U/L — ABNORMAL HIGH (ref 98–192)

## 2019-10-13 LAB — RETICULOCYTES
Immature Retic Fract: 11.7 % (ref 2.3–15.9)
RBC.: 2.74 MIL/uL — ABNORMAL LOW (ref 4.22–5.81)
Retic Count, Absolute: 72.1 10*3/uL (ref 19.0–186.0)
Retic Ct Pct: 2.6 % (ref 0.4–3.1)

## 2019-10-13 LAB — CORTISOL: Cortisol, Plasma: 29.1 ug/dL

## 2019-10-13 LAB — LACTIC ACID, PLASMA
Lactic Acid, Venous: 3.6 mmol/L (ref 0.5–1.9)
Lactic Acid, Venous: 3.2 mmol/L (ref 0.5–1.9)

## 2019-10-13 LAB — GLUCOSE, CAPILLARY
Glucose-Capillary: 197 mg/dL — ABNORMAL HIGH (ref 70–99)
Glucose-Capillary: 205 mg/dL — ABNORMAL HIGH (ref 70–99)
Glucose-Capillary: 210 mg/dL — ABNORMAL HIGH (ref 70–99)
Glucose-Capillary: 184 mg/dL — ABNORMAL HIGH (ref 70–99)

## 2019-10-13 LAB — PREPARE RBC (CROSSMATCH)

## 2019-10-13 LAB — PROCALCITONIN: Procalcitonin: 125.85 ng/mL

## 2019-10-13 MED ORDER — VANCOMYCIN HCL IN DEXTROSE 1-5 GM/200ML-% IV SOLN
1000.0000 mg | Freq: Two times a day (BID) | INTRAVENOUS | Status: DC
  Administered 2019-10-13: 1000 mg via INTRAVENOUS
  Filled 2019-10-13: qty 200

## 2019-10-13 MED ORDER — DEXTROSE 10 % IV SOLN: INTRAVENOUS | Status: DC

## 2019-10-13 MED ORDER — DILTIAZEM HCL-DEXTROSE 125-5 MG/125ML-% IV SOLN (PREMIX)
5.0000 mg/h | INTRAVENOUS | Status: DC
  Administered 2019-10-13: 5 mg/h via INTRAVENOUS
  Administered 2019-10-14 – 2019-10-15 (×5): 15 mg/h via INTRAVENOUS
  Administered 2019-10-16: 10 mg/h via INTRAVENOUS
  Administered 2019-10-16: 15 mg/h via INTRAVENOUS
  Administered 2019-10-17: 10 mg/h via INTRAVENOUS
  Filled 2019-10-13 (×12): qty 125

## 2019-10-13 MED ORDER — LACTATED RINGERS IV BOLUS
1000.0000 mL | Freq: Once | INTRAVENOUS | Status: AC
  Administered 2019-10-13: 1000 mL via INTRAVENOUS

## 2019-10-13 MED ORDER — SODIUM CHLORIDE 0.9 % IV SOLN
2.0000 g | Freq: Two times a day (BID) | INTRAVENOUS | Status: DC
  Administered 2019-10-13 – 2019-10-14 (×2): 2 g via INTRAVENOUS
  Filled 2019-10-13 (×2): qty 2

## 2019-10-13 MED ORDER — FUROSEMIDE 10 MG/ML IJ SOLN
20.0000 mg | Freq: Once | INTRAMUSCULAR | Status: AC
  Administered 2019-10-14: 20 mg via INTRAVENOUS
  Filled 2019-10-13: qty 2

## 2019-10-13 MED ORDER — SODIUM CHLORIDE 0.9 % IV SOLN
2.0000 g | Freq: Three times a day (TID) | INTRAVENOUS | Status: DC
  Administered 2019-10-13: 2 g via INTRAVENOUS
  Filled 2019-10-13: qty 2

## 2019-10-13 MED ORDER — PHENYLEPHRINE HCL-NACL 10-0.9 MG/250ML-% IV SOLN
25.0000 ug/min | INTRAVENOUS | Status: DC
  Administered 2019-10-13: 25 ug/min via INTRAVENOUS
  Administered 2019-10-13: 15 ug/min via INTRAVENOUS
  Administered 2019-10-13 (×2): 55 ug/min via INTRAVENOUS
  Administered 2019-10-13 (×2): 25 ug/min via INTRAVENOUS
  Filled 2019-10-13 (×5): qty 250

## 2019-10-13 MED ORDER — RINGERS IV SOLN: INTRAVENOUS | Status: DC

## 2019-10-13 MED ORDER — SODIUM CHLORIDE 0.9 % IV SOLN
250.0000 mL | INTRAVENOUS | Status: DC
  Administered 2019-10-21: 250 mL via INTRAVENOUS

## 2019-10-13 MED ORDER — LACTATED RINGERS IV BOLUS
500.0000 mL | Freq: Once | INTRAVENOUS | Status: AC
  Administered 2019-10-13: 500 mL via INTRAVENOUS

## 2019-10-13 MED ORDER — SODIUM CHLORIDE 0.9% FLUSH
10.0000 mL | Freq: Two times a day (BID) | INTRAVENOUS | Status: DC
  Administered 2019-10-13 – 2019-10-16 (×7): 10 mL
  Administered 2019-10-17: 20 mL
  Administered 2019-10-17 – 2019-10-19 (×4): 10 mL
  Administered 2019-10-19: 20 mL
  Administered 2019-10-20 (×2): 10 mL
  Administered 2019-10-21: 30 mL
  Administered 2019-10-21 – 2019-10-23 (×5): 10 mL
  Administered 2019-10-24: 20 mL
  Administered 2019-10-24 – 2019-10-25 (×2): 10 mL

## 2019-10-13 MED ORDER — METOPROLOL TARTRATE 5 MG/5ML IV SOLN
5.0000 mg | Freq: Once | INTRAVENOUS | Status: AC
  Administered 2019-10-13: 5 mg via INTRAVENOUS
  Filled 2019-10-13: qty 5

## 2019-10-13 MED ORDER — LACTATED RINGERS IV BOLUS (SEPSIS): 1500.0000 mL | Freq: Once | INTRAVENOUS | Status: DC

## 2019-10-13 MED ORDER — SODIUM CHLORIDE 0.9% FLUSH: 10.0000 mL | INTRAVENOUS | Status: DC | PRN

## 2019-10-13 MED ORDER — SODIUM CHLORIDE 0.9 % IV SOLN
2.0000 g | Freq: Once | INTRAVENOUS | Status: AC
  Administered 2019-10-13: 2 g via INTRAVENOUS
  Filled 2019-10-13: qty 2

## 2019-10-13 MED ORDER — VANCOMYCIN HCL IN DEXTROSE 1-5 GM/200ML-% IV SOLN: 1000.0000 mg | INTRAVENOUS | Status: DC

## 2019-10-13 MED ORDER — METRONIDAZOLE IN NACL 5-0.79 MG/ML-% IV SOLN
500.0000 mg | Freq: Three times a day (TID) | INTRAVENOUS | Status: DC
  Administered 2019-10-13 – 2019-10-16 (×11): 500 mg via INTRAVENOUS
  Filled 2019-10-13 (×11): qty 100

## 2019-10-13 MED ORDER — VITAMIN K1 10 MG/ML IJ SOLN
5.0000 mg | Freq: Once | INTRAVENOUS | Status: AC
  Administered 2019-10-13: 5 mg via INTRAVENOUS
  Filled 2019-10-13: qty 0.5

## 2019-10-13 MED ORDER — GLUCAGON HCL RDNA (DIAGNOSTIC) 1 MG IJ SOLR
1.0000 mg | Freq: Once | INTRAMUSCULAR | Status: AC
  Administered 2019-10-13: 1 mg via INTRAVENOUS
  Filled 2019-10-13: qty 1

## 2019-10-13 MED ORDER — ALBUMIN HUMAN 5 % IV SOLN
25.0000 g | Freq: Once | INTRAVENOUS | Status: AC
  Administered 2019-10-13: 25 g via INTRAVENOUS
  Filled 2019-10-13: qty 500

## 2019-10-13 MED ORDER — SODIUM CHLORIDE 0.9 % IV BOLUS
500.0000 mL | Freq: Once | INTRAVENOUS | Status: AC
  Administered 2019-10-13: 500 mL via INTRAVENOUS

## 2019-10-13 MED ORDER — LACTATED RINGERS IV SOLN: INTRAVENOUS | Status: DC

## 2019-10-13 MED ORDER — SODIUM CHLORIDE 0.9% IV SOLUTION: Freq: Once | INTRAVENOUS | Status: AC

## 2019-10-13 MED ORDER — VANCOMYCIN HCL IN DEXTROSE 1-5 GM/200ML-% IV SOLN: 1000.0000 mg | Freq: Once | INTRAVENOUS | Status: DC

## 2019-10-13 NOTE — TOC Progression Note (Signed)
Transition of Care [facility]) - Progression Note    Patient Details  Name: Stephen Wilcox MRN: [id number] Date of Birth: August 05, 1955  Transition of Care [facility]) CM/SW Contact  Salome Arnt, [place] Phone Number: 10/13/2019, 10:23 AM  Clinical Narrative:  LCSW spoke with Ebony Hail at [facility] and provided update. Confirmed facility starting auth. TOC to follow.      Expected Discharge Plan: [place] Barriers to Discharge: Continued Medical Work up  Expected Discharge Plan and Services Expected Discharge Plan: [place] In-house Referral: Clinical Social Work   Post Acute Care Choice: [place] Living arrangements for the past 2 months: Apartment                                       Social Determinants of Health (SDOH) Interventions    Readmission Risk Interventions No flowsheet data found.

## 2019-10-13 NOTE — Progress Notes (Signed)
Pt responding to bolus and initiation of neo drip. Titration so far has yielded BPs in the 90s sys.

## 2019-10-13 NOTE — Progress Notes (Signed)
Lab still unable to be obtained. Pt is difficult stick, will benefit from central line placement to help with labs, and due to the neo drip.

## 2019-10-13 NOTE — Progress Notes (Addendum)
TRH night shift.  The patient's blood pressure remains low with a systolic in the 48P/23G despite fluids and albumin.  Another LR 1000 mL bolus ordered.  I will also begin phenylephrine infusion.  Labs were not obtained earlier.  We will try to obtain them now.  Tennis Must, MD

## 2019-10-13 NOTE — Progress Notes (Addendum)
TRH night shift.  The patient was seen due to hypotension with a systolic in the 92J/19E and hypoxia in the 80s.  Earlier, he received albumin 12.5 g IVPB, 500 mL NS bolus and an ampule of dextrose 50%.  He was mildly tachypneic, but not tachycardic.  I ordered a 1000 mL LR bolus, which improved his systolic BP to the 17E.  CBC, CMP, lactic acid and procalcitonin were ordered.  Sepsis protocol antibiotics given pending procalcitonin level.  Diltiazem and metoprolol were both discontinued.  Glucagon 1 mg IVP given in the presence of hypoglycemia and hypotension in the setting of antihypertensive therapy with CCB/BB.  The patient was transferred to the ICU/SDU for closer monitoring.  Tennis Must, MD.

## 2019-10-13 NOTE — Progress Notes (Signed)
PROGRESS NOTE  Stephen Wilcox UXL:[id number] DOB: 1955-04-13 DOA: 09/28/2019 PCP: Jake Samples, PA-C  Brief History:  64 year old male without any documented significant chronic medical problems presenting to the ED on 09/24/2019 with lightheadedness malaise, and some generalized weakness.  Patient was diagnosed with positive Covid.  He had declined monoclonal antibody infusion.  On 09/28/2019, the patient presented back to the emergency department with worsening generalized weakness, dizziness, and cough and malaise.  He was noted to have oxygen saturation of 82% room air and placed on 2 L nasal cannula.  Was subsequently started on remdesivir and IV steroids.  At the time of admission, the patient was noted to have a hemoglobin of 7.3 which was a significant drop when compared to 09/24/2019 when his hemoglobin was 9.1.  Hemoccult was noted to be positive during admission.  GI was consulted to assist with management.  Dr. Laural Golden saw the patient.  He did not feel that the patient had any overt active GI bleeding.  He recommended PPI infusion for 72 hours and subsequently transitioned to oral Protonix.  He recommended endoscopic evaluation if the patient's hemoglobin continued to drop again requiring transfusion.  The patient was transfused 2 units PRBC.  His hemoglobin went up to 9.3.  The patient's oxygen demand gradually increased to 6 L, but subsequently remained stable.  Blood cultures grew Staph epidermidis in 1 of 2 sets.  This was thought to be a contaminant.  Empiric antibiotics were discontinued.  On 09/29/2019, the patient was noted to have atrial fibrillation.  EKG confirmed atrial fibrillation.  The patient was started on oral diltiazem.  Echocardiogram on 09/30/2019 showed EF 55 to 60%, no WMA.  Assessment/Plan: Acute Hypotension/elevated lactic acid/Leukocytosis -unclear if he had acute hospital acquired infection -no fever -but with elevated WBC's, lactic acid and episode of  hypoglycemia. -IVF's resuscitation given and patient started on phenylephrine -Patient transferred to the stepdown for close monitoring -Urine culture taken along with blood cultures -Broad-spectrum antibiotics started; in the setting of negative MRSA PCR vancomycin discontinued.  Patient kept On cefepime. -Follow lactic acid and renal function trend. -Renal ultrasound has been ordered.  Acute metabolic encephalopathy -In the setting of acute urinary retention -Foley catheter was placed -UA and urine culture sent for evaluation -Fluid resuscitation started -Patient mentation back to baseline and currently oriented x3.  Acute respiratory failure with hypoxia secondary to COVID-19 pneumonia -sepsis ruled out on admission -Patient was weaned off of oxygen down to 2L, until 9/22 night, when he experienced hypoxia in the 80's range. -currently 5-6L HFNC Oxygen saturation 90% -He has completed his course of steroids and remdesivir -Continue vitamin C and zinc -CRP 19.4>> 15.8>> 7.9>> 3.6>> 1.7>> 1.0>>0.8 -Ferritin 59>> 70>> 95>> 90>> 67>> 53>>50 -D-dimer 1.89>> 1.48>> 1.26>> 1.13>> 1.31>> 1.48>>1.71 -10/03/19 CTA chest--no PE; 4.8 cm lesion in the right lobe liver, incompletely characterized. 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 Delton Coombes who will follow him up as an outpatient -Requested that CT abdomen and pelvis with contrast be performed for to complete  staging process -Also requested possible port  placement by general surgery for chemotherapy -Discussed with Dr. Arnoldo Morale and after decompensated event overnight will place port a cath placement on hold. -Regarding nutrition, will follow feeding supplements recommended by dietitian; continue calorie counting. -If patient is unable to maintain 1800 -calorie/day intake on full liquid diet, may need to consider PEG tube placement to supplement nutrition. -Follow patient in stabilization; at this time PICC line has been ordered.  Symptomatic anemia/heme positive stool -Appreciate GI consult -Dr. Laural Golden followed the patient--> recommended IV Protonix then transition to p.o. Protonix -Transfused 4 units PRBC during this hospitalization; last 2 units ordered on 10/13/2019 in the setting of acute drop in his hemoglobin count; part of these secondary to hemodilution after aggressive fluid resuscitation).. -in retrospect, gastric mass may have been source of bleed -Continue to follow hemoglobin trend and further transfuse as needed for hemoglobin less than 7.  New onset atrial fibrillation -In the setting of anemia and hypertension once again patient has jumped into atrial fibrillation -Previously Cardizem and metoprolol discontinue due to hypotensive event. -1 dose of Lopressor provided without resolution of his A. fib with RVR. -Cardizem drip initiated. -09/30/2019 echo EF 55 to 60%, no WMA -CHA2DS2-VASc = 1 -TSH--0.733 -Will continue holding on anticoagulation/aspirin in light of high risk for bleeding from gastric lesion.  Diabetes mellitus type 2 -09/28/2019 hemoglobin A1c 6.9 -Initially having episodes of hyperglycemia due to steroids -Blood sugars have trended down since steroids have been discontinued and overnight experienced 2 episodes of hypoglycemia; after eating and transient use of D10 infusion blood sugar within normal limits -Checking cortisol level -Follow CBGs.. -Holding a nightly sliding scale insulin at this  time.  Microcytic anemia/Iron deficiency -iron saturation 3% -ferritin low despite COVID-19 -Case discussed with Dr. Delton Coombes who has recommended initiation of Niferex -Once medically stable IV iron infusion also recommended. -Continue transfusion as needed.  Thrombocytosis -due to iron deficiency and acute medical illness -Continue to follow platelets count intermittently.  Class II obesity -BMI 32.14 -Lifestyle modification, portion control, low calorie diet recommended.  Acute kidney injury -With concern for obstructive uropathy -Foley catheter placed -Urine culture and renal ultrasound has been ordered. -Continue IV fluids; minimize the use of nephrotoxic agents and avoid hypotension. -Follow renal function trend.  BPH/urinary retention -Continue tamsulosin -Foley catheter placed -Gentle fluid resuscitation -Follow clinical response.  Bacteremia -Staph epidermidis 1 out of 2 sets -Represents contaminant -Due to the overnight; repeat blood culture has been ordered.  Generalized weakness -PT evaluation--> skilled nursing facility with which the patient and daughter agree.  Status is: Inpatient  Remains inpatient appropriate because: IV treatments appropriate due to intensity of illness or inability to take PO   Dispo: The patient is from: Home  Anticipated d/c is to: SNF  Anticipated d/c date is: to be determined  Patient currently is not medically stable to d/c.  After discussion with general surgery plan is for Port-A-Cath placement on 10/14/2019.  Patient will follow up with Dr. Delton Coombes as an outpatient for initiation of chemotherapy.  Due to acute urinary retention and acute kidney injury, will continue IVF's, follow renal function trend and check renal US. Due to a. Fib with RVR and also low BP will continue phylephrine and cardizem drip. Patient afebrile. Continue weaning O2 as tolerated.    Family  Communication:   Daughter updated 9/21  Consultants:  GI--Rehman  Code Status:  FULL   DVT Prophylaxis:  SCDs   Procedures: As Listed in Progress Note Above  Antibiotics: None  Follow facility subjective: No chest pain, no nausea, no vomiting, no headaches.  Patient mentation back to normal and currently oriented x3.  Overnight with acute event of hypertension and hypoxia.  Currently experiencing A. fib with RVR and requiring pressures.  No fever.  Feeling weak and deconditioned.  Objective: Vitals:   10/13/19 1500 10/13/19 1515 10/13/19 1530 10/13/19 1545  BP: (!) 118/53 (!) 129/58  (!) 119/51  Pulse:  86 87 85  Resp: 15 17 (!) 24 18  Temp:      TempSrc:      SpO2:  94% 94% 97%  Weight:      Height:        Intake/Output Summary (Last 24 hours) at 10/13/2019 1301 Last data filed at 10/13/2019 1000 Gross per 24 hour  Intake 4641.39 ml  Output 600 ml  Net 4041.39 ml   Weight change:   Exam: General exam: Alert, awake, oriented x 3; denies chest pain, no nausea, no vomiting.  Requiring higher level of oxygen supplementation (up to 5 L high flow nasal cannula).  Blood pressure remains soft and patient is requiring pressor support. Respiratory system: No wheezing, positive scattered rhonchi, positive tachypnea with minimal exertion.  5 L nasal cannula supplementation in place. Cardiovascular system: Irregular irregular, no rubs, no gallops, no JVD on exam. Gastrointestinal system: Abdomen is nondistended, soft and without guarding. No organomegaly or masses felt. Normal bowel sounds heard. Central nervous system: Alert and oriented. No focal neurological deficits. Extremities: No cyanosis or clubbing. Skin: No rashes, no petechiae. Psychiatry: Judgement and insight appear normal.  Flat affect and depressed mood appreciated.  No suicidal ideation or hallucination.   Data Reviewed: I have personally reviewed following labs and imaging studies  Basic Metabolic  Panel: Recent Labs  Lab 10/07/19 0845 10/11/19 0653 10/12/19 0611 10/13/19 0911  NA 137 134* 131* 132*  K 4.4 4.3 4.7 4.8  CL 101 103 97* 101  CO2 28 26 26 22   GLUCOSE 110* 72 138* 203*  BUN 30* 22 20 27*  CREATININE 0.73 0.64 1.51* 2.00*  CALCIUM 7.5* 7.4* 7.7* 6.8*   Liver Function Tests: Recent Labs  Lab 10/07/19 0845 10/12/19 0611 10/13/19 0911  AST 26 30 507*  ALT 35 39 313*  ALKPHOS 52 69 99  BILITOT 0.7 0.8 1.2  PROT 5.0* 4.6* 4.5*  ALBUMIN 2.3* 2.1* 2.2*   CBC: Recent Labs  Lab 10/07/19 0845 10/10/19 0733 10/11/19 0653 10/12/19 0611 10/13/19 0911  WBC 15.2* 13.9* 13.6* 15.3* 52.6*  NEUTROABS 13.8*  --   --   --  47.9*  HGB 8.7* 7.7* 7.6* 7.8* 6.4*  HCT 30.6* 26.9* 26.0* 27.0* 21.6*  MCV 80.7 80.3 80.7 80.1 80.0  PLT 311 266 241 264 142*   CBG: Recent Labs  Lab 10/12/19 2135 10/12/19 2251 10/12/19 2323 10/13/19 0811 10/13/19 1146  GLUCAP 70 68* 95 197* 205*   Urine analysis:    Component Value Date/Time   COLORURINE YELLOW 10/12/2019 1544   APPEARANCEUR HAZY (A) 10/12/2019 1544   LABSPEC 1.014 10/12/2019 1544   PHURINE 5.0 10/12/2019 1544   GLUCOSEU NEGATIVE 10/12/2019 1544   HGBUR MODERATE (A) 10/12/2019 1544   BILIRUBINUR NEGATIVE 10/12/2019 1544   KETONESUR NEGATIVE 10/12/2019 1544   PROTEINUR NEGATIVE 10/12/2019 1544   NITRITE NEGATIVE 10/12/2019 1544   LEUKOCYTESUR NEGATIVE 10/12/2019 1544   Sepsis Labs: Recent Results (from the past 240 hour(s))  MRSA PCR Screening     Status: None   Collection  Time: 10/13/19  4:08 AM   Specimen: Nasopharyngeal  Result Value Ref Range Status   MRSA by PCR NEGATIVE NEGATIVE Final    Comment:        The GeneXpert MRSA Assay (FDA approved for NASAL specimens only), is one component of a comprehensive MRSA colonization surveillance program. It is not intended to diagnose MRSA infection nor to guide or monitor treatment for MRSA infections. Performed at [facility], [address]  [place], [place] [address]      Scheduled Meds: . sodium chloride   Intravenous Once  . sodium chloride   Intravenous Once  . vitamin C  500 mg Oral Daily  . busPIRone  5 mg Oral TID  . Chlorhexidine Gluconate Cloth  6 each Topical Daily  . furosemide  20 mg Intravenous Once  . hyoscyamine  0.25 mg Sublingual TID AC  . insulin aspart  0-20 Units Subcutaneous TID WC  . insulin aspart  0-5 Units Subcutaneous QHS  . iron polysaccharides  150 mg Oral Daily  . pantoprazole  40 mg Oral BID AC  . tamsulosin  0.4 mg Oral QPC supper  . zinc sulfate  220 mg Oral Daily   Continuous Infusions: . sodium chloride    . ceFEPime (MAXIPIME) IV    . dextrose Stopped (10/13/19 0941)  . metronidazole Stopped (10/13/19 7867)  . phenylephrine (NEO-SYNEPHRINE) Adult infusion 45 mcg/min (10/13/19 1152)  . phytonadione (VITAMIN K) IV      Procedures/Studies: DG Chest 2 View  Result Date: 09/15/2019 CLINICAL DATA:  Acute chest pain. EXAM: CHEST - 2 VIEW COMPARISON:  None. FINDINGS: The heart size and mediastinal contours are within normal limits. No pneumothorax or pleural effusion is noted. Right lung is clear. Minimal left basilar subsegmental atelectasis or scarring is noted. The visualized skeletal structures are unremarkable. IMPRESSION: Minimal left basilar subsegmental atelectasis or scarring. Electronically Signed   By: Marijo Conception M.D.   On: 09/15/2019 16:58   DG Ribs Unilateral Right  Result Date: 09/15/2019 CLINICAL DATA:  Acute right chest pain. EXAM: RIGHT RIBS - 2 VIEW COMPARISON:  None. FINDINGS: No fracture or other bone lesions are seen involving the ribs. IMPRESSION: Negative. Electronically Signed   By: Marijo Conception M.D.   On: 09/15/2019 16:57   CT ANGIO CHEST PE W OR WO CONTRAST  Result Date: 10/03/2019 CLINICAL DATA:  COVID-19 positive, weakness, dizziness and shortness of breath EXAM: CT ANGIOGRAPHY CHEST WITH CONTRAST TECHNIQUE: Multidetector CT imaging of the chest was performed  using the standard protocol during bolus administration of intravenous contrast. Multiplanar CT image reconstructions and MIPs were obtained to evaluate the vascular anatomy. CONTRAST:  171mL OMNIPAQUE IOHEXOL 350 MG/ML SOLN COMPARISON:  Radiograph 09/28/2019, CT abdomen pelvis 08/15/2018 FINDINGS: Cardiovascular: Satisfactory opacification the pulmonary arteries to the segmental level. No pulmonary artery filling defects are identified. Central pulmonary arteries are normal caliber. Suboptimal opacification of the thoracic aorta. No discernible acute abnormality of the thoracic aorta proximal great vessels. Aberrant right subclavian artery. Borderline cardiac enlargement. Slight reflux of contrast into the IVC. Mediastinum/Nodes: No mediastinal fluid or gas. Normal thyroid gland and thoracic inlet. No acute abnormality of the trachea or esophagus. No worrisome mediastinal, hilar or axillary adenopathy. Lungs/Pleura: Mixed areas of heterogeneous consolidative and ground-glass opacity throughout both lungs with a basilar and peripheral predominance. No pneumothorax or visible effusion. Diffuse airways thickening. Upper Abdomen: Indeterminate intermediate attenuation (38 HU) lesion measuring up to 8.5 cm in maximum transaxial dimension within the right lobe  liver (4/80). Cholelithiasis with a partially calcified gallstone towards the gallbladder neck. Multiple soft tissue attenuation lesions present along the lesser curvature/gastrohepatic ligament. Largest measuring up to 4.6 cm in size (4/91). Partially exophytic 0.7 cm lesion arising from the upper pole left kidney, corresponding with a previously seen fluid attenuation cyst on prior comparison CT, could reflect intracystic hemorrhage/proteinaceous debris. Musculoskeletal: Multilevel degenerative changes are present in the imaged portions of the spine. No acute osseous abnormality or suspicious osseous lesion. Probable intramuscular lipoma of the right  infraspinatus measuring up to 6.9 by 3.5 cm (4/20). No other acute or worrisome chest wall lesions. Review of the MIP images confirms the above findings. IMPRESSION: 1. No evidence of acute pulmonary artery filling defects. 2. Mixed areas of heterogeneous consolidative and ground-glass opacity throughout both lungs with a basilar and peripheral predominance. Findings are compatible with multifocal pneumonia compatible with a COVID-19 etiology. 3. Indeterminate 4.8 cm lesion in the right lobe liver, incompletely characterized. Additional multiple soft tissue attenuation lesions along the lesser curvature/gastrohepatic ligament, largest measuring up to 4.6 cm in size. These findings are new from comparison abdominal CT 08/15/2018 and could raise concern for potential malignancy. Could consider further evaluation with abdominal MR with contrast. 4. Additional 7 mm hyperdense lesion arising from the upper pole left kidney, could reflect proteinaceous cysts, Ob also be better evaluated on dedicated abdominal imaging. 5. Cholelithiasis. 6. Aberrant right subclavian artery. These results will be called to the ordering clinician or representative by the Radiologist Assistant, and communication documented in the PACS or [employer]. Electronically Signed   By: Lovena Le M.D.   On: 10/03/2019 15:38   CT ABDOMEN PELVIS W CONTRAST  Result Date: 10/11/2019 CLINICAL DATA:  COVID positive. EXAM: CT ABDOMEN AND PELVIS WITH CONTRAST TECHNIQUE: Multidetector CT imaging of the abdomen and pelvis was performed using the standard protocol following bolus administration of intravenous contrast. CONTRAST:  23mL OMNIPAQUE IOHEXOL 300 MG/ML  SOLN COMPARISON:  August 15, 2018 FINDINGS: Lower chest: Marked severity bilateral lower lobe infiltrates are seen. Hepatobiliary: Multiple large heterogeneous low-attenuation liver masses are seen. The largest measures approximately 6.0 cm x 5.3 cm x 6.1 cm. A 1.7 cm gallstone is seen within  the neck of an otherwise normal-appearing gallbladder. Pancreas: Unremarkable. No pancreatic ductal dilatation or surrounding inflammatory changes. Spleen: Normal in size without focal abnormality. Adrenals/Urinary Tract: Adrenal glands are unremarkable. Kidneys are normal in size, without renal calculi or hydronephrosis. A stable 1.2 cm diameter area of heterogeneous low attenuation is seen within the medial aspect of the mid to lower right kidney. A stable 1.2 cm cyst is seen along the posterior aspect of the upper pole of the left kidney. Bladder is unremarkable. Stomach/Bowel: A 3.0 cm x 8.7 cm x 6.3 cm heterogeneous soft tissue mass is seen extending from the wall of the lesser sac of the stomach into the gastric lumen (axial CT images 15 through 25, CT series number 2). Appendix appears normal. There is no evidence of bowel dilatation. Numerous diverticula are seen throughout the descending and sigmoid colon. Moderate severity thickening of the proximal sigmoid colon is noted (best seen on coronal reformatted images 43 through 50, CT series number 5). Vascular/Lymphatic: No significant vascular findings are present. Reproductive: The prostate gland is markedly enlarged. Other: Multiple round heterogeneous soft tissue masses of various sizes are seen within the upper abdomen, adjacent to the lesser sac of the stomach. The largest measures approximately 5.2 cm x 5.6 cm x 4.6 cm. Musculoskeletal: Multilevel  degenerative changes are seen throughout the lumbar spine. IMPRESSION: 1. Marked severity bilateral lower lobe infiltrates. 2. Multiple large heterogeneous low-attenuation liver masses, consistent with metastatic disease. 3. Large gastric mass, suspicious for primary neoplasm. 4. Multiple soft tissue masses within the mesentery of the upper abdomen, adjacent to the lesser sac of the stomach, consistent with metastatic disease. 5. Colonic diverticulosis. 6. Thickening of the proximal sigmoid colon which may  represent sequelae associated with mild colitis/diverticulitis correlation with follow-up abdomen pelvis CT is recommended to exclude the presence of an underlying neoplasm. 7. Stable area of low attenuation within the right kidney. While this may represent a hemorrhagic cyst, correlation with renal ultrasound is recommended. 8. Cholelithiasis. Electronically Signed   By: Virgina Norfolk M.D.   On: 10/11/2019 23:24   MR LIVER W WO CONTRAST  Result Date: 10/04/2019 CLINICAL DATA:  Evaluate liver lesions and upper abdominal adenopathy seen on recent chest CT. EXAM: MRI ABDOMEN WITHOUT AND WITH CONTRAST TECHNIQUE: Multiplanar multisequence MR imaging of the abdomen was performed both before and after the administration of intravenous contrast. CONTRAST:  37mL GADAVIST GADOBUTROL 1 MMOL/ML IV SOLN COMPARISON:  CT abdomen/pelvis 08/15/2018 FINDINGS: Lower chest: The lungs demonstrate changes of COVID pneumonia as demonstrated on today's chest CT. Hepatobiliary: Numerous diffusion positive hepatic metastatic lesions are noted. Segment 4A lesion measures 3.7 cm on image 7/8. 5 cm lesion and segment 7 on image 12/8. 5 cm lesion in segment 6 on image 18/8. Several other smaller lesions are noted. No intrahepatic biliary dilatation. There is diffuse fatty infiltration of the liver noted. A 2 cm gallstone is noted in the gallbladder. No common bile duct dilatation. Pancreas:  No mass, inflammation or ductal dilatation. Spleen:  Normal size. No focal lesions. Adrenals/Urinary Tract: The adrenal glands and kidneys are unremarkable. Stomach/Bowel: There is a large infiltrating gastric mass involving the fundal region 6.5 x 4.8 cm and is diffusion positive. Moderate contrast enhancement is noted. Adjacent gastrohepatic ligament lymphadenopathy with the largest node measuring 4.6 cm. Vascular/Lymphatic: The aorta is normal in caliber. No dissection. The branch vessels are patent. No retroperitoneal lymphadenopathy. Other:  No  ascites or abdominal wall hernia. Musculoskeletal: No worrisome bone lesions. IMPRESSION: 1. 6.5 x 4.8 cm infiltrating gastric mass involving the fundal region. Associated gastrohepatic ligament lymphadenopathy. 2. Numerous hepatic metastatic lesions. 3. Cholelithiasis. Electronically Signed   By: Marijo Sanes M.D.   On: 10/04/2019 06:26   DG Chest Port 1 View  Result Date: 09/28/2019 CLINICAL DATA:  COVID EXAM: PORTABLE CHEST 1 VIEW COMPARISON:  09/24/2019 FINDINGS: Development of patchy bilateral airspace opacities. No pleural effusion. Stable cardiomediastinal silhouette. No pneumothorax. IMPRESSION: Development of patchy bilateral airspace opacities, consistent with bilateral pneumonia. Electronically Signed   By: Donavan Foil M.D.   On: 09/28/2019 23:14   DG Chest Port 1 View  Result Date: 09/24/2019 CLINICAL DATA:  COVID pneumonia, weakness, fatigue EXAM: PORTABLE CHEST 1 VIEW COMPARISON:  09/15/2019 FINDINGS: The lungs are symmetrically expanded. Minimal left basilar atelectasis is again noted. No superimposed confluent pulmonary infiltrate. No pneumothorax or pleural effusion. Cardiac size within normal limits. The pulmonary vascularity is normal. No acute bone abnormality. IMPRESSION: Minimal left basilar atelectasis, unchanged. No superimposed confluent pulmonary infiltrate. Electronically Signed   By: Fidela Salisbury MD   On: 09/24/2019 20:15   ECHOCARDIOGRAM COMPLETE  Result Date: 09/30/2019    ECHOCARDIOGRAM REPORT   Patient Name:   JAEDAN HUTTNER Date of Exam: 09/30/2019 Medical Rec #:  [id number]  Height:       72.0 in Accession #:    [id number]        Weight:       237.0 lb Date of Birth:  [birth year]        BSA:          2.290 m Patient Age:    89 years          BP:           95/60 mmHg Patient Gender: M                 HR:           126 bpm. Exam Location:  [facility] Procedure: 2D Echo Indications:    Endocarditis I38  History:        Patient has no prior history of  Echocardiogram examinations.                 Risk Factors:Non-Smoker. Pneumonia due to COVID-19 virus,                 Sepsis, Lactic Acidosis.  Sonographer:    Leavy Cella RDCS (AE) Referring Phys: [id number] OLADAPO ADEFESO IMPRESSIONS  1. Left ventricular ejection fraction, by estimation, is 55 to 60%. The left ventricle has normal function. The left ventricle has no regional wall motion abnormalities. There is moderate left ventricular hypertrophy. Left ventricular diastolic parameters are indeterminate.  2. Right ventricular systolic function is normal. The right ventricular size is normal. Tricuspid regurgitation signal is inadequate for assessing PA pressure.  3. The mitral valve is grossly normal. No evidence of mitral valve regurgitation.  4. The aortic valve is tricuspid. Aortic valve regurgitation is not visualized.  5. The inferior vena cava is normal in size with <50% respiratory variability, suggesting right atrial pressure of 8 mmHg.  6. Views are somewhat limited, but no definite valvular vegetations are visualized. FINDINGS  Left Ventricle: Left ventricular ejection fraction, by estimation, is 55 to 60%. The left ventricle has normal function. The left ventricle has no regional wall motion abnormalities. The left ventricular internal cavity size was normal in size. There is  moderate left ventricular hypertrophy. Left ventricular diastolic parameters are indeterminate. Right Ventricle: The right ventricular size is normal. No increase in right ventricular wall thickness. Right ventricular systolic function is normal. Tricuspid regurgitation signal is inadequate for assessing PA pressure. Left Atrium: Left atrial size was normal in size. Right Atrium: Right atrial size was normal in size. Pericardium: There is no evidence of pericardial effusion. Mitral Valve: The mitral valve is grossly normal. No evidence of mitral valve regurgitation. Tricuspid Valve: The tricuspid valve is grossly normal.  Tricuspid valve regurgitation is trivial. Aortic Valve: The aortic valve is tricuspid. Aortic valve regurgitation is not visualized. Pulmonic Valve: The pulmonic valve was grossly normal. Pulmonic valve regurgitation is trivial. Aorta: The aortic root is normal in size and structure. Venous: The inferior vena cava is normal in size with less than 50% respiratory variability, suggesting right atrial pressure of 8 mmHg. IAS/Shunts: No atrial level shunt detected by color flow Doppler.  LEFT VENTRICLE PLAX 2D LVIDd:         4.37 cm Diastology LVIDs:         2.20 cm LV e' medial:    7.18 cm/s LV PW:         1.72 cm LV E/e' medial:  9.2 LV IVS:        1.46 cm LV e' lateral:  12.90 cm/s                        LV E/e' lateral: 5.1  RIGHT VENTRICLE RV S prime:     16.00 cm/s TAPSE (M-mode): 1.5 cm LEFT ATRIUM             Index       RIGHT ATRIUM           Index LA diam:        3.70 cm 1.62 cm/m  RA Area:     15.40 cm LA Vol (A2C):   63.9 ml 27.90 ml/m RA Volume:   39.80 ml  17.38 ml/m LA Vol (A4C):   40.6 ml 17.73 ml/m LA Biplane Vol: 52.4 ml 22.88 ml/m   AORTA Ao Root diam: 3.50 cm MITRAL VALVE MV Area (PHT): 6.60 cm MV Decel Time: 115 msec MV E velocity: 66.00 cm/s MV A velocity: 39.80 cm/s MV E/A ratio:  1.66 Rozann Lesches MD Electronically signed by Rozann Lesches MD Signature Date/Time: 09/30/2019/4:58:16 PM    Final    [place] EKG SITE RITE  Result Date: 10/13/2019 If Site Rite image not attached, placement could not be confirmed due to current cardiac rhythm.   Barton Dubois, MD  [facility]  If 7PM-7AM, please contact night-coverage [email or web address]  10/13/2019, 1:01 PM   LOS: 15 days

## 2019-10-13 NOTE — Progress Notes (Signed)
Dr. Dyann Kief made aware repeat lactic is 3.2

## 2019-10-13 NOTE — Progress Notes (Signed)
Pt transferred to ICU bed 8. Pt alert and aware of transfer. Report given to [place], Therapist, sports.

## 2019-10-13 NOTE — Progress Notes (Signed)
Pt noted to be in a fib rate 130-160. 5 lopressor given per MD order. No change noted to rate. Neo restarted and cardizem GTT started per MD order. PICC rn to unit to place PICC line.

## 2019-10-13 NOTE — Consult Note (Signed)
Events of last night noted.  Port-A-Cath originally scheduled for tomorrow canceled at this point.  Do recommend PICC line insertion if patient continuing on pressor support.

## 2019-10-13 NOTE — Progress Notes (Signed)
PT Cancellation Note  Patient Details Name: Stephen Wilcox MRN: [id number] DOB: 1955/04/06   Cancelled Treatment:    Reason Eval/Treat Not Completed: Medical issues which prohibited therapy.  Patient transferred to a higher level of care and will need new PT consult resume therapy when patient is medically stable.  Thank you.   7:36 AM, 10/13/19 Lonell Grandchild, MPT Physical Therapist with [facility] [phone] office [phone] mobile phone

## 2019-10-13 NOTE — Progress Notes (Signed)
Notified Dr Olevia Bowens of recent events to manage BP with no significant response. New orders placed and will transfer to stepdown for closer monitoring and treatment of BP. Pt aware and in agreement of transfer.

## 2019-10-13 NOTE — Progress Notes (Signed)
CRITICAL VALUE ALERT  Critical Value:  WBC: 52.6 & Hgb: 6.4  Date & Time Notied:  1056  Provider Notified: Dr. Dyann Kief

## 2019-10-13 NOTE — Progress Notes (Addendum)
Aware of PICC order and cancellation of implanted port. Will try to place PICC late this evening or tomorrow. Patient has 2 good IVs.

## 2019-10-13 NOTE — Progress Notes (Signed)
Pharmacy Antibiotic Note  Stephen Wilcox is a 64 y.o. male admitted on 09/28/2019 with acute respiratory failure in the setting of COVID-19.  Pharmacy has been consulted for Vancomycin/Cefepime dosing. WBC 15.3. Pt having some refractory hypotension.   Plan: Vancomycin 1000 mg IV q12h Cefepime 2g IV q8h Flagyl per MD Trend WBC, temp, renal function  F/U infectious work-up Drug levels as indicated   Height: 6' (182.9 cm) Weight: 107.5 kg (237 lb) IBW/kg (Calculated) : 77.6  Temp (24hrs), Avg:98.5 F (36.9 C), Min:97.7 F (36.5 C), Max:98.9 F (37.2 C)  Recent Labs  Lab 10/06/19 0716 10/07/19 0845 10/10/19 0733 10/11/19 0653 10/12/19 0611  WBC 13.0* 15.2* 13.9* 13.6* 15.3*  CREATININE 0.85 0.73  --  0.64 1.51*    Estimated Creatinine Clearance: 62.6 mL/min (A) (by C-G formula based on SCr of 1.51 mg/dL (H)).    No Known Allergies  Narda Bonds, PharmD, BCPS Clinical Pharmacist Phone: [phone]

## 2019-10-13 NOTE — Progress Notes (Signed)
MD made aware of RBC transfusion delay.

## 2019-10-13 NOTE — Progress Notes (Signed)
Peripherally Inserted Central Catheter Placement  The IV Nurse has discussed with the patient and/or persons authorized to consent for the patient, the purpose of this procedure and the potential benefits and risks involved with this procedure.  The benefits include less needle sticks, lab draws from the catheter, and the patient may be discharged home with the catheter. Risks include, but not limited to, infection, bleeding, blood clot (thrombus formation), and puncture of an artery; nerve damage and irregular heartbeat and possibility to perform a PICC exchange if needed/ordered by physician.  Alternatives to this procedure were also discussed.  Bard Power PICC patient education guide, fact sheet on infection prevention and patient information card has been provided to patient /or left at bedside.    PICC Placement Documentation  PICC Double Lumen 79/39/03 PICC Left Basilic 49 cm 0 cm (Active)  Indication for Insertion or Continuance of Line Poor Vasculature-patient has had multiple peripheral attempts or PIVs lasting less than 24 hours 10/13/19 1927  Exposed Catheter (cm) 0 cm 10/13/19 1927  Site Assessment Clean;Dry;Intact 10/13/19 1927  Lumen #1 Status Flushed;Blood return noted;Saline locked 10/13/19 1927  Lumen #2 Status Flushed;Blood return noted;Saline locked 10/13/19 1927  Dressing Type Transparent 10/13/19 1927  Dressing Status Clean;Dry;Intact 10/13/19 1927  Dressing Change Due 10/20/19 10/13/19 1927       Scotty Court 10/13/2019, 7:42 PM

## 2019-10-14 ENCOUNTER — Encounter (HOSPITAL_COMMUNITY)
Admission: EM | Disposition: A | Discharge status: Hospice | Payer: Self-pay | Source: Home / Self Care | Attending: Family Medicine

## 2019-10-14 LAB — CBC
HCT: 25.5 % — ABNORMAL LOW (ref 39.0–52.0)
Hemoglobin: 8.1 g/dL — ABNORMAL LOW (ref 13.0–17.0)
MCH: 25.6 pg — ABNORMAL LOW (ref 26.0–34.0)
MCHC: 31.8 g/dL (ref 30.0–36.0)
MCV: 80.4 fL (ref 80.0–100.0)
Platelets: 97 10*3/uL — ABNORMAL LOW (ref 150–400)
RBC: 3.17 MIL/uL — ABNORMAL LOW (ref 4.22–5.81)
RDW: 20.1 % — ABNORMAL HIGH (ref 11.5–15.5)
WBC: 32.1 10*3/uL — ABNORMAL HIGH (ref 4.0–10.5)
nRBC: 0 % (ref 0.0–0.2)

## 2019-10-14 LAB — URINE CULTURE: Culture: >=100000 — AB

## 2019-10-14 LAB — GLUCOSE, CAPILLARY
Glucose-Capillary: 233 mg/dL — ABNORMAL HIGH (ref 70–99)
Glucose-Capillary: 224 mg/dL — ABNORMAL HIGH (ref 70–99)
Glucose-Capillary: 188 mg/dL — ABNORMAL HIGH (ref 70–99)
Glucose-Capillary: 149 mg/dL — ABNORMAL HIGH (ref 70–99)

## 2019-10-14 SURGERY — INSERTION, TUNNELED CENTRAL VENOUS DEVICE, WITH PORT
Anesthesia: Monitor Anesthesia Care | Laterality: Left

## 2019-10-14 MED ORDER — ONDANSETRON HCL 4 MG/2ML IJ SOLN
4.0000 mg | Freq: Four times a day (QID) | INTRAMUSCULAR | Status: DC | PRN
  Administered 2019-10-14 – 2019-10-16 (×2): 4 mg via INTRAVENOUS
  Filled 2019-10-14: qty 2

## 2019-10-14 MED ORDER — SODIUM CHLORIDE 0.9 % IV SOLN
2.0000 g | INTRAVENOUS | Status: AC
  Administered 2019-10-14 – 2019-10-20 (×7): 2 g via INTRAVENOUS
  Filled 2019-10-14 (×7): qty 20

## 2019-10-14 MED ORDER — DIGOXIN 125 MCG PO TABS
0.1250 mg | ORAL_TABLET | Freq: Every day | ORAL | Status: DC
  Administered 2019-10-15 – 2019-10-19 (×5): 0.125 mg via ORAL
  Filled 2019-10-14 (×6): qty 1

## 2019-10-14 MED ORDER — DIGOXIN 125 MCG PO TABS
0.2500 mg | ORAL_TABLET | Freq: Four times a day (QID) | ORAL | Status: AC
  Administered 2019-10-14: 0.25 mg via ORAL
  Filled 2019-10-14: qty 2

## 2019-10-14 NOTE — Progress Notes (Signed)
PROGRESS NOTE  Stephen Wilcox QAS:[id number] DOB: 1955-12-18 DOA: 09/28/2019 PCP: Jake Samples, PA-C  Brief History:  64 year old male without any documented significant chronic medical problems presenting to the ED on 09/24/2019 with lightheadedness malaise, and some generalized weakness.  Patient was diagnosed with positive Covid.  He had declined monoclonal antibody infusion.  On 09/28/2019, the patient presented back to the emergency department with worsening generalized weakness, dizziness, and cough and malaise.  He was noted to have oxygen saturation of 82% room air and placed on 2 L nasal cannula.  Was subsequently started on remdesivir and IV steroids.  At the time of admission, the patient was noted to have a hemoglobin of 7.3 which was a significant drop when compared to 09/24/2019 when his hemoglobin was 9.1.  Hemoccult was noted to be positive during admission.  GI was consulted to assist with management.  Dr. Laural Golden saw the patient.  He did not feel that the patient had any overt active GI bleeding.  He recommended PPI infusion for 72 hours and subsequently transitioned to oral Protonix.  He recommended endoscopic evaluation if the patient's hemoglobin continued to drop again requiring transfusion.  The patient was transfused 2 units PRBC.  His hemoglobin went up to 9.3.  The patient's oxygen demand gradually increased to 6 L, but subsequently remained stable.  Blood cultures grew Staph epidermidis in 1 of 2 sets.  This was thought to be a contaminant.  Empiric antibiotics were discontinued.  On 09/29/2019, the patient was noted to have atrial fibrillation.  EKG confirmed atrial fibrillation.  The patient was started on oral diltiazem.  Echocardiogram on 09/30/2019 showed EF 55 to 60%, no WMA.  Assessment/Plan: Acute Hypotension/elevated lactic acid/Leukocytosis -unclear if he had acute hospital acquired infection -no fever -but with elevated WBC's, lactic acid and episode of  hypoglycemia. -Hemodynamic instability persist-continue IV phenylephrine and IV fluids for pressure support --Patient transferred to the stepdown for close monitoring -Urine culture from 10/12/2019 with E. coli that is resistant to penicillins, okay to de-escalate from Vanco and cefepime to Rocephin -Blood cultures from 10/13/2019 NGTD - negative MRSA PCR -on 10/13/19 Clinically patient meets criteria for severe E. coli sepsis with hemodynamic instability as above -Lactic acid peaked at 3.6 PCT 125.85 WBC down to 32.1 from 79.8  Acute metabolic encephalopathy -In the setting of acute urinary retention -Foley catheter was placed -UA and urine culture sent for evaluation -Fluid resuscitation started -Patient mentation back to baseline and currently oriented x3.  Acute respiratory failure with hypoxia secondary to COVID-19 pneumonia -Patient was weaned off of oxygen down to 2L, until 9/22 night, when he experienced hypoxia in the 80's range. -currently 5-6L HFNC Oxygen saturation 90% -He has completed his course of steroids and remdesivir -Continue vitamin C and zinc -CRP 19.4>> 15.8>> 7.9>> 3.6>> 1.7>> 1.0>>0.8 -Ferritin 59>> 70>> 95>> 90>> 67>> 53>>50 -D-dimer 1.89>> 1.48>> 1.26>> 1.13>> 1.31>> 1.48>>1.71 -10/03/19 CTA chest--no PE; 4.8 cm lesion in the right lobe liver, incompletely characterized. 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 Delton Coombes  who will follow him up as an outpatient -Requested that CT  abdomen and pelvis with contrast be performed for to complete staging process -Also requested possible port placement by general surgery for chemotherapy -Discussed with Dr. Arnoldo Morale and after decompensated event overnight will place port a cath placement on hold. -Regarding nutrition, will follow feeding supplements recommended by dietitian; continue calorie counting. -If patient is unable to maintain 1800 -calorie/day intake on full liquid diet, may need to consider PEG tube placement to supplement nutrition. -Follow patient in stabilization; at this time PICC line has been ordered.  Symptomatic anemia/heme positive stool -Appreciate GI consult -Dr. Laural Golden followed the patient--> recommended IV Protonix then transition to p.o. Protonix -Transfused 4 units PRBC during this hospitalization; last 2 units ordered on 10/13/2019 in the setting of acute drop in his hemoglobin count; part of these secondary to hemodilution after aggressive fluid resuscitation).. -in retrospect, gastric mass may have been source of bleed -Continue to follow hemoglobin trend and further transfuse as needed for hemoglobin less than 7.  New onset atrial fibrillation -In the setting of anemia and hypertension once again patient has jumped into atrial fibrillation -Previously Cardizem and metoprolol discontinue due to hypotensive event. -1 dose of Lopressor provided without resolution of his A. fib with RVR. -Cardizem drip initiated. -09/30/2019 echo EF 55 to 60%, no WMA -CHA2DS2-VASc = 1 -TSH--0.733 -Will continue holding on anticoagulation/aspirin in light of high risk for bleeding from gastric lesion. --Challenging rate control due to low BP requiring pressors -Add digoxin on 10/14/19  Diabetes mellitus type 2 -09/28/2019 hemoglobin A1c 6.9 -Initially having episodes of hyperglycemia due to steroids -Blood sugars have trended down since steroids have been discontinued and overnight experienced 2 episodes of  hypoglycemia; after eating and transient use of D10 infusion blood sugar within normal limits -Checking cortisol level -Follow CBGs.. -Holding a nightly sliding scale insulin at this time.  Microcytic anemia/Iron deficiency -iron saturation 3% -ferritin low despite COVID-19 -Case discussed with Dr. Delton Coombes who has recommended initiation of Niferex -Once medically stable IV iron infusion also recommended. -Continue transfusion as needed.  Thrombocytosis -due to iron deficiency and acute medical illness -Continue to follow platelets count intermittently.  Class II obesity -BMI 32.14 -Lifestyle modification, portion control, low calorie diet recommended.  Acute kidney injury -Renal ultrasound without hydronephrosis or obstructive uropathy -Foley catheter placed -Urine culture with E. coli as above #1 -Continue IV fluids; minimize the use of nephrotoxic agents and avoid hypotension. -Follow renal function trend.  BPH/urinary retention -Stop tamsulosin due to hemodynamic instability with risk for orthostatic dizziness -Foley catheter placed -Gentle fluid resuscitation -Follow clinical response. -Renal ultrasound without obstructive uropathy or hydronephrosis  Bacteremia -Staph epidermidis 1 out of 2 sets -Represents contaminant -Due to the overnight; repeat blood culture has been ordered.  Generalized weakness -PT evaluation--> skilled nursing facility with which the patient and daughter agree.   CRITICAL CARE Performed by: Roxan Hockey   Total critical care time: 48 minutes  Critical care time was exclusive of separately billable procedures and treating other patients.  Critical care was necessary to treat or prevent imminent or life-threatening deterioration.  -- Clinically patient meets criteria for severe E. coli sepsis with hemodynamic instability as above  Critical care was time spent personally by me on the following activities: development of  treatment plan with patient and/or surrogate as well as nursing, discussions with consultants, evaluation of patient's response to treatment, examination of patient, obtaining history from patient or surrogate, ordering and performing treatments and  interventions, ordering and review of laboratory studies, ordering and review of radiographic studies, pulse oximetry and re-evaluation of patient's condition.   Status is: Inpatient  Remains inpatient appropriate because: IV treatments appropriate due to intensity of illness or inability to take PO   Dispo: The patient is from: Home  Anticipated d/c is to: SNF  Anticipated d/c date is: to be determined  Patient currently is not medically stable to d/c.  After discussion with general surgery plan is for Port-A-Cath placement on 10/14/2019.  Patient will follow up with Dr. Delton Coombes as an outpatient for initiation of chemotherapy.  Due to acute urinary retention and acute kidney injury, will continue IVF's, follow renal function trend and check renal US. Due to a. Fib with RVR and also low BP will continue phylephrine and cardizem drip. Patient afebrile. Continue weaning O2 as tolerated.    Family Communication:   Daughter updated 9/21  Consultants:  GI--Rehman  Code Status:  FULL   DVT Prophylaxis:  SCDs   Procedures: As Listed in Progress Note Above  Antibiotics: None  Follow facility subjective: - Hypotension or hemodynamic instability persist -No fevers, no vomiting or diarrhea  Objective: Vitals:   10/14/19 1400 10/14/19 1500 10/14/19 1600 10/14/19 1700  BP: 123/66     Pulse: (!) 102 91 91 (!) 108  Resp: (!) 22 (!) 33 (!) 22 (!) 26  Temp:    99 F (37.2 C)  TempSrc:    Oral  SpO2: 94% 95% 95% 95%  Weight:      Height:        Intake/Output Summary (Last 24 hours) at 10/14/2019 1802 Last data filed at 10/14/2019 1300 Gross per 24 hour  Intake 3661.94 ml  Output 1575 ml    Net 2086.94 ml   Weight change: 2.4 kg  Exam: General exam: Alert, awake, oriented x 3; denies chest pain, no nausea, no vomiting.  Requiring higher level of oxygen supplementation (up to 5 L high flow nasal cannula).  Blood pressure remains soft and patient is requiring pressor support. Respiratory system: No wheezing, positive scattered rhonchi, positive tachypnea with minimal exertion.  5 L nasal cannula supplementation in place. Cardiovascular system: Irregular irregular, no rubs, no gallops, no JVD on exam. Gastrointestinal system: Abdomen is nondistended, soft and without guarding. . Normal bowel sounds heard. Central nervous system: Alert and oriented. No focal neurological deficits. Extremities: No cyanosis or clubbing. Skin: No rashes, no petechiae. Psychiatry: Judgement and insight appear normal.  Flat affect and depressed mood appreciated.  No suicidal ideation or hallucination. GU-Foley catheter in situ   Data Reviewed: I have personally reviewed following labs and imaging studies  Basic Metabolic Panel: Recent Labs  Lab 10/11/19 0653 10/12/19 0611 10/13/19 0911 10/13/19 1209  NA 134* 131* 132* 133*  K 4.3 4.7 4.8 4.5  CL 103 97* 101 101  CO2 26 26 22  21*  GLUCOSE 72 138* 203* 189*  BUN 22 20 27* 26*  CREATININE 0.64 1.51* 2.00* 1.77*  CALCIUM 7.4* 7.7* 6.8* 7.0*   Liver Function Tests: Recent Labs  Lab 10/12/19 0611 10/13/19 0911  AST 30 507*  ALT 39 313*  ALKPHOS 69 99  BILITOT 0.8 1.2  PROT 4.6* 4.5*  ALBUMIN 2.1* 2.2*   CBC: Recent Labs  Lab 10/10/19 0733 10/11/19 0653 10/12/19 0611 10/13/19 0911 10/14/19 0848  WBC 13.9* 13.6* 15.3* 52.6* 32.1*  NEUTROABS  --   --   --  47.9*  --   HGB 7.7* 7.6* 7.8* 6.4*  8.1*  HCT 26.9* 26.0* 27.0* 21.6* 25.5*  MCV 80.3 80.7 80.1 80.0 80.4  PLT 266 241 264 142* 97*   CBG: Recent Labs  Lab 10/13/19 1724 10/13/19 2116 10/14/19 0805 10/14/19 1215 10/14/19 1710  GLUCAP 210* 184* 233* 224* 188*    Urine analysis:    Component Value Date/Time   COLORURINE YELLOW 10/12/2019 1544   APPEARANCEUR HAZY (A) 10/12/2019 1544   LABSPEC 1.014 10/12/2019 1544   PHURINE 5.0 10/12/2019 1544   GLUCOSEU NEGATIVE 10/12/2019 1544   HGBUR MODERATE (A) 10/12/2019 1544   BILIRUBINUR NEGATIVE 10/12/2019 1544   KETONESUR NEGATIVE 10/12/2019 1544   PROTEINUR NEGATIVE 10/12/2019 1544   NITRITE NEGATIVE 10/12/2019 1544   LEUKOCYTESUR NEGATIVE 10/12/2019 1544   Sepsis Labs: Recent Results (from the past 240 hour(s))  Urine Culture     Status: Abnormal   Collection Time: 10/12/19  3:44 PM   Specimen: Urine, Random  Result Value Ref Range Status   Specimen Description   Final    URINE, RANDOM Performed at [facility], [address] [place], [place] [address]    Special Requests   Final    NONE Performed at [facility], [address] [place], [place] [address]    Culture >=100,000 COLONIES/mL ESCHERICHIA COLI (A)  Final   Report Status 10/14/2019 FINAL  Final   Organism ID, Bacteria ESCHERICHIA COLI (A)  Final      Susceptibility   Escherichia coli - MIC*    AMPICILLIN >=32 RESISTANT Resistant     CEFAZOLIN <=4 SENSITIVE Sensitive     CEFTRIAXONE <=0.25 SENSITIVE Sensitive     CIPROFLOXACIN <=0.25 SENSITIVE Sensitive     GENTAMICIN <=1 SENSITIVE Sensitive     IMIPENEM <=0.25 SENSITIVE Sensitive     NITROFURANTOIN <=16 SENSITIVE Sensitive     TRIMETH/SULFA <=20 SENSITIVE Sensitive     AMPICILLIN/SULBACTAM >=32 RESISTANT Resistant     PIP/TAZO <=4 SENSITIVE Sensitive     * >=100,000 COLONIES/mL ESCHERICHIA COLI  MRSA PCR Screening     Status: None   Collection Time: 10/13/19  4:08 AM   Specimen: Nasopharyngeal  Result Value Ref Range Status   MRSA by PCR NEGATIVE NEGATIVE Final    Comment:        The GeneXpert MRSA Assay (FDA approved for NASAL specimens only), is one component of a comprehensive MRSA colonization surveillance program. It is not intended to diagnose  MRSA infection nor to guide or monitor treatment for MRSA infections. Performed at [facility], [address] [place], [place] [address]   Culture, blood (Routine X 2) w Reflex to ID Panel     Status: None (Preliminary result)   Collection Time: 10/13/19  6:36 PM   Specimen: BLOOD LEFT HAND  Result Value Ref Range Status   Specimen Description BLOOD LEFT HAND  Final   Special Requests   Final    BOTTLES DRAWN AEROBIC ONLY Blood Culture adequate volume   Culture   Final    NO GROWTH < 12 HOURS Performed at [facility], [address], [place], [place] [address]    Report Status PENDING  Incomplete  Culture, blood (Routine X 2) w Reflex to ID Panel     Status: None (Preliminary result)   Collection Time: 10/13/19  6:36 PM   Specimen: BLOOD LEFT HAND  Result Value Ref Range Status   Specimen Description BLOOD LEFT HAND  Final   Special Requests   Final    BOTTLES DRAWN AEROBIC AND ANAEROBIC Blood Culture  adequate volume   Culture   Final    NO GROWTH < 12 HOURS Performed at [facility], [address] [place], [place] [address]    Report Status PENDING  Incomplete     Scheduled Meds: . sodium chloride   Intravenous Once  . vitamin C  500 mg Oral Daily  . busPIRone  5 mg Oral TID  . Chlorhexidine Gluconate Cloth  6 each Topical Daily  . hyoscyamine  0.25 mg Sublingual TID AC  . insulin aspart  0-20 Units Subcutaneous TID WC  . iron polysaccharides  150 mg Oral Daily  . pantoprazole  40 mg Oral BID AC  . sodium chloride flush  10-40 mL Intracatheter Q12H  . tamsulosin  0.4 mg Oral QPC supper  . zinc sulfate  220 mg Oral Daily   Continuous Infusions: . sodium chloride    . cefTRIAXone (ROCEPHIN)  IV    . diltiazem (CARDIZEM) infusion 15 mg/hr (10/14/19 1414)  . lactated ringers 100 mL/hr at 10/14/19 1725  . metronidazole 500 mg (10/14/19 1723)  . phenylephrine (NEO-SYNEPHRINE) Adult infusion Stopped (10/14/19 1731)    Procedures/Studies: DG Chest 2 View  Result  Date: 09/15/2019 CLINICAL DATA:  Acute chest pain. EXAM: CHEST - 2 VIEW COMPARISON:  None. FINDINGS: The heart size and mediastinal contours are within normal limits. No pneumothorax or pleural effusion is noted. Right lung is clear. Minimal left basilar subsegmental atelectasis or scarring is noted. The visualized skeletal structures are unremarkable. IMPRESSION: Minimal left basilar subsegmental atelectasis or scarring. Electronically Signed   By: Marijo Conception M.D.   On: 09/15/2019 16:58   DG Ribs Unilateral Right  Result Date: 09/15/2019 CLINICAL DATA:  Acute right chest pain. EXAM: RIGHT RIBS - 2 VIEW COMPARISON:  None. FINDINGS: No fracture or other bone lesions are seen involving the ribs. IMPRESSION: Negative. Electronically Signed   By: Marijo Conception M.D.   On: 09/15/2019 16:57   CT ANGIO CHEST PE W OR WO CONTRAST  Result Date: 10/03/2019 CLINICAL DATA:  COVID-19 positive, weakness, dizziness and shortness of breath EXAM: CT ANGIOGRAPHY CHEST WITH CONTRAST TECHNIQUE: Multidetector CT imaging of the chest was performed using the standard protocol during bolus administration of intravenous contrast. Multiplanar CT image reconstructions and MIPs were obtained to evaluate the vascular anatomy. CONTRAST:  156mL OMNIPAQUE IOHEXOL 350 MG/ML SOLN COMPARISON:  Radiograph 09/28/2019, CT abdomen pelvis 08/15/2018 FINDINGS: Cardiovascular: Satisfactory opacification the pulmonary arteries to the segmental level. No pulmonary artery filling defects are identified. Central pulmonary arteries are normal caliber. Suboptimal opacification of the thoracic aorta. No discernible acute abnormality of the thoracic aorta proximal great vessels. Aberrant right subclavian artery. Borderline cardiac enlargement. Slight reflux of contrast into the IVC. Mediastinum/Nodes: No mediastinal fluid or gas. Normal thyroid gland and thoracic inlet. No acute abnormality of the trachea or esophagus. No worrisome mediastinal, hilar  or axillary adenopathy. Lungs/Pleura: Mixed areas of heterogeneous consolidative and ground-glass opacity throughout both lungs with a basilar and peripheral predominance. No pneumothorax or visible effusion. Diffuse airways thickening. Upper Abdomen: Indeterminate intermediate attenuation (38 HU) lesion measuring up to 8.5 cm in maximum transaxial dimension within the right lobe liver (4/80). Cholelithiasis with a partially calcified gallstone towards the gallbladder neck. Multiple soft tissue attenuation lesions present along the lesser curvature/gastrohepatic ligament. Largest measuring up to 4.6 cm in size (4/91). Partially exophytic 0.7 cm lesion arising from the upper pole left kidney, corresponding with a previously seen fluid attenuation cyst on prior comparison CT, could reflect  intracystic hemorrhage/proteinaceous debris. Musculoskeletal: Multilevel degenerative changes are present in the imaged portions of the spine. No acute osseous abnormality or suspicious osseous lesion. Probable intramuscular lipoma of the right infraspinatus measuring up to 6.9 by 3.5 cm (4/20). No other acute or worrisome chest wall lesions. Review of the MIP images confirms the above findings. IMPRESSION: 1. No evidence of acute pulmonary artery filling defects. 2. Mixed areas of heterogeneous consolidative and ground-glass opacity throughout both lungs with a basilar and peripheral predominance. Findings are compatible with multifocal pneumonia compatible with a COVID-19 etiology. 3. Indeterminate 4.8 cm lesion in the right lobe liver, incompletely characterized. Additional multiple soft tissue attenuation lesions along the lesser curvature/gastrohepatic ligament, largest measuring up to 4.6 cm in size. These findings are new from comparison abdominal CT 08/15/2018 and could raise concern for potential malignancy. Could consider further evaluation with abdominal MR with contrast. 4. Additional 7 mm hyperdense lesion arising from  the upper pole left kidney, could reflect proteinaceous cysts, Ob also be better evaluated on dedicated abdominal imaging. 5. Cholelithiasis. 6. Aberrant right subclavian artery. These results will be called to the ordering clinician or representative by the Radiologist Assistant, and communication documented in the PACS or [employer]. Electronically Signed   By: Lovena Le M.D.   On: 10/03/2019 15:38   CT ABDOMEN PELVIS W CONTRAST  Result Date: 10/11/2019 CLINICAL DATA:  COVID positive. EXAM: CT ABDOMEN AND PELVIS WITH CONTRAST TECHNIQUE: Multidetector CT imaging of the abdomen and pelvis was performed using the standard protocol following bolus administration of intravenous contrast. CONTRAST:  69mL OMNIPAQUE IOHEXOL 300 MG/ML  SOLN COMPARISON:  August 15, 2018 FINDINGS: Lower chest: Marked severity bilateral lower lobe infiltrates are seen. Hepatobiliary: Multiple large heterogeneous low-attenuation liver masses are seen. The largest measures approximately 6.0 cm x 5.3 cm x 6.1 cm. A 1.7 cm gallstone is seen within the neck of an otherwise normal-appearing gallbladder. Pancreas: Unremarkable. No pancreatic ductal dilatation or surrounding inflammatory changes. Spleen: Normal in size without focal abnormality. Adrenals/Urinary Tract: Adrenal glands are unremarkable. Kidneys are normal in size, without renal calculi or hydronephrosis. A stable 1.2 cm diameter area of heterogeneous low attenuation is seen within the medial aspect of the mid to lower right kidney. A stable 1.2 cm cyst is seen along the posterior aspect of the upper pole of the left kidney. Bladder is unremarkable. Stomach/Bowel: A 3.0 cm x 8.7 cm x 6.3 cm heterogeneous soft tissue mass is seen extending from the wall of the lesser sac of the stomach into the gastric lumen (axial CT images 15 through 25, CT series number 2). Appendix appears normal. There is no evidence of bowel dilatation. Numerous diverticula are seen throughout the  descending and sigmoid colon. Moderate severity thickening of the proximal sigmoid colon is noted (best seen on coronal reformatted images 43 through 50, CT series number 5). Vascular/Lymphatic: No significant vascular findings are present. Reproductive: The prostate gland is markedly enlarged. Other: Multiple round heterogeneous soft tissue masses of various sizes are seen within the upper abdomen, adjacent to the lesser sac of the stomach. The largest measures approximately 5.2 cm x 5.6 cm x 4.6 cm. Musculoskeletal: Multilevel degenerative changes are seen throughout the lumbar spine. IMPRESSION: 1. Marked severity bilateral lower lobe infiltrates. 2. Multiple large heterogeneous low-attenuation liver masses, consistent with metastatic disease. 3. Large gastric mass, suspicious for primary neoplasm. 4. Multiple soft tissue masses within the mesentery of the upper abdomen, adjacent to the lesser sac of the stomach, consistent with metastatic  disease. 5. Colonic diverticulosis. 6. Thickening of the proximal sigmoid colon which may represent sequelae associated with mild colitis/diverticulitis correlation with follow-up abdomen pelvis CT is recommended to exclude the presence of an underlying neoplasm. 7. Stable area of low attenuation within the right kidney. While this may represent a hemorrhagic cyst, correlation with renal ultrasound is recommended. 8. Cholelithiasis. Electronically Signed   By: Virgina Norfolk M.D.   On: 10/11/2019 23:24   MR LIVER W WO CONTRAST  Result Date: 10/04/2019 CLINICAL DATA:  Evaluate liver lesions and upper abdominal adenopathy seen on recent chest CT. EXAM: MRI ABDOMEN WITHOUT AND WITH CONTRAST TECHNIQUE: Multiplanar multisequence MR imaging of the abdomen was performed both before and after the administration of intravenous contrast. CONTRAST:  101mL GADAVIST GADOBUTROL 1 MMOL/ML IV SOLN COMPARISON:  CT abdomen/pelvis 08/15/2018 FINDINGS: Lower chest: The lungs demonstrate  changes of COVID pneumonia as demonstrated on today's chest CT. Hepatobiliary: Numerous diffusion positive hepatic metastatic lesions are noted. Segment 4A lesion measures 3.7 cm on image 7/8. 5 cm lesion and segment 7 on image 12/8. 5 cm lesion in segment 6 on image 18/8. Several other smaller lesions are noted. No intrahepatic biliary dilatation. There is diffuse fatty infiltration of the liver noted. A 2 cm gallstone is noted in the gallbladder. No common bile duct dilatation. Pancreas:  No mass, inflammation or ductal dilatation. Spleen:  Normal size. No focal lesions. Adrenals/Urinary Tract: The adrenal glands and kidneys are unremarkable. Stomach/Bowel: There is a large infiltrating gastric mass involving the fundal region 6.5 x 4.8 cm and is diffusion positive. Moderate contrast enhancement is noted. Adjacent gastrohepatic ligament lymphadenopathy with the largest node measuring 4.6 cm. Vascular/Lymphatic: The aorta is normal in caliber. No dissection. The branch vessels are patent. No retroperitoneal lymphadenopathy. Other:  No ascites or abdominal wall hernia. Musculoskeletal: No worrisome bone lesions. IMPRESSION: 1. 6.5 x 4.8 cm infiltrating gastric mass involving the fundal region. Associated gastrohepatic ligament lymphadenopathy. 2. Numerous hepatic metastatic lesions. 3. Cholelithiasis. Electronically Signed   By: Marijo Sanes M.D.   On: 10/04/2019 06:26   US RENAL  Result Date: 10/13/2019 CLINICAL DATA:  Acute kidney injury, recent discovery of abdominal and liver masses. EXAM: RENAL / URINARY TRACT ULTRASOUND COMPLETE COMPARISON:  MRI 10/04/2019 and CT 10/11/2019 FINDINGS: Right Kidney: Renal measurements: 12.9 x 5.6 x 5.7 cm = volume: 213 mL. Echogenicity within normal limits. No mass or hydronephrosis visualized. Left Kidney: Renal measurements: 14 x 6.3 x 5.6 cm = volume: 258 mL. Echogenicity within normal limits. No mass or hydronephrosis visualized. Bladder: Foley catheter in the urinary  bladder. Other: Incidental liver masses in the RIGHT hepatic lobe measuring 4.3 x 3.4 x 3.6 cm and 6.3 x 4.4 x 5.6 cm, grossly similar accounting for differences in angle of measurement and technique as compared to recent imaging studies. One of these measurements may combine the dimensions of 2 adjacent masses, would refer to previous CT and MRI for follow-up measurements. IMPRESSION: 1. No hydronephrosis. 2. Hepatic metastatic disease better visualized on recent CTs. Electronically Signed   By: Zetta Bills M.D.   On: 10/13/2019 14:20   DG CHEST PORT 1 VIEW  Result Date: 10/13/2019 CLINICAL DATA:  History of COVID-19 positivity, status post left PICC line placement EXAM: PORTABLE CHEST 1 VIEW COMPARISON:  09/28/2019 FINDINGS: Cardiac shadow is stable. Patchy airspace opacities are noted in both lungs increased from the prior exam particularly in the left retrocardiac region consistent with progressive COVID-19 pneumonia. Left-sided PICC line is seen with  the catheter tip at the cavoatrial junction. No bony abnormality is noted. IMPRESSION: PICC line in satisfactory position. Increase in the degree of bilateral patchy airspace opacity consistent with COVID-19 positivity. Electronically Signed   By: Inez Catalina M.D.   On: 10/13/2019 20:06   DG Chest Port 1 View  Result Date: 09/28/2019 CLINICAL DATA:  COVID EXAM: PORTABLE CHEST 1 VIEW COMPARISON:  09/24/2019 FINDINGS: Development of patchy bilateral airspace opacities. No pleural effusion. Stable cardiomediastinal silhouette. No pneumothorax. IMPRESSION: Development of patchy bilateral airspace opacities, consistent with bilateral pneumonia. Electronically Signed   By: Donavan Foil M.D.   On: 09/28/2019 23:14   DG Chest Port 1 View  Result Date: 09/24/2019 CLINICAL DATA:  COVID pneumonia, weakness, fatigue EXAM: PORTABLE CHEST 1 VIEW COMPARISON:  09/15/2019 FINDINGS: The lungs are symmetrically expanded. Minimal left basilar atelectasis is again noted.  No superimposed confluent pulmonary infiltrate. No pneumothorax or pleural effusion. Cardiac size within normal limits. The pulmonary vascularity is normal. No acute bone abnormality. IMPRESSION: Minimal left basilar atelectasis, unchanged. No superimposed confluent pulmonary infiltrate. Electronically Signed   By: Fidela Salisbury MD   On: 09/24/2019 20:15   ECHOCARDIOGRAM COMPLETE  Result Date: 09/30/2019    ECHOCARDIOGRAM REPORT   Patient Name:   KACIN DANCY Date of Exam: 09/30/2019 Medical Rec #:  [id number]         Height:       72.0 in Accession #:    [id number]        Weight:       237.0 lb Date of Birth:  January 26, 1955        BSA:          2.290 m Patient Age:    33 years          BP:           95/60 mmHg Patient Gender: M                 HR:           126 bpm. Exam Location:  [facility] Procedure: 2D Echo Indications:    Endocarditis I38  History:        Patient has no prior history of Echocardiogram examinations.                 Risk Factors:Non-Smoker. Pneumonia due to COVID-19 virus,                 Sepsis, Lactic Acidosis.  Sonographer:    Leavy Cella RDCS (AE) Referring Phys: [id number] OLADAPO ADEFESO IMPRESSIONS  1. Left ventricular ejection fraction, by estimation, is 55 to 60%. The left ventricle has normal function. The left ventricle has no regional wall motion abnormalities. There is moderate left ventricular hypertrophy. Left ventricular diastolic parameters are indeterminate.  2. Right ventricular systolic function is normal. The right ventricular size is normal. Tricuspid regurgitation signal is inadequate for assessing PA pressure.  3. The mitral valve is grossly normal. No evidence of mitral valve regurgitation.  4. The aortic valve is tricuspid. Aortic valve regurgitation is not visualized.  5. The inferior vena cava is normal in size with <50% respiratory variability, suggesting right atrial pressure of 8 mmHg.  6. Views are somewhat limited, but no definite valvular vegetations are  visualized. FINDINGS  Left Ventricle: Left ventricular ejection fraction, by estimation, is 55 to 60%. The left ventricle has normal function. The left ventricle has no regional wall motion abnormalities. The left ventricular internal cavity size was  normal in size. There is  moderate left ventricular hypertrophy. Left ventricular diastolic parameters are indeterminate. Right Ventricle: The right ventricular size is normal. No increase in right ventricular wall thickness. Right ventricular systolic function is normal. Tricuspid regurgitation signal is inadequate for assessing PA pressure. Left Atrium: Left atrial size was normal in size. Right Atrium: Right atrial size was normal in size. Pericardium: There is no evidence of pericardial effusion. Mitral Valve: The mitral valve is grossly normal. No evidence of mitral valve regurgitation. Tricuspid Valve: The tricuspid valve is grossly normal. Tricuspid valve regurgitation is trivial. Aortic Valve: The aortic valve is tricuspid. Aortic valve regurgitation is not visualized. Pulmonic Valve: The pulmonic valve was grossly normal. Pulmonic valve regurgitation is trivial. Aorta: The aortic root is normal in size and structure. Venous: The inferior vena cava is normal in size with less than 50% respiratory variability, suggesting right atrial pressure of 8 mmHg. IAS/Shunts: No atrial level shunt detected by color flow Doppler.  LEFT VENTRICLE PLAX 2D LVIDd:         4.37 cm Diastology LVIDs:         2.20 cm LV e' medial:    7.18 cm/s LV PW:         1.72 cm LV E/e' medial:  9.2 LV IVS:        1.46 cm LV e' lateral:   12.90 cm/s                        LV E/e' lateral: 5.1  RIGHT VENTRICLE RV S prime:     16.00 cm/s TAPSE (M-mode): 1.5 cm LEFT ATRIUM             Index       RIGHT ATRIUM           Index LA diam:        3.70 cm 1.62 cm/m  RA Area:     15.40 cm LA Vol (A2C):   63.9 ml 27.90 ml/m RA Volume:   39.80 ml  17.38 ml/m LA Vol (A4C):   40.6 ml 17.73 ml/m LA Biplane  Vol: 52.4 ml 22.88 ml/m   AORTA Ao Root diam: 3.50 cm MITRAL VALVE MV Area (PHT): 6.60 cm MV Decel Time: 115 msec MV E velocity: 66.00 cm/s MV A velocity: 39.80 cm/s MV E/A ratio:  1.66 Rozann Lesches MD Electronically signed by Rozann Lesches MD Signature Date/Time: 09/30/2019/4:58:16 PM    Final    [place] EKG SITE RITE  Result Date: 10/13/2019 If Site Rite image not attached, placement could not be confirmed due to current cardiac rhythm.   Roxan Hockey, MD  [facility]  If 7PM-7AM, please contact night-coverage [email or web address]  10/14/2019, 6:02 PM   LOS: 16 days

## 2019-10-14 NOTE — Progress Notes (Addendum)
UNMATCHED BLOOD PRODUCT NOTE  Compare the patient ID on the blood tag to the patient ID on the hospital armband and Blood Bank armband. Then confirm the unit number on the blood tag matches the unit number on the blood product.  If a discrepancy is discovered return the product to blood bank immediately.  Blood Product checked by 2nd RN Heather Pinnix  Blood Product Type: Packed Red Blood Cells  Unit #: (Found on blood product bag, begins with W) [id number]  Product Code #: (Found on blood product bag, begins with E) [id number]   Start Time: 2725  Starting Rate: 120 ml/hr  Rate increase/decreased  (if applicable): 366    ml/hr  Rate changed time (if applicable):0458   Stop Time: 10/14/2019 4403   All Other Documentation should be documented within the Blood Admin Flowsheet per policy.

## 2019-10-14 NOTE — Progress Notes (Signed)
Nutrition Follow-up  DOCUMENTATION CODES:   Obesity unspecified  INTERVENTION:  48 hr calorie count    NUTRITION DIAGNOSIS:    Increased nutrient needs related to cancer and cancer related treatments, acute illness, chronic illness as evidenced by estimated needs.   GOAL:   Patient will meet greater than or equal to 90% of their needs  MONITOR:   PO intake, Labs, Supplement acceptance, Weight trends  REASON FOR ASSESSMENT:   Consult Calorie Count  ASSESSMENT:   Patient is an obese 64 yo male who presented with malaise generalized weakness, dizziness. Acute respiratory failure - COVID-19 pneumonia. He has completed remdesivir. Symptomatic anemia-2 units PRBC. New onset artrial fibrillation. Diabetes- A1C-6.9% on 09/28/19. Gastric and Hepatic masses.   10/12/19  A 48-hr Calorie count initiated due to poor oral intake, question if pt able ot maintain adequate intake via oral route. If patient is unable to maintain 1800 -calorie/day intake on full liquid diet, may need to consider PEG tube placement to supplement nutrition.   9/23 Patient moved to ICU for blood pressure monitoring. Port a cath placement cancelled. Calorie count was not moved with patient - therefore no data to assess.   9/24 Discussed pt with nursing. He has breakfast set up and in the process of eating. Nursing informed of calorie count. Envelop placed on door and nursing/nutrition staff aware. Meal intake the past 5 days 0-40% based on documentation. Suspect malnutrition in the patient. Will follow with results of calorie count Monday. RD not on-site over the weekend.   Diet Order:   Diet Order            DIET SOFT Room service appropriate? Yes; Fluid consistency: Thin  Diet effective now                 EDUCATION NEEDS:   No education needs have been identified at this time  Skin:  Skin Assessment: Reviewed RN Assessment  Last BM:  9/20  Height:   Ht Readings from Last 1 Encounters:  09/28/19  6' (1.829 m)    Weight:   Wt Readings from Last 1 Encounters:  10/14/19 110.2 kg    Ideal Body Weight:   81 kg  BMI:  Body mass index is 32.95 kg/m.  Estimated Nutritional Needs:   Kcal:  2240-2508  Protein:  125-134 gr  Fluid:  >2.2 liters daily   Colman Cater MS,RD,CSG,LDN Pager: Shea Evans

## 2019-10-15 LAB — BASIC METABOLIC PANEL
Anion gap: 9 (ref 5–15)
BUN: 22 mg/dL (ref 8–23)
CO2: 28 mmol/L (ref 22–32)
Calcium: 7.6 mg/dL — ABNORMAL LOW (ref 8.9–10.3)
Chloride: 100 mmol/L (ref 98–111)
Creatinine, Ser: 0.86 mg/dL (ref 0.61–1.24)
GFR calc Af Amer: >60 mL/min (ref >60)
GFR calc non Af Amer: >60 mL/min (ref >60)
Glucose, Bld: 164 mg/dL — ABNORMAL HIGH (ref 70–99)
Potassium: 3.6 mmol/L (ref 3.5–5.1)
Sodium: 137 mmol/L (ref 135–145)

## 2019-10-15 LAB — TYPE AND SCREEN
ABO/RH(D): A POS
Antibody Screen: POSITIVE
DAT, IgG: NEGATIVE
Unit division: 0
Unit division: 0

## 2019-10-15 LAB — BPAM RBC
Blood Product Expiration Date: 202110212359
Blood Product Expiration Date: 202110212359
ISSUE DATE / TIME: 202109240120
ISSUE DATE / TIME: 202109240120
Unit Type and Rh: 6200
Unit Type and Rh: 6200

## 2019-10-15 LAB — CBC
HCT: 26.5 % — ABNORMAL LOW (ref 39.0–52.0)
Hemoglobin: 8.3 g/dL — ABNORMAL LOW (ref 13.0–17.0)
MCH: 25.5 pg — ABNORMAL LOW (ref 26.0–34.0)
MCHC: 31.3 g/dL (ref 30.0–36.0)
MCV: 81.5 fL (ref 80.0–100.0)
Platelets: 86 10*3/uL — ABNORMAL LOW (ref 150–400)
RBC: 3.25 MIL/uL — ABNORMAL LOW (ref 4.22–5.81)
RDW: 20.2 % — ABNORMAL HIGH (ref 11.5–15.5)
WBC: 17 10*3/uL — ABNORMAL HIGH (ref 4.0–10.5)
nRBC: 0 % (ref 0.0–0.2)

## 2019-10-15 LAB — GLUCOSE, CAPILLARY
Glucose-Capillary: 168 mg/dL — ABNORMAL HIGH (ref 70–99)
Glucose-Capillary: 183 mg/dL — ABNORMAL HIGH (ref 70–99)
Glucose-Capillary: 178 mg/dL — ABNORMAL HIGH (ref 70–99)
Glucose-Capillary: 179 mg/dL — ABNORMAL HIGH (ref 70–99)

## 2019-10-15 LAB — MAGNESIUM: Magnesium: 1.8 mg/dL (ref 1.7–2.4)

## 2019-10-15 MED ORDER — DIGOXIN 125 MCG PO TABS
0.1250 mg | ORAL_TABLET | Freq: Once | ORAL | Status: AC
  Administered 2019-10-15: 0.125 mg via ORAL

## 2019-10-15 MED ORDER — TAMSULOSIN HCL 0.4 MG PO CAPS
0.4000 mg | ORAL_CAPSULE | Freq: Every day | ORAL | Status: DC
  Administered 2019-10-15 – 2019-10-24 (×10): 0.4 mg via ORAL
  Filled 2019-10-15 (×9): qty 1

## 2019-10-15 NOTE — Progress Notes (Signed)
PROGRESS NOTE  Stephen Wilcox PYP:[id number] DOB: 12-27-1955 DOA: 09/28/2019 PCP: Jake Samples, PA-C  Brief History:  64 year old male without any documented significant chronic medical problems presenting to the ED on 09/24/2019 with lightheadedness malaise, and some generalized weakness.  Patient was diagnosed with positive Covid.  He had declined monoclonal antibody infusion.  On 09/28/2019, the patient presented back to the emergency department with worsening generalized weakness, dizziness, and cough and malaise.  He was noted to have oxygen saturation of 82% room air and placed on 2 L nasal cannula.  Was subsequently started on remdesivir and IV steroids.  At the time of admission, the patient was noted to have a hemoglobin of 7.3 which was a significant drop when compared to 09/24/2019 when his hemoglobin was 9.1.  Hemoccult was noted to be positive during admission.  GI was consulted to assist with management.  Dr. Laural Golden saw the patient.  He did not feel that the patient had any overt active GI bleeding.  He recommended PPI infusion for 72 hours and subsequently transitioned to oral Protonix.  He recommended endoscopic evaluation if the patient's hemoglobin continued to drop again requiring transfusion.  The patient was transfused 2 units PRBC.  His hemoglobin went up to 9.3.  The patient's oxygen demand gradually increased to 6 L, but subsequently remained stable.  Blood cultures grew Staph epidermidis in 1 of 2 sets.  This was thought to be a contaminant.  Empiric antibiotics were discontinued.  On 09/29/2019, the patient was noted to have atrial fibrillation.  EKG confirmed atrial fibrillation.  The patient was started on oral diltiazem.  Echocardiogram on 09/30/2019 showed EF 55 to 60%, no WMA.  Assessment/Plan: Septic shock secondary to E. coli infection with acute Hypotension/elevated lactic acid/Leukocytosis -Afebrile -elevated WBC's, lactic acid and episode of  hypoglycemia. -Hemodynamic instability oxygen resolved, patient weaned off IV phenylephrine / pressure support on 10/14/2019 -Urine culture from 10/12/2019 with E. coli that is resistant to penicillins, okay to de-escalate from Vanco and cefepime to Rocephin -Blood cultures from 10/13/2019 NGTD - negative MRSA PCR -on 10/13/19 Clinically patient meets criteria for severe E. coli sepsis with hemodynamic instability as above -Lactic acid peaked at 3.6 PCT 125.85 WBC  52.6 >> 32.1 >>24.5  Acute metabolic encephalopathy---resolved -In the setting of acute urinary retention -Foley catheter was placed -UA and urine culture sent for evaluation -Patient mentation back to baseline and currently oriented x3.  Acute respiratory failure with hypoxia secondary to COVID-19 pneumonia -Patient was weaned off of oxygen down to 2L, until 9/22 night, when he experienced hypoxia in the 80's range. -currently 5-6L HFNC Oxygen saturation 90% -He has completed his course of steroids and remdesivir -Continue vitamin C and zinc -CRP 19.4>> 15.8>> 7.9>> 3.6>> 1.7>> 1.0>>0.8 -Ferritin 59>> 70>> 95>> 90>> 67>> 53>>50 -D-dimer 1.89>> 1.48>> 1.26>> 1.13>> 1.31>> 1.48>>1.71 -10/03/19 CTA chest--no PE; 4.8 cm lesion in the right lobe liver, incompletely characterized. 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 Delton Coombes who will follow him up as an  outpatient -Requested that CT abdomen and pelvis with contrast be performed  for to complete staging process -Also requested possible port placement by general surgery for chemotherapy -Regarding nutrition, will follow feeding supplements recommended by dietitian; continue calorie counting. -If patient is unable to maintain 1800 -calorie/day intake on full liquid diet, may need to consider PEG tube placement to supplement nutrition. -Port-A-Cath placement on hold due to hemodynamic instability concerns -Patient currently has left arm PICC line  Symptomatic anemia/heme positive stool -Appreciate GI consult -Dr. Laural Golden followed the patient--> recommended IV Protonix then transition to p.o. Protonix -Transfused 4 units PRBC during this hospitalization; last 2 units ordered on 10/13/2019 in the setting of acute drop in his hemoglobin count; part of these secondary to hemodilution after aggressive fluid resuscitation).. -in retrospect, gastric mass may have been source of bleed -Continue to follow hemoglobin trend and further transfuse as needed for hemoglobin less than 7.  New onset atrial fibrillation---With persistent RVR -09/30/2019 echo EF 55 to 60%, no WMA -CHA2DS2-VASc = 1 -TSH--0.733 -Will continue holding on anticoagulation/aspirin in light of high risk for bleeding from gastric lesion. --Continue IV Cardizem drip currently at 15, unable to wean down -BP to soft to add metoprolol so added digoxin on 10/14/2019   Diabetes mellitus type 2 -09/28/2019 hemoglobin A1c 6.9 -Initially having episodes of hyperglycemia due to steroids -Blood sugars have trended down since steroids have been discontinued and overnight experienced 2 episodes of hypoglycemia; after eating and transient use of D10 infusion blood sugar within normal limits -Checking cortisol level -Follow CBGs.. -Holding a nightly sliding scale insulin at this time.  Microcytic anemia/Iron deficiency -iron saturation 3% -ferritin low despite COVID-19 -Case discussed with Dr. Delton Coombes who has  recommended initiation of Niferex -Once medically stable IV iron infusion also recommended. -Continue transfusion as needed.  Thrombocytosis -due to iron deficiency and acute medical illness -Continue to follow platelets count intermittently.  Class II obesity -BMI 32.14 -Lifestyle modification, portion control, low calorie diet recommended.  Acute kidney injury -Renal ultrasound without hydronephrosis or obstructive uropathy -Foley catheter placed -Urine culture with E. coli as above #1 -Renal function improved/normalized with IV fluids and treatment of E. coli UTI --Renally adjust medications, avoid nephrotoxic agents / dehydration  / hypotension -Creatinine has normalized (peaked at 2.0), baseline usually around 0.7   BPH/urinary retention --Foley catheter placed -Restarted Flomax -Consider voiding trial on 10/16/2019 -Renal ultrasound without obstructive uropathy or hydronephrosis  Bacteremia -Blood cultures from 09/28/2019 Staph epidermidis 1 out of 2 sets -Represents contaminant -Repeat blood cultures from 10/13/2019 NGTD  Generalized weakness -PT evaluation--> skilled nursing facility with which the patient and daughter agree.   Status is: Inpatient  Remains inpatient appropriate because: IV treatments appropriate due to intensity of illness or inability to take PO   Disposition: The patient is from: Home  Anticipated d/c is to: SNF  Anticipated d/c date is: to be determined  Patient currently is not medically stable to d/c.  After discussion with general surgery plan is for Port-A-Cath placement on 10/14/2019.  Patient will follow up with Dr. Delton Coombes as an outpatient for initiation of chemotherapy.  Due to acute urinary retention and acute kidney injury, will continue IVF's, follow renal function trend and check renal US. Due to a. Fib with RVR and also low BP will continue phylephrine and cardizem drip. Patient afebrile.  Continue weaning O2 as tolerated.   Family Communication:   Daughter updated 9/21  Consultants:  GI--Rehman  Code Status:  FULL   DVT  Prophylaxis:  SCDs   Procedures: As Listed in Progress Note Above  Antibiotics: None  Follow facility subjective: - No fevers no vomiting or diarrhea -Tachycardia persist with BP improving -Denies chest pains or dizziness  Objective: Vitals:   10/15/19 1103 10/15/19 1110 10/15/19 1130 10/15/19 1200  BP:  125/63 130/73 126/74  Pulse: (!) 111 (!) 105 98 (!) 114  Resp: 19 (!) 30 17 (!) 28  Temp: 98.3 F (36.8 C)     TempSrc: Oral     SpO2: 95% 95% 94% 95%  Weight:      Height:        Intake/Output Summary (Last 24 hours) at 10/15/2019 1608 Last data filed at 10/15/2019 1200 Gross per 24 hour  Intake 1949.51 ml  Output 1350 ml  Net 599.51 ml   Weight change:   Exam: General exam: Alert, awake, oriented x 3;  Nose-  3.5 L/min Respiratory system: No wheezing, positive scattered rhonchi, positive tachypnea with minimal exertion.  5 L nasal cannula supplementation in place. Cardiovascular system: Irregular irregular, no rubs, no gallops, no JVD on exam. Gastrointestinal system: Abdomen is nondistended, soft and without guarding. . Normal bowel sounds heard. Neuro=: Generalized weakness,No focal neurological deficits. Extremities: No cyanosis or clubbing, left arm PICC line in situ Skin: No rashes, no petechiae. Psychiatry: Judgement and insight appear normal.  Flat affect and depressed mood appreciated.  No suicidal ideation or hallucination. GU-Foley catheter in situ   Data Reviewed: I have personally reviewed following labs and imaging studies  Basic Metabolic Panel: Recent Labs  Lab 10/11/19 0653 10/12/19 0611 10/13/19 0911 10/13/19 1209 10/15/19 0435  NA 134* 131* 132* 133* 137  K 4.3 4.7 4.8 4.5 3.6  CL 103 97* 101 101 100  CO2 26 26 22  21* 28  GLUCOSE 72 138* 203* 189* 164*  BUN 22 20 27* 26* 22   CREATININE 0.64 1.51* 2.00* 1.77* 0.86  CALCIUM 7.4* 7.7* 6.8* 7.0* 7.6*  MG  --   --   --   --  1.8   Liver Function Tests: Recent Labs  Lab 10/12/19 0611 10/13/19 0911  AST 30 507*  ALT 39 313*  ALKPHOS 69 99  BILITOT 0.8 1.2  PROT 4.6* 4.5*  ALBUMIN 2.1* 2.2*   CBC: Recent Labs  Lab 10/11/19 0653 10/12/19 0611 10/13/19 0911 10/14/19 0848 10/15/19 0435  WBC 13.6* 15.3* 52.6* 32.1* 17.0*  NEUTROABS  --   --  47.9*  --   --   HGB 7.6* 7.8* 6.4* 8.1* 8.3*  HCT 26.0* 27.0* 21.6* 25.5* 26.5*  MCV 80.7 80.1 80.0 80.4 81.5  PLT 241 264 142* 97* 86*   CBG: Recent Labs  Lab 10/14/19 1215 10/14/19 1710 10/14/19 2329 10/15/19 0802 10/15/19 1058  GLUCAP 224* 188* 149* 168* 183*   Urine analysis:    Component Value Date/Time   COLORURINE YELLOW 10/12/2019 1544   APPEARANCEUR HAZY (A) 10/12/2019 1544   LABSPEC 1.014 10/12/2019 1544   PHURINE 5.0 10/12/2019 1544   GLUCOSEU NEGATIVE 10/12/2019 1544   HGBUR MODERATE (A) 10/12/2019 1544   BILIRUBINUR NEGATIVE 10/12/2019 1544   KETONESUR NEGATIVE 10/12/2019 1544   PROTEINUR NEGATIVE 10/12/2019 1544   NITRITE NEGATIVE 10/12/2019 1544   LEUKOCYTESUR NEGATIVE 10/12/2019 1544   Sepsis Labs: Recent Results (from the past 240 hour(s))  Urine Culture     Status: Abnormal   Collection Time: 10/12/19  3:44 PM   Specimen: Urine, Random  Result Value Ref Range Status   Specimen Description  Final    URINE, RANDOM Performed at [facility], [address], [place], [place] [address]    Special Requests   Final    NONE Performed at [facility], [address] [place], [place] [address]    Culture >=100,000 COLONIES/mL ESCHERICHIA COLI (A)  Final   Report Status 10/14/2019 FINAL  Final   Organism ID, Bacteria ESCHERICHIA COLI (A)  Final      Susceptibility   Escherichia coli - MIC*    AMPICILLIN >=32 RESISTANT Resistant     CEFAZOLIN <=4 SENSITIVE Sensitive     CEFTRIAXONE <=0.25 SENSITIVE Sensitive     CIPROFLOXACIN  <=0.25 SENSITIVE Sensitive     GENTAMICIN <=1 SENSITIVE Sensitive     IMIPENEM <=0.25 SENSITIVE Sensitive     NITROFURANTOIN <=16 SENSITIVE Sensitive     TRIMETH/SULFA <=20 SENSITIVE Sensitive     AMPICILLIN/SULBACTAM >=32 RESISTANT Resistant     PIP/TAZO <=4 SENSITIVE Sensitive     * >=100,000 COLONIES/mL ESCHERICHIA COLI  MRSA PCR Screening     Status: None   Collection Time: 10/13/19  4:08 AM   Specimen: Nasopharyngeal  Result Value Ref Range Status   MRSA by PCR NEGATIVE NEGATIVE Final    Comment:        The GeneXpert MRSA Assay (FDA approved for NASAL specimens only), is one component of a comprehensive MRSA colonization surveillance program. It is not intended to diagnose MRSA infection nor to guide or monitor treatment for MRSA infections. Performed at [facility], [address], [place], [place] [address]   Culture, blood (Routine X 2) w Reflex to ID Panel     Status: None (Preliminary result)   Collection Time: 10/13/19  6:36 PM   Specimen: BLOOD LEFT HAND  Result Value Ref Range Status   Specimen Description BLOOD LEFT HAND  Final   Special Requests   Final    BOTTLES DRAWN AEROBIC ONLY Blood Culture adequate volume   Culture   Final    NO GROWTH 2 DAYS Performed at [facility], [address], [place], [place] [address]    Report Status PENDING  Incomplete  Culture, blood (Routine X 2) w Reflex to ID Panel     Status: None (Preliminary result)   Collection Time: 10/13/19  6:36 PM   Specimen: BLOOD LEFT HAND  Result Value Ref Range Status   Specimen Description BLOOD LEFT HAND  Final   Special Requests   Final    BOTTLES DRAWN AEROBIC AND ANAEROBIC Blood Culture adequate volume   Culture   Final    NO GROWTH 2 DAYS Performed at [facility], [address] [place],  [address]    Report Status PENDING  Incomplete     Scheduled Meds: . sodium chloride   Intravenous Once  . vitamin C  500 mg Oral Daily  . busPIRone  5 mg Oral TID  . Chlorhexidine  Gluconate Cloth  6 each Topical Daily  . digoxin  0.125 mg Oral Daily  . hyoscyamine  0.25 mg Sublingual TID AC  . insulin aspart  0-20 Units Subcutaneous TID WC  . iron polysaccharides  150 mg Oral Daily  . pantoprazole  40 mg Oral BID AC  . sodium chloride flush  10-40 mL Intracatheter Q12H  . zinc sulfate  220 mg Oral Daily   Continuous Infusions: . sodium chloride    . cefTRIAXone (ROCEPHIN)  IV Stopped (10/15/19 0005)  . diltiazem (CARDIZEM) infusion 15 mg/hr (10/15/19 1456)  . lactated ringers 100  mL/hr at 10/14/19 2335  . metronidazole 500 mg (10/15/19 0819)  . phenylephrine (NEO-SYNEPHRINE) Adult infusion Stopped (10/14/19 1730)    Procedures/Studies: CT ANGIO CHEST PE W OR WO CONTRAST  Result Date: 10/03/2019 CLINICAL DATA:  COVID-19 positive, weakness, dizziness and shortness of breath EXAM: CT ANGIOGRAPHY CHEST WITH CONTRAST TECHNIQUE: Multidetector CT imaging of the chest was performed using the standard protocol during bolus administration of intravenous contrast. Multiplanar CT image reconstructions and MIPs were obtained to evaluate the vascular anatomy. CONTRAST:  123mL OMNIPAQUE IOHEXOL 350 MG/ML SOLN COMPARISON:  Radiograph 09/28/2019, CT abdomen pelvis 08/15/2018 FINDINGS: Cardiovascular: Satisfactory opacification the pulmonary arteries to the segmental level. No pulmonary artery filling defects are identified. Central pulmonary arteries are normal caliber. Suboptimal opacification of the thoracic aorta. No discernible acute abnormality of the thoracic aorta proximal great vessels. Aberrant right subclavian artery. Borderline cardiac enlargement. Slight reflux of contrast into the IVC. Mediastinum/Nodes: No mediastinal fluid or gas. Normal thyroid gland and thoracic inlet. No acute abnormality of the trachea or esophagus. No worrisome mediastinal, hilar or axillary adenopathy. Lungs/Pleura: Mixed areas of heterogeneous consolidative and ground-glass opacity throughout both  lungs with a basilar and peripheral predominance. No pneumothorax or visible effusion. Diffuse airways thickening. Upper Abdomen: Indeterminate intermediate attenuation (38 HU) lesion measuring up to 8.5 cm in maximum transaxial dimension within the right lobe liver (4/80). Cholelithiasis with a partially calcified gallstone towards the gallbladder neck. Multiple soft tissue attenuation lesions present along the lesser curvature/gastrohepatic ligament. Largest measuring up to 4.6 cm in size (4/91). Partially exophytic 0.7 cm lesion arising from the upper pole left kidney, corresponding with a previously seen fluid attenuation cyst on prior comparison CT, could reflect intracystic hemorrhage/proteinaceous debris. Musculoskeletal: Multilevel degenerative changes are present in the imaged portions of the spine. No acute osseous abnormality or suspicious osseous lesion. Probable intramuscular lipoma of the right infraspinatus measuring up to 6.9 by 3.5 cm (4/20). No other acute or worrisome chest wall lesions. Review of the MIP images confirms the above findings. IMPRESSION: 1. No evidence of acute pulmonary artery filling defects. 2. Mixed areas of heterogeneous consolidative and ground-glass opacity throughout both lungs with a basilar and peripheral predominance. Findings are compatible with multifocal pneumonia compatible with a COVID-19 etiology. 3. Indeterminate 4.8 cm lesion in the right lobe liver, incompletely characterized. Additional multiple soft tissue attenuation lesions along the lesser curvature/gastrohepatic ligament, largest measuring up to 4.6 cm in size. These findings are new from comparison abdominal CT 08/15/2018 and could raise concern for potential malignancy. Could consider further evaluation with abdominal MR with contrast. 4. Additional 7 mm hyperdense lesion arising from the upper pole left kidney, could reflect proteinaceous cysts, Ob also be better evaluated on dedicated abdominal imaging.  5. Cholelithiasis. 6. Aberrant right subclavian artery. These results will be called to the ordering clinician or representative by the Radiologist Assistant, and communication documented in the PACS or [employer]. Electronically Signed   By: Lovena Le M.D.   On: 10/03/2019 15:38   CT ABDOMEN PELVIS W CONTRAST  Result Date: 10/11/2019 CLINICAL DATA:  COVID positive. EXAM: CT ABDOMEN AND PELVIS WITH CONTRAST TECHNIQUE: Multidetector CT imaging of the abdomen and pelvis was performed using the standard protocol following bolus administration of intravenous contrast. CONTRAST:  29mL OMNIPAQUE IOHEXOL 300 MG/ML  SOLN COMPARISON:  August 15, 2018 FINDINGS: Lower chest: Marked severity bilateral lower lobe infiltrates are seen. Hepatobiliary: Multiple large heterogeneous low-attenuation liver masses are seen. The largest measures approximately 6.0 cm x 5.3 cm x  6.1 cm. A 1.7 cm gallstone is seen within the neck of an otherwise normal-appearing gallbladder. Pancreas: Unremarkable. No pancreatic ductal dilatation or surrounding inflammatory changes. Spleen: Normal in size without focal abnormality. Adrenals/Urinary Tract: Adrenal glands are unremarkable. Kidneys are normal in size, without renal calculi or hydronephrosis. A stable 1.2 cm diameter area of heterogeneous low attenuation is seen within the medial aspect of the mid to lower right kidney. A stable 1.2 cm cyst is seen along the posterior aspect of the upper pole of the left kidney. Bladder is unremarkable. Stomach/Bowel: A 3.0 cm x 8.7 cm x 6.3 cm heterogeneous soft tissue mass is seen extending from the wall of the lesser sac of the stomach into the gastric lumen (axial CT images 15 through 25, CT series number 2). Appendix appears normal. There is no evidence of bowel dilatation. Numerous diverticula are seen throughout the descending and sigmoid colon. Moderate severity thickening of the proximal sigmoid colon is noted (best seen on coronal  reformatted images 43 through 50, CT series number 5). Vascular/Lymphatic: No significant vascular findings are present. Reproductive: The prostate gland is markedly enlarged. Other: Multiple round heterogeneous soft tissue masses of various sizes are seen within the upper abdomen, adjacent to the lesser sac of the stomach. The largest measures approximately 5.2 cm x 5.6 cm x 4.6 cm. Musculoskeletal: Multilevel degenerative changes are seen throughout the lumbar spine. IMPRESSION: 1. Marked severity bilateral lower lobe infiltrates. 2. Multiple large heterogeneous low-attenuation liver masses, consistent with metastatic disease. 3. Large gastric mass, suspicious for primary neoplasm. 4. Multiple soft tissue masses within the mesentery of the upper abdomen, adjacent to the lesser sac of the stomach, consistent with metastatic disease. 5. Colonic diverticulosis. 6. Thickening of the proximal sigmoid colon which may represent sequelae associated with mild colitis/diverticulitis correlation with follow-up abdomen pelvis CT is recommended to exclude the presence of an underlying neoplasm. 7. Stable area of low attenuation within the right kidney. While this may represent a hemorrhagic cyst, correlation with renal ultrasound is recommended. 8. Cholelithiasis. Electronically Signed   By: Virgina Norfolk M.D.   On: 10/11/2019 23:24   MR LIVER W WO CONTRAST  Result Date: 10/04/2019 CLINICAL DATA:  Evaluate liver lesions and upper abdominal adenopathy seen on recent chest CT. EXAM: MRI ABDOMEN WITHOUT AND WITH CONTRAST TECHNIQUE: Multiplanar multisequence MR imaging of the abdomen was performed both before and after the administration of intravenous contrast. CONTRAST:  37mL GADAVIST GADOBUTROL 1 MMOL/ML IV SOLN COMPARISON:  CT abdomen/pelvis 08/15/2018 FINDINGS: Lower chest: The lungs demonstrate changes of COVID pneumonia as demonstrated on today's chest CT. Hepatobiliary: Numerous diffusion positive hepatic  metastatic lesions are noted. Segment 4A lesion measures 3.7 cm on image 7/8. 5 cm lesion and segment 7 on image 12/8. 5 cm lesion in segment 6 on image 18/8. Several other smaller lesions are noted. No intrahepatic biliary dilatation. There is diffuse fatty infiltration of the liver noted. A 2 cm gallstone is noted in the gallbladder. No common bile duct dilatation. Pancreas:  No mass, inflammation or ductal dilatation. Spleen:  Normal size. No focal lesions. Adrenals/Urinary Tract: The adrenal glands and kidneys are unremarkable. Stomach/Bowel: There is a large infiltrating gastric mass involving the fundal region 6.5 x 4.8 cm and is diffusion positive. Moderate contrast enhancement is noted. Adjacent gastrohepatic ligament lymphadenopathy with the largest node measuring 4.6 cm. Vascular/Lymphatic: The aorta is normal in caliber. No dissection. The branch vessels are patent. No retroperitoneal lymphadenopathy. Other:  No ascites or abdominal wall hernia.  Musculoskeletal: No worrisome bone lesions. IMPRESSION: 1. 6.5 x 4.8 cm infiltrating gastric mass involving the fundal region. Associated gastrohepatic ligament lymphadenopathy. 2. Numerous hepatic metastatic lesions. 3. Cholelithiasis. Electronically Signed   By: Marijo Sanes M.D.   On: 10/04/2019 06:26   US RENAL  Result Date: 10/13/2019 CLINICAL DATA:  Acute kidney injury, recent discovery of abdominal and liver masses. EXAM: RENAL / URINARY TRACT ULTRASOUND COMPLETE COMPARISON:  MRI 10/04/2019 and CT 10/11/2019 FINDINGS: Right Kidney: Renal measurements: 12.9 x 5.6 x 5.7 cm = volume: 213 mL. Echogenicity within normal limits. No mass or hydronephrosis visualized. Left Kidney: Renal measurements: 14 x 6.3 x 5.6 cm = volume: 258 mL. Echogenicity within normal limits. No mass or hydronephrosis visualized. Bladder: Foley catheter in the urinary bladder. Other: Incidental liver masses in the RIGHT hepatic lobe measuring 4.3 x 3.4 x 3.6 cm and 6.3 x 4.4 x 5.6  cm, grossly similar accounting for differences in angle of measurement and technique as compared to recent imaging studies. One of these measurements may combine the dimensions of 2 adjacent masses, would refer to previous CT and MRI for follow-up measurements. IMPRESSION: 1. No hydronephrosis. 2. Hepatic metastatic disease better visualized on recent CTs. Electronically Signed   By: Zetta Bills M.D.   On: 10/13/2019 14:20   DG CHEST PORT 1 VIEW  Result Date: 10/13/2019 CLINICAL DATA:  History of COVID-19 positivity, status post left PICC line placement EXAM: PORTABLE CHEST 1 VIEW COMPARISON:  09/28/2019 FINDINGS: Cardiac shadow is stable. Patchy airspace opacities are noted in both lungs increased from the prior exam particularly in the left retrocardiac region consistent with progressive COVID-19 pneumonia. Left-sided PICC line is seen with the catheter tip at the cavoatrial junction. No bony abnormality is noted. IMPRESSION: PICC line in satisfactory position. Increase in the degree of bilateral patchy airspace opacity consistent with COVID-19 positivity. Electronically Signed   By: Inez Catalina M.D.   On: 10/13/2019 20:06   DG Chest Port 1 View  Result Date: 09/28/2019 CLINICAL DATA:  COVID EXAM: PORTABLE CHEST 1 VIEW COMPARISON:  09/24/2019 FINDINGS: Development of patchy bilateral airspace opacities. No pleural effusion. Stable cardiomediastinal silhouette. No pneumothorax. IMPRESSION: Development of patchy bilateral airspace opacities, consistent with bilateral pneumonia. Electronically Signed   By: Donavan Foil M.D.   On: 09/28/2019 23:14   DG Chest Port 1 View  Result Date: 09/24/2019 CLINICAL DATA:  COVID pneumonia, weakness, fatigue EXAM: PORTABLE CHEST 1 VIEW COMPARISON:  09/15/2019 FINDINGS: The lungs are symmetrically expanded. Minimal left basilar atelectasis is again noted. No superimposed confluent pulmonary infiltrate. No pneumothorax or pleural effusion. Cardiac size within normal  limits. The pulmonary vascularity is normal. No acute bone abnormality. IMPRESSION: Minimal left basilar atelectasis, unchanged. No superimposed confluent pulmonary infiltrate. Electronically Signed   By: Fidela Salisbury MD   On: 09/24/2019 20:15   ECHOCARDIOGRAM COMPLETE  Result Date: 09/30/2019    ECHOCARDIOGRAM REPORT   Patient Name:   GALO SAYED Date of Exam: 09/30/2019 Medical Rec #:  [id number]         Height:       72.0 in Accession #:    [id number]        Weight:       237.0 lb Date of Birth:  15-Jul-1955        BSA:          2.290 m Patient Age:    64 years          BP:  95/60 mmHg Patient Gender: M                 HR:           126 bpm. Exam Location:  [facility] Procedure: 2D Echo Indications:    Endocarditis I38  History:        Patient has no prior history of Echocardiogram examinations.                 Risk Factors:Non-Smoker. Pneumonia due to COVID-19 virus,                 Sepsis, Lactic Acidosis.  Sonographer:    Leavy Cella RDCS (AE) Referring Phys: [id number] OLADAPO ADEFESO IMPRESSIONS  1. Left ventricular ejection fraction, by estimation, is 55 to 60%. The left ventricle has normal function. The left ventricle has no regional wall motion abnormalities. There is moderate left ventricular hypertrophy. Left ventricular diastolic parameters are indeterminate.  2. Right ventricular systolic function is normal. The right ventricular size is normal. Tricuspid regurgitation signal is inadequate for assessing PA pressure.  3. The mitral valve is grossly normal. No evidence of mitral valve regurgitation.  4. The aortic valve is tricuspid. Aortic valve regurgitation is not visualized.  5. The inferior vena cava is normal in size with <50% respiratory variability, suggesting right atrial pressure of 8 mmHg.  6. Views are somewhat limited, but no definite valvular vegetations are visualized. FINDINGS  Left Ventricle: Left ventricular ejection fraction, by estimation, is 55 to 60%. The left  ventricle has normal function. The left ventricle has no regional wall motion abnormalities. The left ventricular internal cavity size was normal in size. There is  moderate left ventricular hypertrophy. Left ventricular diastolic parameters are indeterminate. Right Ventricle: The right ventricular size is normal. No increase in right ventricular wall thickness. Right ventricular systolic function is normal. Tricuspid regurgitation signal is inadequate for assessing PA pressure. Left Atrium: Left atrial size was normal in size. Right Atrium: Right atrial size was normal in size. Pericardium: There is no evidence of pericardial effusion. Mitral Valve: The mitral valve is grossly normal. No evidence of mitral valve regurgitation. Tricuspid Valve: The tricuspid valve is grossly normal. Tricuspid valve regurgitation is trivial. Aortic Valve: The aortic valve is tricuspid. Aortic valve regurgitation is not visualized. Pulmonic Valve: The pulmonic valve was grossly normal. Pulmonic valve regurgitation is trivial. Aorta: The aortic root is normal in size and structure. Venous: The inferior vena cava is normal in size with less than 50% respiratory variability, suggesting right atrial pressure of 8 mmHg. IAS/Shunts: No atrial level shunt detected by color flow Doppler.  LEFT VENTRICLE PLAX 2D LVIDd:         4.37 cm Diastology LVIDs:         2.20 cm LV e' medial:    7.18 cm/s LV PW:         1.72 cm LV E/e' medial:  9.2 LV IVS:        1.46 cm LV e' lateral:   12.90 cm/s                        LV E/e' lateral: 5.1  RIGHT VENTRICLE RV S prime:     16.00 cm/s TAPSE (M-mode): 1.5 cm LEFT ATRIUM             Index       RIGHT ATRIUM           Index LA diam:  3.70 cm 1.62 cm/m  RA Area:     15.40 cm LA Vol (A2C):   63.9 ml 27.90 ml/m RA Volume:   39.80 ml  17.38 ml/m LA Vol (A4C):   40.6 ml 17.73 ml/m LA Biplane Vol: 52.4 ml 22.88 ml/m   AORTA Ao Root diam: 3.50 cm MITRAL VALVE MV Area (PHT): 6.60 cm MV Decel Time: 115  msec MV E velocity: 66.00 cm/s MV A velocity: 39.80 cm/s MV E/A ratio:  1.66 Rozann Lesches MD Electronically signed by Rozann Lesches MD Signature Date/Time: 09/30/2019/4:58:16 PM    Final    [place] EKG SITE RITE  Result Date: 10/13/2019 If Site Rite image not attached, placement could not be confirmed due to current cardiac rhythm.   Roxan Hockey, MD  [facility]  If 7PM-7AM, please contact night-coverage [email or web address]  10/15/2019, 4:08 PM   LOS: 17 days

## 2019-10-16 LAB — GLUCOSE, CAPILLARY
Glucose-Capillary: 171 mg/dL — ABNORMAL HIGH (ref 70–99)
Glucose-Capillary: 172 mg/dL — ABNORMAL HIGH (ref 70–99)
Glucose-Capillary: 210 mg/dL — ABNORMAL HIGH (ref 70–99)
Glucose-Capillary: 223 mg/dL — ABNORMAL HIGH (ref 70–99)

## 2019-10-16 MED ORDER — MIRTAZAPINE 15 MG PO TABS
15.0000 mg | ORAL_TABLET | Freq: Every day | ORAL | Status: DC
  Administered 2019-10-16 – 2019-10-24 (×9): 15 mg via ORAL
  Filled 2019-10-16 (×9): qty 1

## 2019-10-16 NOTE — Progress Notes (Signed)
PROGRESS NOTE  Stephen Wilcox ZYY:[id number] DOB: 07/19/1955 DOA: 09/28/2019 PCP: Jake Samples, PA-C  Brief History:  64 year old male without any documented significant chronic medical problems presenting to the ED on 09/24/2019 with lightheadedness malaise, and some generalized weakness.  Patient was diagnosed with positive Covid.  He had declined monoclonal antibody infusion.  On 09/28/2019, the patient presented back to the emergency department with worsening generalized weakness, dizziness, and cough and malaise.  He was noted to have oxygen saturation of 82% room air and placed on 2 L nasal cannula.  Was subsequently started on remdesivir and IV steroids.  At the time of admission, the patient was noted to have a hemoglobin of 7.3 which was a significant drop when compared to 09/24/2019 when his hemoglobin was 9.1.  Hemoccult was noted to be positive during admission.  GI was consulted to assist with management.  Dr. Laural Golden saw the patient.  He did not feel that the patient had any overt active GI bleeding.  He recommended PPI infusion for 72 hours and subsequently transitioned to oral Protonix.  He recommended endoscopic evaluation if the patient's hemoglobin continued to drop again requiring transfusion.  The patient was transfused 2 units PRBC.  His hemoglobin went up to 9.3.  The patient's oxygen demand gradually increased to 6 L, but subsequently remained stable.  Blood cultures grew Staph epidermidis in 1 of 2 sets.  This was thought to be a contaminant.  Empiric antibiotics were discontinued.  On 09/29/2019, the patient was noted to have atrial fibrillation.  EKG confirmed atrial fibrillation.  The patient was started on oral diltiazem.  Echocardiogram on 09/30/2019 showed EF 55 to 60%, no WMA.  Assessment/Plan: Septic Shock Secondary to E. coli infection with Acute Hypotension/Elevated Lactic Acid/Leukocytosis -Afebrile -elevated WBC's, lactic acid and episode of  hypoglycemia. -Hemodynamic instability oxygen resolved, patient weaned off IV phenylephrine / pressure support on 10/14/2019 -Urine culture from 10/12/2019 with E. coli that is resistant to penicillins, okay to de-escalate from Vanco and cefepime to Rocephin -Flagyl discontinued -Blood cultures from 10/13/2019 NGTD - negative MRSA PCR -on 10/13/19 Clinically patient meets criteria for severe E. coli sepsis with hemodynamic instability as above -Lactic acid peaked at 3.6 PCT 125.85 WBC  52.6 >> 32.1 >>48.8  Acute metabolic encephalopathy---resolved -In the setting of acute urinary retention and E Coli UTI/Sepsis -Patient mentation back to baseline and currently oriented x3.  Acute respiratory failure with hypoxia secondary to COVID-19 pneumonia -currently 3 to 4 L  Oxygen  -He has completed his course of steroids and remdesivir -Continue vitamin C and zinc -CRP 19.4>> 15.8>> 7.9>> 3.6>> 1.7>> 1.0>>0.8 -Ferritin 59>> 70>> 95>> 90>> 67>> 53>>50 -D-dimer 1.89>> 1.48>> 1.26>> 1.13>> 1.31>> 1.48>>1.71 -10/03/19 CTA chest--no PE; 4.8 cm lesion in the right lobe liver, incompletely characterized. 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 Delton Coombes who will follow him up as an outpatient -Requested that CT abdomen and pelvis with contrast be performed for to complete staging process -Also requested possible port placement by general  surgery for chemotherapy -Regarding nutrition, will follow feeding  supplements recommended by dietitian; continue calorie counting. -If patient is unable to maintain 1800 -calorie/day intake on full liquid diet, may need to consider PEG tube placement to supplement nutrition. -Port-A-Cath placement on hold due to hemodynamic instability concerns -Patient currently has left arm PICC line  Symptomatic Anemia/heme positive stool -Appreciate GI consult -Dr. Laural Golden followed the patient--> -Transfused 4 units PRBC during this hospitalization; last 2 units ordered on 10/13/2019 in the setting of acute drop in his hemoglobin count; part of these secondary to hemodilution after aggressive fluid resuscitation).. -in retrospect, gastric mass may have been source of bleed -Continue to follow hemoglobin trend and further transfuse as needed for hemoglobin less than 7. -Continue p.o. Protonix  New onset atrial fibrillation---With persistent RVR -09/30/2019 echo EF 55 to 60%, no WMA -CHA2DS2-VASc = 1 -TSH--0.733 -Will continue holding on anticoagulation/aspirin in light of high risk for bleeding from gastric lesion. --Try to wean IV Cardizem drip   -BP to soft to add metoprolol so added digoxin on 10/14/2019  Diabetes mellitus type 2 -09/28/2019 hemoglobin A1c 6.9 Use Novolog/Humalog Sliding scale insulin with Accu-Cheks/Fingersticks as ordered -Holding a nightly sliding scale insulin at this time.  Microcytic anemia/Iron deficiency -iron saturation 3% -ferritin low despite COVID-19 -Case discussed with Dr. Delton Coombes who has recommended initiation of Niferex -Once medically stable IV iron infusion also recommended. -Continue transfusion as needed.  Class II obesity -BMI 32.14---this increases overall morbidity and mortality -Lifestyle modification, portion control, low calorie diet recommended after resolution of acute illness  Acute kidney injury -Renal ultrasound without hydronephrosis or obstructive uropathy -Foley catheter placed -Urine culture with  E. coli as above #1 -Renal function improved/normalized with IV fluids and treatment of E. coli UTI --Renally adjust medications, avoid nephrotoxic agents / dehydration  / hypotension -Creatinine has normalized (peaked at 2.0), baseline usually around 0.7  BPH/urinary retention -Continue Flomax -Foley removed and voiding trial initiated on 10/16/2019 -Renal ultrasound without obstructive uropathy or hydronephrosis  Bacteremia -Blood cultures from 09/28/2019 Staph epidermidis 1 out of 2 sets -Represents contaminant -Repeat blood cultures from 10/13/2019 NGTD  Generalized Weakness and Deconditioning -PT evaluation--> skilled nursing facility with which the patient and daughter agree.  Anorexia/Adjustment Disorder--- start Remeron 15 mg qhs  Status is: Inpatient  Remains inpatient appropriate because: Unable to wean off IV Cardizem drip, continues to require IV antibiotics for E. coli sepsis   disposition: The patient is from: Home  Anticipated d/c is to: SNF  Anticipated d/c date is: to be determined  Patient currently is not medically stable to d/c.  Unable to wean off IV Cardizem drip, continues to require IV antibiotics for E. coli sepsis   Family Communication:   Discussed with Daughter [name] at [phone]  Consultants:  GI--Rehman  Code Status:  FULL   DVT Prophylaxis:  SCDs (Gi Bleed)  Procedures: As Listed in Progress Note Above  Antibiotics: None  Subjective: - 10/16/19 No fevers no vomiting or diarrhea --Denies chest pains or dizziness -Having difficulty with voiding after Foley removal -Appetite remains poor  Objective: Vitals:   10/16/19 0600 10/16/19 0737 10/16/19 1100 10/16/19 1143  BP: 134/78  123/82   Pulse: 87 92 92 96  Resp: 18 (!) 30 (!) 24 (!) 25  Temp:  98.2 F (36.8 C)  98.2 F (36.8 C)  TempSrc:  Oral  Oral  SpO2: 95% 95% 96% 95%  Weight:      Height:  Intake/Output  Summary (Last 24 hours) at 10/16/2019 1404 Last data filed at 10/15/2019 1800 Gross per 24 hour  Intake 673 ml  Output 375 ml  Net 298 ml   Weight change:   Exam: General exam: Alert, awake, oriented x 3;  Nose- [place] 3.5 L/min Respiratory system: No wheezing, few scattered rhonchi, fair air movement Cardiovascular system: Irregular irregular, tachy gastrointestinal system: Abdomen is nondistended, soft and without guarding. . Normal bowel sounds heard. Neuro=: Generalized weakness,No focal neurological deficits. Extremities: No cyanosis or clubbing, left arm PICC line in situ Skin: No rashes, no petechiae. Psychiatry: Judgement and insight appear normal.  Flat affect and depressed mood appreciated.  No suicidal ideation or hallucination. GU-Foley catheter in situ   Data Reviewed: I have personally reviewed following labs and imaging studies  Basic Metabolic Panel: Recent Labs  Lab 10/11/19 0653 10/12/19 0611 10/13/19 0911 10/13/19 1209 10/15/19 0435  NA 134* 131* 132* 133* 137  K 4.3 4.7 4.8 4.5 3.6  CL 103 97* 101 101 100  CO2 26 26 22  21* 28  GLUCOSE 72 138* 203* 189* 164*  BUN 22 20 27* 26* 22  CREATININE 0.64 1.51* 2.00* 1.77* 0.86  CALCIUM 7.4* 7.7* 6.8* 7.0* 7.6*  MG  --   --   --   --  1.8   Liver Function Tests: Recent Labs  Lab 10/12/19 0611 10/13/19 0911  AST 30 507*  ALT 39 313*  ALKPHOS 69 99  BILITOT 0.8 1.2  PROT 4.6* 4.5*  ALBUMIN 2.1* 2.2*   CBC: Recent Labs  Lab 10/11/19 0653 10/12/19 0611 10/13/19 0911 10/14/19 0848 10/15/19 0435  WBC 13.6* 15.3* 52.6* 32.1* 17.0*  NEUTROABS  --   --  47.9*  --   --   HGB 7.6* 7.8* 6.4* 8.1* 8.3*  HCT 26.0* 27.0* 21.6* 25.5* 26.5*  MCV 80.7 80.1 80.0 80.4 81.5  PLT 241 264 142* 97* 86*   CBG: Recent Labs  Lab 10/15/19 1058 10/15/19 1618 10/15/19 2117 10/16/19 0736 10/16/19 1142  GLUCAP 183* 178* 179* 171* 172*   Urine analysis:    Component Value Date/Time   COLORURINE YELLOW 10/12/2019  1544   APPEARANCEUR HAZY (A) 10/12/2019 1544   LABSPEC 1.014 10/12/2019 1544   PHURINE 5.0 10/12/2019 1544   GLUCOSEU NEGATIVE 10/12/2019 1544   HGBUR MODERATE (A) 10/12/2019 1544   BILIRUBINUR NEGATIVE 10/12/2019 1544   KETONESUR NEGATIVE 10/12/2019 1544   PROTEINUR NEGATIVE 10/12/2019 1544   NITRITE NEGATIVE 10/12/2019 1544   LEUKOCYTESUR NEGATIVE 10/12/2019 1544   Sepsis Labs: Recent Results (from the past 240 hour(s))  Urine Culture     Status: Abnormal   Collection Time: 10/12/19  3:44 PM   Specimen: Urine, Random  Result Value Ref Range Status   Specimen Description   Final    URINE, RANDOM Performed at [facility], [address], [place], [place] [address]    Special Requests   Final    NONE Performed at [facility], [address] [place],  [address]    Culture >=100,000 COLONIES/mL ESCHERICHIA COLI (A)  Final   Report Status 10/14/2019 FINAL  Final   Organism ID, Bacteria ESCHERICHIA COLI (A)  Final      Susceptibility   Escherichia coli - MIC*    AMPICILLIN >=32 RESISTANT Resistant     CEFAZOLIN <=4 SENSITIVE Sensitive     CEFTRIAXONE <=0.25 SENSITIVE Sensitive     CIPROFLOXACIN <=0.25 SENSITIVE Sensitive     GENTAMICIN <=1 SENSITIVE Sensitive  IMIPENEM <=0.25 SENSITIVE Sensitive     NITROFURANTOIN <=16 SENSITIVE Sensitive     TRIMETH/SULFA <=20 SENSITIVE Sensitive     AMPICILLIN/SULBACTAM >=32 RESISTANT Resistant     PIP/TAZO <=4 SENSITIVE Sensitive     * >=100,000 COLONIES/mL ESCHERICHIA COLI  MRSA PCR Screening     Status: None   Collection Time: 10/13/19  4:08 AM   Specimen: Nasopharyngeal  Result Value Ref Range Status   MRSA by PCR NEGATIVE NEGATIVE Final    Comment:        The GeneXpert MRSA Assay (FDA approved for NASAL specimens only), is one component of a comprehensive MRSA colonization surveillance program. It is not intended to diagnose MRSA infection nor to guide or monitor treatment for MRSA infections. Performed at [facility], [address], [place], [place] [address]   Culture, blood (Routine X 2) w Reflex to ID Panel     Status: None (Preliminary result)   Collection Time: 10/13/19  6:36 PM   Specimen: BLOOD LEFT HAND  Result Value Ref Range Status   Specimen Description BLOOD LEFT HAND  Final   Special Requests   Final    BOTTLES DRAWN AEROBIC ONLY Blood Culture adequate volume   Culture   Final    NO GROWTH 2 DAYS Performed at [facility], [address] [place], [place] [address]    Report Status PENDING  Incomplete  Culture, blood (Routine X 2) w Reflex to ID Panel     Status: None (Preliminary result)   Collection Time: 10/13/19  6:36 PM   Specimen: BLOOD LEFT HAND  Result Value Ref Range Status   Specimen Description BLOOD LEFT HAND  Final   Special Requests   Final    BOTTLES DRAWN AEROBIC AND ANAEROBIC Blood Culture adequate volume   Culture   Final    NO GROWTH 2 DAYS Performed at [facility], [address], [place],  [address]    Report Status PENDING  Incomplete     Scheduled Meds: . sodium chloride   Intravenous Once  . vitamin C  500 mg Oral Daily  . busPIRone  5 mg Oral TID  . Chlorhexidine Gluconate Cloth  6 each Topical Daily  . digoxin  0.125 mg Oral Daily  . hyoscyamine  0.25 mg Sublingual TID AC  . insulin aspart  0-20 Units Subcutaneous TID WC  . iron polysaccharides  150 mg Oral Daily  . pantoprazole  40 mg Oral BID AC  . sodium chloride flush  10-40 mL Intracatheter Q12H  . tamsulosin  0.4 mg Oral QPC supper  . zinc sulfate  220 mg Oral Daily   Continuous Infusions: . sodium chloride    . cefTRIAXone (ROCEPHIN)  IV 2 g (10/15/19 2154)  . diltiazem (CARDIZEM) infusion 15 mg/hr (10/16/19 0601)  . lactated ringers 100 mL/hr at 10/16/19 0600  . metronidazole 500 mg (10/16/19 0915)  . phenylephrine (NEO-SYNEPHRINE) Adult infusion Stopped (10/14/19 1730)    Procedures/Studies: CT ANGIO CHEST PE W OR WO CONTRAST  Result Date: 10/03/2019 CLINICAL DATA:   COVID-19 positive, weakness, dizziness and shortness of breath EXAM: CT ANGIOGRAPHY CHEST WITH CONTRAST TECHNIQUE: Multidetector CT imaging of the chest was performed using the standard protocol during bolus administration of intravenous contrast. Multiplanar CT image reconstructions and MIPs were obtained to evaluate the vascular anatomy. CONTRAST:  131mL OMNIPAQUE IOHEXOL 350 MG/ML SOLN COMPARISON:  Radiograph 09/28/2019, CT abdomen pelvis 08/15/2018 FINDINGS: Cardiovascular: Satisfactory opacification the pulmonary arteries to the segmental level. No pulmonary  artery filling defects are identified. Central pulmonary arteries are normal caliber. Suboptimal opacification of the thoracic aorta. No discernible acute abnormality of the thoracic aorta proximal great vessels. Aberrant right subclavian artery. Borderline cardiac enlargement. Slight reflux of contrast into the IVC. Mediastinum/Nodes: No mediastinal fluid or gas. Normal thyroid gland and thoracic inlet. No acute abnormality of the trachea or esophagus. No worrisome mediastinal, hilar or axillary adenopathy. Lungs/Pleura: Mixed areas of heterogeneous consolidative and ground-glass opacity throughout both lungs with a basilar and peripheral predominance. No pneumothorax or visible effusion. Diffuse airways thickening. Upper Abdomen: Indeterminate intermediate attenuation (38 HU) lesion measuring up to 8.5 cm in maximum transaxial dimension within the right lobe liver (4/80). Cholelithiasis with a partially calcified gallstone towards the gallbladder neck. Multiple soft tissue attenuation lesions present along the lesser curvature/gastrohepatic ligament. Largest measuring up to 4.6 cm in size (4/91). Partially exophytic 0.7 cm lesion arising from the upper pole left kidney, corresponding with a previously seen fluid attenuation cyst on prior comparison CT, could reflect intracystic hemorrhage/proteinaceous debris. Musculoskeletal: Multilevel degenerative  changes are present in the imaged portions of the spine. No acute osseous abnormality or suspicious osseous lesion. Probable intramuscular lipoma of the right infraspinatus measuring up to 6.9 by 3.5 cm (4/20). No other acute or worrisome chest wall lesions. Review of the MIP images confirms the above findings. IMPRESSION: 1. No evidence of acute pulmonary artery filling defects. 2. Mixed areas of heterogeneous consolidative and ground-glass opacity throughout both lungs with a basilar and peripheral predominance. Findings are compatible with multifocal pneumonia compatible with a COVID-19 etiology. 3. Indeterminate 4.8 cm lesion in the right lobe liver, incompletely characterized. Additional multiple soft tissue attenuation lesions along the lesser curvature/gastrohepatic ligament, largest measuring up to 4.6 cm in size. These findings are new from comparison abdominal CT 08/15/2018 and could raise concern for potential malignancy. Could consider further evaluation with abdominal MR with contrast. 4. Additional 7 mm hyperdense lesion arising from the upper pole left kidney, could reflect proteinaceous cysts, Ob also be better evaluated on dedicated abdominal imaging. 5. Cholelithiasis. 6. Aberrant right subclavian artery. These results will be called to the ordering clinician or representative by the Radiologist Assistant, and communication documented in the PACS or [employer]. Electronically Signed   By: Lovena Le M.D.   On: 10/03/2019 15:38   CT ABDOMEN PELVIS W CONTRAST  Result Date: 10/11/2019 CLINICAL DATA:  COVID positive. EXAM: CT ABDOMEN AND PELVIS WITH CONTRAST TECHNIQUE: Multidetector CT imaging of the abdomen and pelvis was performed using the standard protocol following bolus administration of intravenous contrast. CONTRAST:  35mL OMNIPAQUE IOHEXOL 300 MG/ML  SOLN COMPARISON:  August 15, 2018 FINDINGS: Lower chest: Marked severity bilateral lower lobe infiltrates are seen. Hepatobiliary:  Multiple large heterogeneous low-attenuation liver masses are seen. The largest measures approximately 6.0 cm x 5.3 cm x 6.1 cm. A 1.7 cm gallstone is seen within the neck of an otherwise normal-appearing gallbladder. Pancreas: Unremarkable. No pancreatic ductal dilatation or surrounding inflammatory changes. Spleen: Normal in size without focal abnormality. Adrenals/Urinary Tract: Adrenal glands are unremarkable. Kidneys are normal in size, without renal calculi or hydronephrosis. A stable 1.2 cm diameter area of heterogeneous low attenuation is seen within the medial aspect of the mid to lower right kidney. A stable 1.2 cm cyst is seen along the posterior aspect of the upper pole of the left kidney. Bladder is unremarkable. Stomach/Bowel: A 3.0 cm x 8.7 cm x 6.3 cm heterogeneous soft tissue mass is seen extending from the wall  of the lesser sac of the stomach into the gastric lumen (axial CT images 15 through 25, CT series number 2). Appendix appears normal. There is no evidence of bowel dilatation. Numerous diverticula are seen throughout the descending and sigmoid colon. Moderate severity thickening of the proximal sigmoid colon is noted (best seen on coronal reformatted images 43 through 50, CT series number 5). Vascular/Lymphatic: No significant vascular findings are present. Reproductive: The prostate gland is markedly enlarged. Other: Multiple round heterogeneous soft tissue masses of various sizes are seen within the upper abdomen, adjacent to the lesser sac of the stomach. The largest measures approximately 5.2 cm x 5.6 cm x 4.6 cm. Musculoskeletal: Multilevel degenerative changes are seen throughout the lumbar spine. IMPRESSION: 1. Marked severity bilateral lower lobe infiltrates. 2. Multiple large heterogeneous low-attenuation liver masses, consistent with metastatic disease. 3. Large gastric mass, suspicious for primary neoplasm. 4. Multiple soft tissue masses within the mesentery of the upper abdomen,  adjacent to the lesser sac of the stomach, consistent with metastatic disease. 5. Colonic diverticulosis. 6. Thickening of the proximal sigmoid colon which may represent sequelae associated with mild colitis/diverticulitis correlation with follow-up abdomen pelvis CT is recommended to exclude the presence of an underlying neoplasm. 7. Stable area of low attenuation within the right kidney. While this may represent a hemorrhagic cyst, correlation with renal ultrasound is recommended. 8. Cholelithiasis. Electronically Signed   By: Virgina Norfolk M.D.   On: 10/11/2019 23:24   MR LIVER W WO CONTRAST  Result Date: 10/04/2019 CLINICAL DATA:  Evaluate liver lesions and upper abdominal adenopathy seen on recent chest CT. EXAM: MRI ABDOMEN WITHOUT AND WITH CONTRAST TECHNIQUE: Multiplanar multisequence MR imaging of the abdomen was performed both before and after the administration of intravenous contrast. CONTRAST:  43mL GADAVIST GADOBUTROL 1 MMOL/ML IV SOLN COMPARISON:  CT abdomen/pelvis 08/15/2018 FINDINGS: Lower chest: The lungs demonstrate changes of COVID pneumonia as demonstrated on today's chest CT. Hepatobiliary: Numerous diffusion positive hepatic metastatic lesions are noted. Segment 4A lesion measures 3.7 cm on image 7/8. 5 cm lesion and segment 7 on image 12/8. 5 cm lesion in segment 6 on image 18/8. Several other smaller lesions are noted. No intrahepatic biliary dilatation. There is diffuse fatty infiltration of the liver noted. A 2 cm gallstone is noted in the gallbladder. No common bile duct dilatation. Pancreas:  No mass, inflammation or ductal dilatation. Spleen:  Normal size. No focal lesions. Adrenals/Urinary Tract: The adrenal glands and kidneys are unremarkable. Stomach/Bowel: There is a large infiltrating gastric mass involving the fundal region 6.5 x 4.8 cm and is diffusion positive. Moderate contrast enhancement is noted. Adjacent gastrohepatic ligament lymphadenopathy with the largest node  measuring 4.6 cm. Vascular/Lymphatic: The aorta is normal in caliber. No dissection. The branch vessels are patent. No retroperitoneal lymphadenopathy. Other:  No ascites or abdominal wall hernia. Musculoskeletal: No worrisome bone lesions. IMPRESSION: 1. 6.5 x 4.8 cm infiltrating gastric mass involving the fundal region. Associated gastrohepatic ligament lymphadenopathy. 2. Numerous hepatic metastatic lesions. 3. Cholelithiasis. Electronically Signed   By: Marijo Sanes M.D.   On: 10/04/2019 06:26   US RENAL  Result Date: 10/13/2019 CLINICAL DATA:  Acute kidney injury, recent discovery of abdominal and liver masses. EXAM: RENAL / URINARY TRACT ULTRASOUND COMPLETE COMPARISON:  MRI 10/04/2019 and CT 10/11/2019 FINDINGS: Right Kidney: Renal measurements: 12.9 x 5.6 x 5.7 cm = volume: 213 mL. Echogenicity within normal limits. No mass or hydronephrosis visualized. Left Kidney: Renal measurements: 14 x 6.3 x 5.6 cm = volume:  258 mL. Echogenicity within normal limits. No mass or hydronephrosis visualized. Bladder: Foley catheter in the urinary bladder. Other: Incidental liver masses in the RIGHT hepatic lobe measuring 4.3 x 3.4 x 3.6 cm and 6.3 x 4.4 x 5.6 cm, grossly similar accounting for differences in angle of measurement and technique as compared to recent imaging studies. One of these measurements may combine the dimensions of 2 adjacent masses, would refer to previous CT and MRI for follow-up measurements. IMPRESSION: 1. No hydronephrosis. 2. Hepatic metastatic disease better visualized on recent CTs. Electronically Signed   By: Zetta Bills M.D.   On: 10/13/2019 14:20   DG CHEST PORT 1 VIEW  Result Date: 10/13/2019 CLINICAL DATA:  History of COVID-19 positivity, status post left PICC line placement EXAM: PORTABLE CHEST 1 VIEW COMPARISON:  09/28/2019 FINDINGS: Cardiac shadow is stable. Patchy airspace opacities are noted in both lungs increased from the prior exam particularly in the left retrocardiac  region consistent with progressive COVID-19 pneumonia. Left-sided PICC line is seen with the catheter tip at the cavoatrial junction. No bony abnormality is noted. IMPRESSION: PICC line in satisfactory position. Increase in the degree of bilateral patchy airspace opacity consistent with COVID-19 positivity. Electronically Signed   By: Inez Catalina M.D.   On: 10/13/2019 20:06   DG Chest Port 1 View  Result Date: 09/28/2019 CLINICAL DATA:  COVID EXAM: PORTABLE CHEST 1 VIEW COMPARISON:  09/24/2019 FINDINGS: Development of patchy bilateral airspace opacities. No pleural effusion. Stable cardiomediastinal silhouette. No pneumothorax. IMPRESSION: Development of patchy bilateral airspace opacities, consistent with bilateral pneumonia. Electronically Signed   By: Donavan Foil M.D.   On: 09/28/2019 23:14   DG Chest Port 1 View  Result Date: 09/24/2019 CLINICAL DATA:  COVID pneumonia, weakness, fatigue EXAM: PORTABLE CHEST 1 VIEW COMPARISON:  09/15/2019 FINDINGS: The lungs are symmetrically expanded. Minimal left basilar atelectasis is again noted. No superimposed confluent pulmonary infiltrate. No pneumothorax or pleural effusion. Cardiac size within normal limits. The pulmonary vascularity is normal. No acute bone abnormality. IMPRESSION: Minimal left basilar atelectasis, unchanged. No superimposed confluent pulmonary infiltrate. Electronically Signed   By: Fidela Salisbury MD   On: 09/24/2019 20:15   ECHOCARDIOGRAM COMPLETE  Result Date: 09/30/2019    ECHOCARDIOGRAM REPORT   Patient Name:   JAMAS JAQUAY Date of Exam: 09/30/2019 Medical Rec #:  [id number]         Height:       72.0 in Accession #:    [id number]        Weight:       237.0 lb Date of Birth:  06-Feb-1955        BSA:          2.290 m Patient Age:    75 years          BP:           95/60 mmHg Patient Gender: M                 HR:           126 bpm. Exam Location:  [facility] Procedure: 2D Echo Indications:    Endocarditis I38  History:        Patient  has no prior history of Echocardiogram examinations.                 Risk Factors:Non-Smoker. Pneumonia due to COVID-19 virus,                 Sepsis, Lactic Acidosis.  Sonographer:    Leavy Cella RDCS (AE) Referring Phys: [id number] OLADAPO ADEFESO IMPRESSIONS  1. Left ventricular ejection fraction, by estimation, is 55 to 60%. The left ventricle has normal function. The left ventricle has no regional wall motion abnormalities. There is moderate left ventricular hypertrophy. Left ventricular diastolic parameters are indeterminate.  2. Right ventricular systolic function is normal. The right ventricular size is normal. Tricuspid regurgitation signal is inadequate for assessing PA pressure.  3. The mitral valve is grossly normal. No evidence of mitral valve regurgitation.  4. The aortic valve is tricuspid. Aortic valve regurgitation is not visualized.  5. The inferior vena cava is normal in size with <50% respiratory variability, suggesting right atrial pressure of 8 mmHg.  6. Views are somewhat limited, but no definite valvular vegetations are visualized. FINDINGS  Left Ventricle: Left ventricular ejection fraction, by estimation, is 55 to 60%. The left ventricle has normal function. The left ventricle has no regional wall motion abnormalities. The left ventricular internal cavity size was normal in size. There is  moderate left ventricular hypertrophy. Left ventricular diastolic parameters are indeterminate. Right Ventricle: The right ventricular size is normal. No increase in right ventricular wall thickness. Right ventricular systolic function is normal. Tricuspid regurgitation signal is inadequate for assessing PA pressure. Left Atrium: Left atrial size was normal in size. Right Atrium: Right atrial size was normal in size. Pericardium: There is no evidence of pericardial effusion. Mitral Valve: The mitral valve is grossly normal. No evidence of mitral valve regurgitation. Tricuspid Valve: The tricuspid valve  is grossly normal. Tricuspid valve regurgitation is trivial. Aortic Valve: The aortic valve is tricuspid. Aortic valve regurgitation is not visualized. Pulmonic Valve: The pulmonic valve was grossly normal. Pulmonic valve regurgitation is trivial. Aorta: The aortic root is normal in size and structure. Venous: The inferior vena cava is normal in size with less than 50% respiratory variability, suggesting right atrial pressure of 8 mmHg. IAS/Shunts: No atrial level shunt detected by color flow Doppler.  LEFT VENTRICLE PLAX 2D LVIDd:         4.37 cm Diastology LVIDs:         2.20 cm LV e' medial:    7.18 cm/s LV PW:         1.72 cm LV E/e' medial:  9.2 LV IVS:        1.46 cm LV e' lateral:   12.90 cm/s                        LV E/e' lateral: 5.1  RIGHT VENTRICLE RV S prime:     16.00 cm/s TAPSE (M-mode): 1.5 cm LEFT ATRIUM             Index       RIGHT ATRIUM           Index LA diam:        3.70 cm 1.62 cm/m  RA Area:     15.40 cm LA Vol (A2C):   63.9 ml 27.90 ml/m RA Volume:   39.80 ml  17.38 ml/m LA Vol (A4C):   40.6 ml 17.73 ml/m LA Biplane Vol: 52.4 ml 22.88 ml/m   AORTA Ao Root diam: 3.50 cm MITRAL VALVE MV Area (PHT): 6.60 cm MV Decel Time: 115 msec MV E velocity: 66.00 cm/s MV A velocity: 39.80 cm/s MV E/A ratio:  1.66 Rozann Lesches MD Electronically signed by Rozann Lesches MD Signature Date/Time: 09/30/2019/4:58:16 PM    Final  [place] EKG SITE RITE  Result Date: 10/13/2019 If Site Rite image not attached, placement could not be confirmed due to current cardiac rhythm.   Roxan Hockey, MD  [facility]  If 7PM-7AM, please contact night-coverage [email or web address]  10/16/2019, 2:04 PM   LOS: 18 days

## 2019-10-16 NOTE — Progress Notes (Signed)
-  Patient failed voiding trial on 10/16/2019  bladder scan showed 472 ccs   --Foley catheter reinserted  Roxan Hockey, MD

## 2019-10-17 DIAGNOSIS — R6521 Severe sepsis with septic shock: Secondary | ICD-10-CM | POA: Clinically undetermined

## 2019-10-17 LAB — CBC WITH DIFFERENTIAL/PLATELET
Abs Immature Granulocytes: 0.06 10*3/uL (ref 0.00–0.07)
Basophils Absolute: 0 10*3/uL (ref 0.0–0.1)
Basophils Relative: 0 %
Eosinophils Absolute: 0 10*3/uL (ref 0.0–0.5)
Eosinophils Relative: 0 %
HCT: 29 % — ABNORMAL LOW (ref 39.0–52.0)
Hemoglobin: 8.6 g/dL — ABNORMAL LOW (ref 13.0–17.0)
Immature Granulocytes: 1 %
Lymphocytes Relative: 3 %
Lymphs Abs: 0.3 10*3/uL — ABNORMAL LOW (ref 0.7–4.0)
MCH: 24.6 pg — ABNORMAL LOW (ref 26.0–34.0)
MCHC: 29.7 g/dL — ABNORMAL LOW (ref 30.0–36.0)
MCV: 83.1 fL (ref 80.0–100.0)
Monocytes Absolute: 0.4 10*3/uL (ref 0.1–1.0)
Monocytes Relative: 4 %
Neutro Abs: 9.5 10*3/uL — ABNORMAL HIGH (ref 1.7–7.7)
Neutrophils Relative %: 92 %
Platelets: 76 10*3/uL — ABNORMAL LOW (ref 150–400)
RBC: 3.49 MIL/uL — ABNORMAL LOW (ref 4.22–5.81)
RDW: 20.3 % — ABNORMAL HIGH (ref 11.5–15.5)
WBC: 10.3 10*3/uL (ref 4.0–10.5)
nRBC: 0 % (ref 0.0–0.2)

## 2019-10-17 LAB — BASIC METABOLIC PANEL
Anion gap: 7 (ref 5–15)
BUN: 19 mg/dL (ref 8–23)
CO2: 31 mmol/L (ref 22–32)
Calcium: 7.5 mg/dL — ABNORMAL LOW (ref 8.9–10.3)
Chloride: 97 mmol/L — ABNORMAL LOW (ref 98–111)
Creatinine, Ser: 0.58 mg/dL — ABNORMAL LOW (ref 0.61–1.24)
GFR calc Af Amer: >60 mL/min (ref >60)
GFR calc non Af Amer: >60 mL/min (ref >60)
Glucose, Bld: 178 mg/dL — ABNORMAL HIGH (ref 70–99)
Potassium: 3.7 mmol/L (ref 3.5–5.1)
Sodium: 135 mmol/L (ref 135–145)

## 2019-10-17 LAB — GLUCOSE, CAPILLARY
Glucose-Capillary: 156 mg/dL — ABNORMAL HIGH (ref 70–99)
Glucose-Capillary: 167 mg/dL — ABNORMAL HIGH (ref 70–99)
Glucose-Capillary: 148 mg/dL — ABNORMAL HIGH (ref 70–99)
Glucose-Capillary: 136 mg/dL — ABNORMAL HIGH (ref 70–99)

## 2019-10-17 MED ORDER — PROSOURCE PLUS PO LIQD
30.0000 mL | Freq: Three times a day (TID) | ORAL | Status: DC
  Administered 2019-10-17 – 2019-10-25 (×20): 30 mL via ORAL
  Filled 2019-10-17 (×16): qty 30

## 2019-10-17 MED ORDER — OCUVITE-LUTEIN PO CAPS
1.0000 | ORAL_CAPSULE | Freq: Every day | ORAL | Status: DC
  Administered 2019-10-17 – 2019-10-25 (×9): 1 via ORAL
  Filled 2019-10-17 (×8): qty 1

## 2019-10-17 NOTE — Progress Notes (Signed)
PROGRESS NOTE  Stephen Wilcox GYI:[id number] DOB: 1955-12-19 DOA: 09/28/2019 PCP: Jake Samples, PA-C  Brief History:  64 year old male without any documented significant chronic medical problems presenting to the ED on 09/24/2019 with lightheadedness malaise, and some generalized weakness.  Patient was diagnosed with positive Covid.  He had declined monoclonal antibody infusion.  On 09/28/2019, the patient presented back to the emergency department with worsening generalized weakness, dizziness, and cough and malaise.  He was noted to have oxygen saturation of 82% room air and placed on 2 L nasal cannula.  Was subsequently started on remdesivir and IV steroids.  At the time of admission, the patient was noted to have a hemoglobin of 7.3 which was a significant drop when compared to 09/24/2019 when his hemoglobin was 9.1.  Hemoccult was noted to be positive during admission.  GI was consulted to assist with management.  Dr. Laural Golden saw the patient.  He did not feel that the patient had any overt active GI bleeding.  He recommended PPI infusion for 72 hours and subsequently transitioned to oral Protonix.  He recommended endoscopic evaluation if the patient's hemoglobin continued to drop again requiring transfusion.  The patient was transfused 2 units PRBC.  His hemoglobin went up to 9.3.  The patient's oxygen demand gradually increased to 6 L, but subsequently remained stable.  Blood cultures grew Staph epidermidis in 1 of 2 sets.  This was thought to be a contaminant.  Empiric antibiotics were discontinued.  On 09/29/2019, the patient was noted to have atrial fibrillation.  EKG confirmed atrial fibrillation.  The patient was started on oral diltiazem.  Echocardiogram on 09/30/2019 showed EF 55 to 60%, no WMA.  Assessment/Plan: Severe Sepsis with Septic Shock Secondary to E. coli infection with Acute Hypotension/Elevated Lactic Acid/Leukocytosis -In retrospect severe sepsis with septic shock was  most likely NOT present on admission -Afebrile -elevated WBC's, lactic acid and episode of hypoglycemia. -Hemodynamic instability oxygen resolved, patient weaned off IV phenylephrine / pressure support on 10/14/2019 -Urine culture from 10/12/2019 with E. coli that is resistant to penicillins, okay to de-escalate from Vanco and cefepime to Rocephin -Flagyl discontinued -Blood cultures from 10/13/2019 NGTD - negative MRSA PCR -on 10/13/19 Clinically patient meets criteria for severe E. coli sepsis with hemodynamic instability as above -Lactic acid peaked at 3.6 PCT 125.85 WBC  52.6 >> 32.1 >>17.0 -Sepsis pathophysiology mostly resolved, current tachycardia is most likely secondary to atrial flutter rather than sepsis  Acute metabolic encephalopathy---resolved -In the setting of acute urinary retention and E Coli UTI/Sepsis -Patient mentation back to baseline and currently oriented x3.  Acute respiratory failure with hypoxia secondary to COVID-19 pneumonia -currently 3 to 4 L  Oxygen  -He has completed his course of steroids and remdesivir -Continue vitamin C and zinc -CRP 19.4>> 15.8>> 7.9>> 3.6>> 1.7>> 1.0>>0.8 -Ferritin 59>> 70>> 95>> 90>> 67>> 53>>50 -D-dimer 1.89>> 1.48>> 1.26>> 1.13>> 1.31>> 1.48>>1.71 -10/03/19 CTA chest--no PE; 4.8 cm lesion in the right lobe liver, incompletely characterized. 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 Delton Coombes who will follow him up as an  outpatient -Requested that CT abdomen and pelvis with contrast be performed for to complete staging process -Also requested possible port placement by general surgery for chemotherapy -Regarding nutrition, will follow feeding supplements recommended by dietitian; continue calorie counting. -If patient is unable to maintain 1800 -calorie/day intake on full liquid diet, may need to consider PEG tube placement to supplement nutrition. -Port-A-Cath placement on hold due to hemodynamic instability concerns -Patient currently has left arm PICC line  Symptomatic Anemia/heme positive stool -Appreciate GI consult -Dr. Laural Golden followed the patient--> -Transfused 4 units PRBC during this hospitalization; last 2 units ordered on 10/13/2019 in the setting of acute drop in his hemoglobin count; part of these secondary to hemodilution after aggressive fluid resuscitation).. -in retrospect, gastric mass may have been source of bleed -Continue to follow hemoglobin trend and further transfuse as needed for hemoglobin less than 7. -Continue p.o. Protonix  New onset atrial fibrillation---With persistent RVR -09/30/2019 echo EF 55 to 60%, no WMA -CHA2DS2-VASc = 1 -TSH--0.733 -Will continue holding on anticoagulation/aspirin in light of high risk for bleeding from gastric lesion. --Becomes tachycardic with attempt to wean off IV Cardizem drip   -BP to soft to add metoprolol so added digoxin on 10/14/2019  Diabetes mellitus type 2 -09/28/2019 hemoglobin A1c 6.9 Use Novolog/Humalog Sliding scale insulin with Accu-Cheks/Fingersticks as ordered -Holding a nightly sliding scale insulin at this time.  Microcytic anemia/Iron deficiency -iron saturation 3% -ferritin low despite COVID-19 -Case discussed with Dr. Delton Coombes who has recommended initiation of Niferex -Once medically stable IV iron infusion also recommended. -Continue transfusion as needed.  Class II obesity -BMI 32.14---this increases overall  morbidity and mortality -Lifestyle modification, portion control, low calorie diet recommended after resolution of acute illness  Acute kidney injury -Renal ultrasound without hydronephrosis or obstructive uropathy -Foley catheter placed -Urine culture with E. coli as above #1 -Renal function improved/normalized with IV fluids and treatment of E. coli UTI --Renally adjust medications, avoid nephrotoxic agents / dehydration  / hypotension -Creatinine has normalized (peaked at 2.0), baseline usually around 0.7  BPH/urinary retention -Continue Flomax -Foley removed on 10/16/2019, patient failed voiding trial and Foley was reinserted on 10/16/2019 -Renal ultrasound without obstructive uropathy or hydronephrosis  Bacteremia -Blood cultures from 09/28/2019 Staph epidermidis 1 out of 2 sets -Represents contaminant -Repeat blood cultures from 10/13/2019 NGTD  Generalized Weakness and Deconditioning -PT evaluation--> skilled nursing facility with which the patient and daughter agree.  Anorexia/Adjustment Disorder--- c/n Remeron 15 mg qhs  Status is: Inpatient  Remains inpatient appropriate because: Unable to wean off IV Cardizem drip, continues to require IV antibiotics for E. coli sepsis   disposition: The patient is from: Home  Anticipated d/c is to: SNF  Anticipated d/c date is: to be determined  Patient currently is not medically stable to d/c.  Unable to wean off IV Cardizem drip, continues to require IV antibiotics for E. coli sepsis   Family Communication:   Discussed with Daughter [name] at [phone]  Consultants:  GI--Rehman  Code Status:  FULL   DVT Prophylaxis:  SCDs (Gi Bleed)  Procedures: As Listed in Progress Note Above  Antibiotics: None  Subjective: - 10/17/19 No fevers no vomiting or diarrhea --Denies chest pains or dizziness -Tachycardia persist  -Appetite remains poor  Objective: Vitals:    10/17/19 0330 10/17/19 0400 10/17/19 0500 10/17/19 0600  BP: 135/81 137/68 133/77 108/87  Pulse: 90 100 (!) 107 92  Resp: 15 16 17  16  Temp:  98.6 F (37 C)    TempSrc:      SpO2: 96% 96% 96% 95%  Weight:   110 kg   Height:        Intake/Output Summary (Last 24 hours) at 10/17/2019 2694 Last data filed at 10/16/2019 1817 Gross per 24 hour  Intake 364.29 ml  Output 400 ml  Net -35.71 ml   Weight change:   Exam: General exam: Alert, awake, oriented x 3; obese, no conversational dyspnea Nose- [place] 3.5 L/min Respiratory system: No wheezing, few scattered rhonchi, fair air movement Cardiovascular system: Irregular irregular, tachy gastrointestinal system: Abdomen is nondistended, soft and without guarding. . Normal bowel sounds heard. Neuro=: Generalized weakness,No focal neurological deficits. Extremities: No cyanosis or clubbing, left arm PICC line in situ Skin: No rashes, no petechiae. Psychiatry: Judgement and insight appear normal.  Flat affect and depressed mood appreciated.  No suicidal ideation or hallucination. GU-Foley catheter in situ   Data Reviewed: I have personally reviewed following labs and imaging studies  Basic Metabolic Panel: Recent Labs  Lab 10/12/19 0611 10/13/19 0911 10/13/19 1209 10/15/19 0435 10/17/19 0847  NA 131* 132* 133* 137 135  K 4.7 4.8 4.5 3.6 3.7  CL 97* 101 101 100 97*  CO2 26 22 21* 28 31  GLUCOSE 138* 203* 189* 164* 178*  BUN 20 27* 26* 22 19  CREATININE 1.51* 2.00* 1.77* 0.86 0.58*  CALCIUM 7.7* 6.8* 7.0* 7.6* 7.5*  MG  --   --   --  1.8  --    Liver Function Tests: Recent Labs  Lab 10/12/19 0611 10/13/19 0911  AST 30 507*  ALT 39 313*  ALKPHOS 69 99  BILITOT 0.8 1.2  PROT 4.6* 4.5*  ALBUMIN 2.1* 2.2*   CBC: Recent Labs  Lab 10/11/19 0653 10/12/19 0611 10/13/19 0911 10/14/19 0848 10/15/19 0435  WBC 13.6* 15.3* 52.6* 32.1* 17.0*  NEUTROABS  --   --  47.9*  --   --   HGB 7.6* 7.8* 6.4* 8.1* 8.3*  HCT 26.0* 27.0*  21.6* 25.5* 26.5*  MCV 80.7 80.1 80.0 80.4 81.5  PLT 241 264 142* 97* 86*   CBG: Recent Labs  Lab 10/16/19 0736 10/16/19 1142 10/16/19 1630 10/16/19 2022 10/17/19 0729  GLUCAP 171* 172* 210* 223* 156*   Urine analysis:    Component Value Date/Time   COLORURINE YELLOW 10/12/2019 1544   APPEARANCEUR HAZY (A) 10/12/2019 1544   LABSPEC 1.014 10/12/2019 1544   PHURINE 5.0 10/12/2019 1544   GLUCOSEU NEGATIVE 10/12/2019 1544   HGBUR MODERATE (A) 10/12/2019 1544   BILIRUBINUR NEGATIVE 10/12/2019 1544   KETONESUR NEGATIVE 10/12/2019 1544   PROTEINUR NEGATIVE 10/12/2019 1544   NITRITE NEGATIVE 10/12/2019 1544   LEUKOCYTESUR NEGATIVE 10/12/2019 1544   Sepsis Labs: Recent Results (from the past 240 hour(s))  Urine Culture     Status: Abnormal   Collection Time: 10/12/19  3:44 PM   Specimen: Urine, Random  Result Value Ref Range Status   Specimen Description   Final    URINE, RANDOM Performed at [facility], [address], [place], [place] [address]    Special Requests   Final    NONE Performed at [facility], [address] [place], [place] [address]    Culture >=100,000 COLONIES/mL ESCHERICHIA COLI (A)  Final   Report Status 10/14/2019 FINAL  Final   Organism ID, Bacteria ESCHERICHIA COLI (A)  Final      Susceptibility   Escherichia coli - MIC*  AMPICILLIN >=32 RESISTANT Resistant     CEFAZOLIN <=4 SENSITIVE Sensitive     CEFTRIAXONE <=0.25 SENSITIVE Sensitive     CIPROFLOXACIN <=0.25 SENSITIVE Sensitive     GENTAMICIN <=1 SENSITIVE Sensitive     IMIPENEM <=0.25 SENSITIVE Sensitive     NITROFURANTOIN <=16 SENSITIVE Sensitive     TRIMETH/SULFA <=20 SENSITIVE Sensitive     AMPICILLIN/SULBACTAM >=32 RESISTANT Resistant     PIP/TAZO <=4 SENSITIVE Sensitive     * >=100,000 COLONIES/mL ESCHERICHIA COLI  MRSA PCR Screening     Status: None   Collection Time: 10/13/19  4:08 AM   Specimen: Nasopharyngeal  Result Value Ref Range Status   MRSA by PCR NEGATIVE NEGATIVE  Final    Comment:        The GeneXpert MRSA Assay (FDA approved for NASAL specimens only), is one component of a comprehensive MRSA colonization surveillance program. It is not intended to diagnose MRSA infection nor to guide or monitor treatment for MRSA infections. Performed at [facility], [address] [place], [place] [address]   Culture, blood (Routine X 2) w Reflex to ID Panel     Status: None (Preliminary result)   Collection Time: 10/13/19  6:36 PM   Specimen: BLOOD LEFT HAND  Result Value Ref Range Status   Specimen Description BLOOD LEFT HAND  Final   Special Requests   Final    BOTTLES DRAWN AEROBIC ONLY Blood Culture adequate volume   Culture   Final    NO GROWTH 3 DAYS Performed at [facility], [address] [place], [place] [address]    Report Status PENDING  Incomplete  Culture, blood (Routine X 2) w Reflex to ID Panel     Status: None (Preliminary result)   Collection Time: 10/13/19  6:36 PM   Specimen: BLOOD LEFT HAND  Result Value Ref Range Status   Specimen Description BLOOD LEFT HAND  Final   Special Requests   Final    BOTTLES DRAWN AEROBIC AND ANAEROBIC Blood Culture adequate volume   Culture   Final    NO GROWTH 3 DAYS Performed at [facility], [address] [place], [place] [address]    Report Status PENDING  Incomplete     Scheduled Meds: . sodium chloride   Intravenous Once  . vitamin C  500 mg Oral Daily  . busPIRone  5 mg Oral TID  . Chlorhexidine Gluconate Cloth  6 each Topical Daily  . digoxin  0.125 mg Oral Daily  . hyoscyamine  0.25 mg Sublingual TID AC  . insulin aspart  0-20 Units Subcutaneous TID WC  . iron polysaccharides  150 mg Oral Daily  . mirtazapine  15 mg Oral QHS  . pantoprazole  40 mg Oral BID AC  . sodium chloride flush  10-40 mL Intracatheter Q12H  . tamsulosin  0.4 mg Oral QPC supper  . zinc sulfate  220 mg Oral Daily   Continuous Infusions: . sodium chloride    . cefTRIAXone (ROCEPHIN)  IV 2 g (10/16/19  2026)  . diltiazem (CARDIZEM) infusion 10 mg/hr (10/17/19 0420)  . lactated ringers 20 mL/hr at 10/16/19 1607    Procedures/Studies: CT ANGIO CHEST PE W OR WO CONTRAST  Result Date: 10/03/2019 CLINICAL DATA:  COVID-19 positive, weakness, dizziness and shortness of breath EXAM: CT ANGIOGRAPHY CHEST WITH CONTRAST TECHNIQUE: Multidetector CT imaging of the chest was performed using the standard protocol during bolus administration of intravenous contrast. Multiplanar CT image reconstructions and MIPs were obtained to evaluate  the vascular anatomy. CONTRAST:  155mL OMNIPAQUE IOHEXOL 350 MG/ML SOLN COMPARISON:  Radiograph 09/28/2019, CT abdomen pelvis 08/15/2018 FINDINGS: Cardiovascular: Satisfactory opacification the pulmonary arteries to the segmental level. No pulmonary artery filling defects are identified. Central pulmonary arteries are normal caliber. Suboptimal opacification of the thoracic aorta. No discernible acute abnormality of the thoracic aorta proximal great vessels. Aberrant right subclavian artery. Borderline cardiac enlargement. Slight reflux of contrast into the IVC. Mediastinum/Nodes: No mediastinal fluid or gas. Normal thyroid gland and thoracic inlet. No acute abnormality of the trachea or esophagus. No worrisome mediastinal, hilar or axillary adenopathy. Lungs/Pleura: Mixed areas of heterogeneous consolidative and ground-glass opacity throughout both lungs with a basilar and peripheral predominance. No pneumothorax or visible effusion. Diffuse airways thickening. Upper Abdomen: Indeterminate intermediate attenuation (38 HU) lesion measuring up to 8.5 cm in maximum transaxial dimension within the right lobe liver (4/80). Cholelithiasis with a partially calcified gallstone towards the gallbladder neck. Multiple soft tissue attenuation lesions present along the lesser curvature/gastrohepatic ligament. Largest measuring up to 4.6 cm in size (4/91). Partially exophytic 0.7 cm lesion arising from  the upper pole left kidney, corresponding with a previously seen fluid attenuation cyst on prior comparison CT, could reflect intracystic hemorrhage/proteinaceous debris. Musculoskeletal: Multilevel degenerative changes are present in the imaged portions of the spine. No acute osseous abnormality or suspicious osseous lesion. Probable intramuscular lipoma of the right infraspinatus measuring up to 6.9 by 3.5 cm (4/20). No other acute or worrisome chest wall lesions. Review of the MIP images confirms the above findings. IMPRESSION: 1. No evidence of acute pulmonary artery filling defects. 2. Mixed areas of heterogeneous consolidative and ground-glass opacity throughout both lungs with a basilar and peripheral predominance. Findings are compatible with multifocal pneumonia compatible with a COVID-19 etiology. 3. Indeterminate 4.8 cm lesion in the right lobe liver, incompletely characterized. Additional multiple soft tissue attenuation lesions along the lesser curvature/gastrohepatic ligament, largest measuring up to 4.6 cm in size. These findings are new from comparison abdominal CT 08/15/2018 and could raise concern for potential malignancy. Could consider further evaluation with abdominal MR with contrast. 4. Additional 7 mm hyperdense lesion arising from the upper pole left kidney, could reflect proteinaceous cysts, Ob also be better evaluated on dedicated abdominal imaging. 5. Cholelithiasis. 6. Aberrant right subclavian artery. These results will be called to the ordering clinician or representative by the Radiologist Assistant, and communication documented in the PACS or [employer]. Electronically Signed   By: Lovena Le M.D.   On: 10/03/2019 15:38   CT ABDOMEN PELVIS W CONTRAST  Result Date: 10/11/2019 CLINICAL DATA:  COVID positive. EXAM: CT ABDOMEN AND PELVIS WITH CONTRAST TECHNIQUE: Multidetector CT imaging of the abdomen and pelvis was performed using the standard protocol following bolus  administration of intravenous contrast. CONTRAST:  74mL OMNIPAQUE IOHEXOL 300 MG/ML  SOLN COMPARISON:  August 15, 2018 FINDINGS: Lower chest: Marked severity bilateral lower lobe infiltrates are seen. Hepatobiliary: Multiple large heterogeneous low-attenuation liver masses are seen. The largest measures approximately 6.0 cm x 5.3 cm x 6.1 cm. A 1.7 cm gallstone is seen within the neck of an otherwise normal-appearing gallbladder. Pancreas: Unremarkable. No pancreatic ductal dilatation or surrounding inflammatory changes. Spleen: Normal in size without focal abnormality. Adrenals/Urinary Tract: Adrenal glands are unremarkable. Kidneys are normal in size, without renal calculi or hydronephrosis. A stable 1.2 cm diameter area of heterogeneous low attenuation is seen within the medial aspect of the mid to lower right kidney. A stable 1.2 cm cyst is seen along the posterior  aspect of the upper pole of the left kidney. Bladder is unremarkable. Stomach/Bowel: A 3.0 cm x 8.7 cm x 6.3 cm heterogeneous soft tissue mass is seen extending from the wall of the lesser sac of the stomach into the gastric lumen (axial CT images 15 through 25, CT series number 2). Appendix appears normal. There is no evidence of bowel dilatation. Numerous diverticula are seen throughout the descending and sigmoid colon. Moderate severity thickening of the proximal sigmoid colon is noted (best seen on coronal reformatted images 43 through 50, CT series number 5). Vascular/Lymphatic: No significant vascular findings are present. Reproductive: The prostate gland is markedly enlarged. Other: Multiple round heterogeneous soft tissue masses of various sizes are seen within the upper abdomen, adjacent to the lesser sac of the stomach. The largest measures approximately 5.2 cm x 5.6 cm x 4.6 cm. Musculoskeletal: Multilevel degenerative changes are seen throughout the lumbar spine. IMPRESSION: 1. Marked severity bilateral lower lobe infiltrates. 2. Multiple  large heterogeneous low-attenuation liver masses, consistent with metastatic disease. 3. Large gastric mass, suspicious for primary neoplasm. 4. Multiple soft tissue masses within the mesentery of the upper abdomen, adjacent to the lesser sac of the stomach, consistent with metastatic disease. 5. Colonic diverticulosis. 6. Thickening of the proximal sigmoid colon which may represent sequelae associated with mild colitis/diverticulitis correlation with follow-up abdomen pelvis CT is recommended to exclude the presence of an underlying neoplasm. 7. Stable area of low attenuation within the right kidney. While this may represent a hemorrhagic cyst, correlation with renal ultrasound is recommended. 8. Cholelithiasis. Electronically Signed   By: Virgina Norfolk M.D.   On: 10/11/2019 23:24   MR LIVER W WO CONTRAST  Result Date: 10/04/2019 CLINICAL DATA:  Evaluate liver lesions and upper abdominal adenopathy seen on recent chest CT. EXAM: MRI ABDOMEN WITHOUT AND WITH CONTRAST TECHNIQUE: Multiplanar multisequence MR imaging of the abdomen was performed both before and after the administration of intravenous contrast. CONTRAST:  64mL GADAVIST GADOBUTROL 1 MMOL/ML IV SOLN COMPARISON:  CT abdomen/pelvis 08/15/2018 FINDINGS: Lower chest: The lungs demonstrate changes of COVID pneumonia as demonstrated on today's chest CT. Hepatobiliary: Numerous diffusion positive hepatic metastatic lesions are noted. Segment 4A lesion measures 3.7 cm on image 7/8. 5 cm lesion and segment 7 on image 12/8. 5 cm lesion in segment 6 on image 18/8. Several other smaller lesions are noted. No intrahepatic biliary dilatation. There is diffuse fatty infiltration of the liver noted. A 2 cm gallstone is noted in the gallbladder. No common bile duct dilatation. Pancreas:  No mass, inflammation or ductal dilatation. Spleen:  Normal size. No focal lesions. Adrenals/Urinary Tract: The adrenal glands and kidneys are unremarkable. Stomach/Bowel: There  is a large infiltrating gastric mass involving the fundal region 6.5 x 4.8 cm and is diffusion positive. Moderate contrast enhancement is noted. Adjacent gastrohepatic ligament lymphadenopathy with the largest node measuring 4.6 cm. Vascular/Lymphatic: The aorta is normal in caliber. No dissection. The branch vessels are patent. No retroperitoneal lymphadenopathy. Other:  No ascites or abdominal wall hernia. Musculoskeletal: No worrisome bone lesions. IMPRESSION: 1. 6.5 x 4.8 cm infiltrating gastric mass involving the fundal region. Associated gastrohepatic ligament lymphadenopathy. 2. Numerous hepatic metastatic lesions. 3. Cholelithiasis. Electronically Signed   By: Marijo Sanes M.D.   On: 10/04/2019 06:26   US RENAL  Result Date: 10/13/2019 CLINICAL DATA:  Acute kidney injury, recent discovery of abdominal and liver masses. EXAM: RENAL / URINARY TRACT ULTRASOUND COMPLETE COMPARISON:  MRI 10/04/2019 and CT 10/11/2019 FINDINGS: Right Kidney: Renal  measurements: 12.9 x 5.6 x 5.7 cm = volume: 213 mL. Echogenicity within normal limits. No mass or hydronephrosis visualized. Left Kidney: Renal measurements: 14 x 6.3 x 5.6 cm = volume: 258 mL. Echogenicity within normal limits. No mass or hydronephrosis visualized. Bladder: Foley catheter in the urinary bladder. Other: Incidental liver masses in the RIGHT hepatic lobe measuring 4.3 x 3.4 x 3.6 cm and 6.3 x 4.4 x 5.6 cm, grossly similar accounting for differences in angle of measurement and technique as compared to recent imaging studies. One of these measurements may combine the dimensions of 2 adjacent masses, would refer to previous CT and MRI for follow-up measurements. IMPRESSION: 1. No hydronephrosis. 2. Hepatic metastatic disease better visualized on recent CTs. Electronically Signed   By: Zetta Bills M.D.   On: 10/13/2019 14:20   DG CHEST PORT 1 VIEW  Result Date: 10/13/2019 CLINICAL DATA:  History of COVID-19 positivity, status post left PICC line  placement EXAM: PORTABLE CHEST 1 VIEW COMPARISON:  09/28/2019 FINDINGS: Cardiac shadow is stable. Patchy airspace opacities are noted in both lungs increased from the prior exam particularly in the left retrocardiac region consistent with progressive COVID-19 pneumonia. Left-sided PICC line is seen with the catheter tip at the cavoatrial junction. No bony abnormality is noted. IMPRESSION: PICC line in satisfactory position. Increase in the degree of bilateral patchy airspace opacity consistent with COVID-19 positivity. Electronically Signed   By: Inez Catalina M.D.   On: 10/13/2019 20:06   DG Chest Port 1 View  Result Date: 09/28/2019 CLINICAL DATA:  COVID EXAM: PORTABLE CHEST 1 VIEW COMPARISON:  09/24/2019 FINDINGS: Development of patchy bilateral airspace opacities. No pleural effusion. Stable cardiomediastinal silhouette. No pneumothorax. IMPRESSION: Development of patchy bilateral airspace opacities, consistent with bilateral pneumonia. Electronically Signed   By: Donavan Foil M.D.   On: 09/28/2019 23:14   DG Chest Port 1 View  Result Date: 09/24/2019 CLINICAL DATA:  COVID pneumonia, weakness, fatigue EXAM: PORTABLE CHEST 1 VIEW COMPARISON:  09/15/2019 FINDINGS: The lungs are symmetrically expanded. Minimal left basilar atelectasis is again noted. No superimposed confluent pulmonary infiltrate. No pneumothorax or pleural effusion. Cardiac size within normal limits. The pulmonary vascularity is normal. No acute bone abnormality. IMPRESSION: Minimal left basilar atelectasis, unchanged. No superimposed confluent pulmonary infiltrate. Electronically Signed   By: Fidela Salisbury MD   On: 09/24/2019 20:15   ECHOCARDIOGRAM COMPLETE  Result Date: 09/30/2019    ECHOCARDIOGRAM REPORT   Patient Name:   VIBHAV WADDILL Date of Exam: 09/30/2019 Medical Rec #:  [id number]         Height:       72.0 in Accession #:    [id number]        Weight:       237.0 lb Date of Birth:  1955/06/04        BSA:          2.290 m  Patient Age:    27 years          BP:           95/60 mmHg Patient Gender: M                 HR:           126 bpm. Exam Location:  [facility] Procedure: 2D Echo Indications:    Endocarditis I38  History:        Patient has no prior history of Echocardiogram examinations.  Risk Factors:Non-Smoker. Pneumonia due to COVID-19 virus,                 Sepsis, Lactic Acidosis.  Sonographer:    Leavy Cella RDCS (AE) Referring Phys: [id number] OLADAPO ADEFESO IMPRESSIONS  1. Left ventricular ejection fraction, by estimation, is 55 to 60%. The left ventricle has normal function. The left ventricle has no regional wall motion abnormalities. There is moderate left ventricular hypertrophy. Left ventricular diastolic parameters are indeterminate.  2. Right ventricular systolic function is normal. The right ventricular size is normal. Tricuspid regurgitation signal is inadequate for assessing PA pressure.  3. The mitral valve is grossly normal. No evidence of mitral valve regurgitation.  4. The aortic valve is tricuspid. Aortic valve regurgitation is not visualized.  5. The inferior vena cava is normal in size with <50% respiratory variability, suggesting right atrial pressure of 8 mmHg.  6. Views are somewhat limited, but no definite valvular vegetations are visualized. FINDINGS  Left Ventricle: Left ventricular ejection fraction, by estimation, is 55 to 60%. The left ventricle has normal function. The left ventricle has no regional wall motion abnormalities. The left ventricular internal cavity size was normal in size. There is  moderate left ventricular hypertrophy. Left ventricular diastolic parameters are indeterminate. Right Ventricle: The right ventricular size is normal. No increase in right ventricular wall thickness. Right ventricular systolic function is normal. Tricuspid regurgitation signal is inadequate for assessing PA pressure. Left Atrium: Left atrial size was normal in size. Right Atrium: Right  atrial size was normal in size. Pericardium: There is no evidence of pericardial effusion. Mitral Valve: The mitral valve is grossly normal. No evidence of mitral valve regurgitation. Tricuspid Valve: The tricuspid valve is grossly normal. Tricuspid valve regurgitation is trivial. Aortic Valve: The aortic valve is tricuspid. Aortic valve regurgitation is not visualized. Pulmonic Valve: The pulmonic valve was grossly normal. Pulmonic valve regurgitation is trivial. Aorta: The aortic root is normal in size and structure. Venous: The inferior vena cava is normal in size with less than 50% respiratory variability, suggesting right atrial pressure of 8 mmHg. IAS/Shunts: No atrial level shunt detected by color flow Doppler.  LEFT VENTRICLE PLAX 2D LVIDd:         4.37 cm Diastology LVIDs:         2.20 cm LV e' medial:    7.18 cm/s LV PW:         1.72 cm LV E/e' medial:  9.2 LV IVS:        1.46 cm LV e' lateral:   12.90 cm/s                        LV E/e' lateral: 5.1  RIGHT VENTRICLE RV S prime:     16.00 cm/s TAPSE (M-mode): 1.5 cm LEFT ATRIUM             Index       RIGHT ATRIUM           Index LA diam:        3.70 cm 1.62 cm/m  RA Area:     15.40 cm LA Vol (A2C):   63.9 ml 27.90 ml/m RA Volume:   39.80 ml  17.38 ml/m LA Vol (A4C):   40.6 ml 17.73 ml/m LA Biplane Vol: 52.4 ml 22.88 ml/m   AORTA Ao Root diam: 3.50 cm MITRAL VALVE MV Area (PHT): 6.60 cm MV Decel Time: 115 msec MV E velocity: 66.00 cm/s MV A velocity:  39.80 cm/s MV E/A ratio:  1.66 Rozann Lesches MD Electronically signed by Rozann Lesches MD Signature Date/Time: 09/30/2019/4:58:16 PM    Final    [place] EKG SITE RITE  Result Date: 10/13/2019 If Site Rite image not attached, placement could not be confirmed due to current cardiac rhythm.   Roxan Hockey, MD  [facility]  If 7PM-7AM, please contact night-coverage [email or web address]  10/17/2019, 9:36 AM   LOS: 19 days

## 2019-10-17 NOTE — TOC Progression Note (Addendum)
Transition of Care [facility]) - Progression Note    Patient Details  Name: Stephen Wilcox MRN: [id number] Date of Birth: July 05, 1955  [facility] CM/SW Contact  Salome Arnt, [place] Phone Number: 10/17/2019, 10:26 AM  Clinical Narrative: Per Ebony Hail at [place] received Friday and is good through September 30. Pt is not medically ready. Ebony Hail updated.      Expected Discharge Plan: [place] Barriers to Discharge: Continued Medical Work up  Expected Discharge Plan and Services Expected Discharge Plan: [place] In-house Referral: Clinical Social Work   Post Acute Care Choice: [place] Living arrangements for the past 2 months: Apartment                                       Social Determinants of Health (SDOH) Interventions    Readmission Risk Interventions No flowsheet data found.

## 2019-10-17 NOTE — Progress Notes (Signed)
Nutrition Follow-up  DOCUMENTATION CODES:   Obesity unspecified  INTERVENTION:  48 hr calorie count: completed  Continue to encourage oral supplements   Add ProSource 30 ml TID  Assist with feeding as needed  If NGT becomes and option RD will provide further recommendations   Provide MVI daily  NUTRITION DIAGNOSIS:    Increased nutrient needs related to cancer and cancer related treatments, acute illness, chronic illness as evidenced by estimated needs.   GOAL:   Patient will meet greater than or equal to 90% of their needs  MONITOR:   PO intake, Labs, Supplement acceptance, Weight trends  REASON FOR ASSESSMENT:   Consult Calorie Count  ASSESSMENT:   Patient is an obese 64 yo male who presented with malaise generalized weakness, dizziness. Acute respiratory failure - COVID-19 pneumonia. He has completed remdesivir. Symptomatic anemia-2 units PRBC. New onset artrial fibrillation. Diabetes- A1C-6.9% on 09/28/19. Gastric and Hepatic masses.   10/12/19  A 48-hr Calorie count initiated due to poor oral intake, question if pt able ot maintain adequate intake via oral route. If patient is unable to maintain 1800 -calorie/day intake on full liquid diet, may need to consider PEG tube placement to supplement nutrition.   9/23 Patient moved to ICU for blood pressure monitoring. Port a cath placement cancelled. Calorie count was not moved with patient - therefore no data to assess.   9/24 Discussed pt with nursing. He has breakfast set up and in the process of eating. Nursing informed of calorie count. Envelop placed on door and nursing/nutrition staff aware. Meal intake the past 5 days 0-40% based on documentation. Suspect malnutrition in the patient. Will follow with results of calorie count Monday. RD not on-site over the weekend.  9/27 Patient length of stay day 18. Calorie count completed. 5 meal tickets placed in envelop. Only 4 with limited information available. Patient ate  95% of one breakfast, 25% of a lunch and dinner meal and 75% of a lunch meal. Able to provide an estimate of consumed calories . Averaged meal intake of 4 meals ~55% (~1200 kcal) which is below est nutrition needs noted below. Recommend consider placement of NGT to supplement nutrition intake and assist with preserving lean body mass.   Diet Order:   Diet Order            DIET SOFT Room service appropriate? Yes; Fluid consistency: Thin  Diet effective now                 EDUCATION NEEDS:   No education needs have been identified at this time  Skin:  Skin Assessment: Reviewed RN Assessment  Last BM:  9/25  Height:   Ht Readings from Last 1 Encounters:  09/28/19 6' (1.829 m)    Weight:   Wt Readings from Last 1 Encounters:  10/17/19 110 kg    Ideal Body Weight:   81 kg  BMI:  Body mass index is 32.89 kg/m.  Estimated Nutritional Needs:   Kcal:  2240-2508  Protein:  125-134 gr  Fluid:  >2.2 liters daily   Colman Cater MS,RD,CSG,LDN Pager: Shea Evans

## 2019-10-18 LAB — GLUCOSE, CAPILLARY
Glucose-Capillary: 148 mg/dL — ABNORMAL HIGH (ref 70–99)
Glucose-Capillary: 160 mg/dL — ABNORMAL HIGH (ref 70–99)
Glucose-Capillary: 147 mg/dL — ABNORMAL HIGH (ref 70–99)
Glucose-Capillary: 114 mg/dL — ABNORMAL HIGH (ref 70–99)

## 2019-10-18 LAB — CBC
HCT: 30.6 % — ABNORMAL LOW (ref 39.0–52.0)
Hemoglobin: 9.2 g/dL — ABNORMAL LOW (ref 13.0–17.0)
MCH: 24.9 pg — ABNORMAL LOW (ref 26.0–34.0)
MCHC: 30.1 g/dL (ref 30.0–36.0)
MCV: 82.7 fL (ref 80.0–100.0)
Platelets: 96 10*3/uL — ABNORMAL LOW (ref 150–400)
RBC: 3.7 MIL/uL — ABNORMAL LOW (ref 4.22–5.81)
RDW: 20.9 % — ABNORMAL HIGH (ref 11.5–15.5)
WBC: 8.3 10*3/uL (ref 4.0–10.5)
nRBC: 0 % (ref 0.0–0.2)

## 2019-10-18 LAB — BASIC METABOLIC PANEL
Anion gap: 7 (ref 5–15)
BUN: 20 mg/dL (ref 8–23)
CO2: 31 mmol/L (ref 22–32)
Calcium: 7.7 mg/dL — ABNORMAL LOW (ref 8.9–10.3)
Chloride: 96 mmol/L — ABNORMAL LOW (ref 98–111)
Creatinine, Ser: 0.62 mg/dL (ref 0.61–1.24)
GFR calc Af Amer: >60 mL/min (ref >60)
GFR calc non Af Amer: >60 mL/min (ref >60)
Glucose, Bld: 166 mg/dL — ABNORMAL HIGH (ref 70–99)
Potassium: 3.8 mmol/L (ref 3.5–5.1)
Sodium: 134 mmol/L — ABNORMAL LOW (ref 135–145)

## 2019-10-18 MED ORDER — DILTIAZEM HCL 30 MG PO TABS
30.0000 mg | ORAL_TABLET | Freq: Four times a day (QID) | ORAL | Status: DC
  Administered 2019-10-18 – 2019-10-19 (×4): 30 mg via ORAL
  Filled 2019-10-18 (×5): qty 1

## 2019-10-18 NOTE — Progress Notes (Signed)
PROGRESS NOTE  Stephen Wilcox:[id number] DOB: 1955/04/30 DOA: 09/28/2019 PCP: Jake Samples, PA-C  Brief History:  64 year old male without any documented significant chronic medical problems presenting to the ED on 09/24/2019 with lightheadedness malaise, and some generalized weakness.  Patient was diagnosed with positive Covid.  He had declined monoclonal antibody infusion.  On 09/28/2019, the patient presented back to the emergency department with worsening generalized weakness, dizziness, and cough and malaise.  He was noted to have oxygen saturation of 82% room air and placed on 2 L nasal cannula.  Was subsequently started on remdesivir and IV steroids.  At the time of admission, the patient was noted to have a hemoglobin of 7.3 which was a significant drop when compared to 09/24/2019 when his hemoglobin was 9.1.  Hemoccult was noted to be positive during admission.  GI was consulted to assist with management.  Dr. Laural Golden saw the patient.  He did not feel that the patient had any overt active GI bleeding.  He recommended PPI infusion for 72 hours and subsequently transitioned to oral Protonix.  He recommended endoscopic evaluation if the patient's hemoglobin continued to drop again requiring transfusion.  The patient was transfused 2 units PRBC.  His hemoglobin went up to 9.3.  The patient's oxygen demand gradually increased to 6 L, but subsequently remained stable.  Blood cultures grew Staph epidermidis in 1 of 2 sets.  This was thought to be a contaminant.  Empiric antibiotics were discontinued.  On 09/29/2019, the patient was noted to have atrial fibrillation.  EKG confirmed atrial fibrillation.  The patient was started on oral diltiazem.  Echocardiogram on 09/30/2019 showed EF 55 to 60%, no WMA.  Assessment/Plan: Severe Sepsis with Septic Shock Secondary to E. coli infection with Acute Hypotension/Elevated Lactic Acid/Leukocytosis -In retrospect severe sepsis with septic shock was  most likely NOT present on admission -Afebrile -elevated WBC's, lactic acid and episode of hypoglycemia. -Hemodynamic instability resolved, patient weaned off IV phenylephrine / pressure support on 10/14/2019 -Urine culture from 10/12/2019 with E. coli that is resistant to penicillins, okay to de-escalate from Vanco and cefepime to Rocephin -Flagyl discontinued -Blood cultures from 10/13/2019 NGTD - negative MRSA PCR -on 10/13/19 Clinically patient meets criteria for severe E. coli sepsis with hemodynamic instability as above -Lactic acid peaked at 3.6 PCT 125.85 WBC  52.6 >> 32.1 >>17.0>> 10.3>>8.3 -Sepsis pathophysiology resolved, current tachycardia is most likely secondary to atrial flutter rather than sepsis  Acute metabolic encephalopathy---resolved -In the setting of acute urinary retention and E Coli UTI/Sepsis -Patient mentation back to baseline and currently oriented x3.  Acute respiratory failure with hypoxia secondary to COVID-19 pneumonia -currently 3 to 4 L  Oxygen  -He has completed his course of steroids and remdesivir -Continue vitamin C and zinc -CRP 19.4>> 15.8>> 7.9>> 3.6>> 1.7>> 1.0>>0.8 -Ferritin 59>> 70>> 95>> 90>> 67>> 53>>50 -D-dimer 1.89>> 1.48>> 1.26>> 1.13>> 1.31>> 1.48>>1.71 -10/03/19 CTA chest--no PE; 4.8 cm lesion in the right lobe liver, incompletely characterized. 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 Delton Coombes who will follow him up as an  outpatient -Requested that CT abdomen and pelvis with contrast be performed for to complete staging process -Also requested possible port placement by general surgery for chemotherapy -Regarding nutrition, will follow feeding supplements recommended by dietitian; continue calorie counting. -If patient is unable to maintain 1800 -calorie/day intake on full liquid diet, may need to consider PEG tube placement to supplement nutrition. -Port-A-Cath placement on hold due to hemodynamic instability concerns -Patient currently has left arm PICC line  Symptomatic Anemia/heme positive stool -Appreciate GI consult -Dr. Laural Golden followed the patient--> -Transfused 4 units PRBC during this hospitalization; last 2 units ordered on 10/13/2019 in the setting of acute drop in his hemoglobin count; part of these secondary to hemodilution after aggressive fluid resuscitation).. -in retrospect, gastric mass may have been source of bleed -Continue to follow hemoglobin trend and further transfuse as needed for hemoglobin less than 7. -Continue p.o. Protonix  COVID-19 Labs  Lab Results  Component Value Date   SARSCOV2NAA POSITIVE (A) 09/24/2019    New onset atrial fibrillation---With persistent RVR -09/30/2019 echo EF 55 to 60%, no WMA -CHA2DS2-VASc = 1 -TSH--0.733 -Will continue holding on anticoagulation/aspirin in light of high risk for bleeding from gastric lesion. -BP to soft to add metoprolol so added digoxin on 10/14/2019 -Added oral Cardizem on 10/18/2019, will attempt to wean off IV Cardizem  Diabetes mellitus type 2 -09/28/2019 hemoglobin A1c 6.9 Use Novolog/Humalog Sliding scale insulin with Accu-Cheks/Fingersticks as ordered -Holding a nightly sliding scale insulin at this time.  Microcytic anemia/Iron deficiency -iron saturation 3% -ferritin low despite COVID-19 -Case discussed with Dr. Delton Coombes who has recommended initiation of Niferex -Once medically stable IV iron infusion also  recommended. -Continue transfusion as needed.  Class II obesity -BMI 32.14---this increases overall morbidity and mortality -Lifestyle modification, portion control, low calorie diet recommended after resolution of acute illness  Acute kidney injury -Renal ultrasound without hydronephrosis or obstructive uropathy -Foley catheter placed -Urine culture with E. coli as above #1 -Renal function improved/normalized with IV fluids and treatment of E. coli UTI --Renally adjust medications, avoid nephrotoxic agents / dehydration  / hypotension -Creatinine has normalized (peaked at 2.0), baseline usually around 0.7  BPH/urinary retention -Continue Flomax -Foley removed on 10/16/2019, patient failed voiding trial and Foley was reinserted on 10/16/2019 -Renal ultrasound without obstructive uropathy or hydronephrosis  Bacteremia -Blood cultures from 09/28/2019 Staph epidermidis 1 out of 2 sets -Represents contaminant -Repeat blood cultures from 10/13/2019 NGTD  Generalized Weakness and Deconditioning -PT evaluation--> skilled nursing facility with which the patient and daughter agree.  Anorexia/Adjustment Disorder--- c/n Remeron 15 mg qhs  Status is: Inpatient  Remains inpatient appropriate because: Unable to wean off IV Cardizem drip, continues to require IV antibiotics for E. coli sepsis   disposition: The patient is from: Home  Anticipated d/c is to: SNF  Anticipated d/c date is: to be determined  Patient currently is not medically stable to d/c.  Unable to wean off IV Cardizem drip, continues to require IV antibiotics for E. coli sepsis   Family Communication:   Discussed with Daughter [name] at [phone]  Consultants:  GI--Rehman  Code Status:  FULL   DVT Prophylaxis:  SCDs (Gi Bleed)  Procedures: As Listed in Progress Note Above  Antibiotics: None  Subjective: - 10/18/19 No fevers no vomiting or  diarrhea -Appetite is not great, resting -Denies chest pain or abdominal pain  Objective: Vitals:   10/18/19 1230 10/18/19 1300 10/18/19 1330 10/18/19 1400  BP: Marland Kitchen)  144/101 (!) 150/86 (!) 129/52 136/84  Pulse: 100 93 (!) 103 100  Resp: (!) 22 19 19 17   Temp:      TempSrc:      SpO2: 98% 97% 97% 98%  Weight:      Height:        Intake/Output Summary (Last 24 hours) at 10/18/2019 1416 Last data filed at 10/18/2019 0000 Gross per 24 hour  Intake 3397.93 ml  Output 300 ml  Net 3097.93 ml   Weight change: 2.4 kg  Exam: General exam: Alert, awake, oriented x 3; obese, no conversational dyspnea Nose- [place] 3.5 L/min Respiratory system: No wheezing, few scattered rhonchi, fair air movement Cardiovascular system: Irregular irregular, tachy gastrointestinal system: Abdomen is nondistended, soft and without guarding. . Normal bowel sounds heard. Neuro=: Generalized weakness,No focal neurological deficits. Extremities: No cyanosis or clubbing, left arm PICC line in situ Skin: No rashes, no petechiae. Psychiatry: Judgement and insight appear normal.  Flat affect ,   No suicidal ideation or hallucination. GU-Foley catheter in situ   Data Reviewed: I have personally reviewed following labs and imaging studies  Basic Metabolic Panel: Recent Labs  Lab 10/13/19 0911 10/13/19 1209 10/15/19 0435 10/17/19 0847 10/18/19 0525  NA 132* 133* 137 135 134*  K 4.8 4.5 3.6 3.7 3.8  CL 101 101 100 97* 96*  CO2 22 21* 28 31 31   GLUCOSE 203* 189* 164* 178* 166*  BUN 27* 26* 22 19 20   CREATININE 2.00* 1.77* 0.86 0.58* 0.62  CALCIUM 6.8* 7.0* 7.6* 7.5* 7.7*  MG  --   --  1.8  --   --    Liver Function Tests: Recent Labs  Lab 10/12/19 0611 10/13/19 0911  AST 30 507*  ALT 39 313*  ALKPHOS 69 99  BILITOT 0.8 1.2  PROT 4.6* 4.5*  ALBUMIN 2.1* 2.2*   CBC: Recent Labs  Lab 10/13/19 0911 10/14/19 0848 10/15/19 0435 10/17/19 0847 10/18/19 0525  WBC 52.6* 32.1* 17.0* 10.3 8.3   NEUTROABS 47.9*  --   --  9.5*  --   HGB 6.4* 8.1* 8.3* 8.6* 9.2*  HCT 21.6* 25.5* 26.5* 29.0* 30.6*  MCV 80.0 80.4 81.5 83.1 82.7  PLT 142* 97* 86* 76* 96*   CBG: Recent Labs  Lab 10/17/19 1116 10/17/19 1739 10/17/19 2126 10/18/19 0813 10/18/19 1155  GLUCAP 167* 148* 136* 148* 160*   Urine analysis:    Component Value Date/Time   COLORURINE YELLOW 10/12/2019 1544   APPEARANCEUR HAZY (A) 10/12/2019 1544   LABSPEC 1.014 10/12/2019 1544   PHURINE 5.0 10/12/2019 1544   GLUCOSEU NEGATIVE 10/12/2019 1544   HGBUR MODERATE (A) 10/12/2019 1544   BILIRUBINUR NEGATIVE 10/12/2019 1544   KETONESUR NEGATIVE 10/12/2019 1544   PROTEINUR NEGATIVE 10/12/2019 1544   NITRITE NEGATIVE 10/12/2019 1544   LEUKOCYTESUR NEGATIVE 10/12/2019 1544   Sepsis Labs: Recent Results (from the past 240 hour(s))  Urine Culture     Status: Abnormal   Collection Time: 10/12/19  3:44 PM   Specimen: Urine, Random  Result Value Ref Range Status   Specimen Description   Final    URINE, RANDOM Performed at [facility], [address] [place], [place] [address]    Special Requests   Final    NONE Performed at [facility], [address], [place], [place] [address]    Culture >=100,000 COLONIES/mL ESCHERICHIA COLI (A)  Final   Report Status 10/14/2019 FINAL  Final   Organism ID, Bacteria ESCHERICHIA COLI (A)  Final  Susceptibility   Escherichia coli - MIC*    AMPICILLIN >=32 RESISTANT Resistant     CEFAZOLIN <=4 SENSITIVE Sensitive     CEFTRIAXONE <=0.25 SENSITIVE Sensitive     CIPROFLOXACIN <=0.25 SENSITIVE Sensitive     GENTAMICIN <=1 SENSITIVE Sensitive     IMIPENEM <=0.25 SENSITIVE Sensitive     NITROFURANTOIN <=16 SENSITIVE Sensitive     TRIMETH/SULFA <=20 SENSITIVE Sensitive     AMPICILLIN/SULBACTAM >=32 RESISTANT Resistant     PIP/TAZO <=4 SENSITIVE Sensitive     * >=100,000 COLONIES/mL ESCHERICHIA COLI  MRSA PCR Screening     Status: None   Collection Time: 10/13/19  4:08 AM    Specimen: Nasopharyngeal  Result Value Ref Range Status   MRSA by PCR NEGATIVE NEGATIVE Final    Comment:        The GeneXpert MRSA Assay (FDA approved for NASAL specimens only), is one component of a comprehensive MRSA colonization surveillance program. It is not intended to diagnose MRSA infection nor to guide or monitor treatment for MRSA infections. Performed at [facility], [address], [place], [place] [address]   Culture, blood (Routine X 2) w Reflex to ID Panel     Status: None (Preliminary result)   Collection Time: 10/13/19  6:36 PM   Specimen: BLOOD LEFT HAND  Result Value Ref Range Status   Specimen Description BLOOD LEFT HAND  Final   Special Requests   Final    BOTTLES DRAWN AEROBIC ONLY Blood Culture adequate volume   Culture   Final    NO GROWTH 4 DAYS Performed at [facility], [address], [place], [place] [address]    Report Status PENDING  Incomplete  Culture, blood (Routine X 2) w Reflex to ID Panel     Status: None (Preliminary result)   Collection Time: 10/13/19  6:36 PM   Specimen: BLOOD LEFT HAND  Result Value Ref Range Status   Specimen Description BLOOD LEFT HAND  Final   Special Requests   Final    BOTTLES DRAWN AEROBIC AND ANAEROBIC Blood Culture adequate volume   Culture   Final    NO GROWTH 4 DAYS Performed at [facility], [address], [place], [place] [address]    Report Status PENDING  Incomplete     Scheduled Meds: . (feeding supplement) PROSource Plus  30 mL Oral TID BM  . sodium chloride   Intravenous Once  . vitamin C  500 mg Oral Daily  . busPIRone  5 mg Oral TID  . Chlorhexidine Gluconate Cloth  6 each Topical Daily  . digoxin  0.125 mg Oral Daily  . diltiazem  30 mg Oral Q6H  . hyoscyamine  0.25 mg Sublingual TID AC  . insulin aspart  0-20 Units Subcutaneous TID WC  . iron polysaccharides  150 mg Oral Daily  . mirtazapine  15 mg Oral QHS  . multivitamin-lutein  1 capsule Oral Daily  . pantoprazole  40 mg Oral BID AC   . sodium chloride flush  10-40 mL Intracatheter Q12H  . tamsulosin  0.4 mg Oral QPC supper  . zinc sulfate  220 mg Oral Daily   Continuous Infusions: . sodium chloride    . cefTRIAXone (ROCEPHIN)  IV Stopped (10/17/19 2309)  . diltiazem (CARDIZEM) infusion 5 mg/hr (10/18/19 0950)  . lactated ringers 20 mL/hr at 10/18/19 0000    Procedures/Studies: CT ANGIO CHEST PE W OR WO CONTRAST  Result Date: 10/03/2019 CLINICAL DATA:  COVID-19 positive, weakness, dizziness and shortness  of breath EXAM: CT ANGIOGRAPHY CHEST WITH CONTRAST TECHNIQUE: Multidetector CT imaging of the chest was performed using the standard protocol during bolus administration of intravenous contrast. Multiplanar CT image reconstructions and MIPs were obtained to evaluate the vascular anatomy. CONTRAST:  118mL OMNIPAQUE IOHEXOL 350 MG/ML SOLN COMPARISON:  Radiograph 09/28/2019, CT abdomen pelvis 08/15/2018 FINDINGS: Cardiovascular: Satisfactory opacification the pulmonary arteries to the segmental level. No pulmonary artery filling defects are identified. Central pulmonary arteries are normal caliber. Suboptimal opacification of the thoracic aorta. No discernible acute abnormality of the thoracic aorta proximal great vessels. Aberrant right subclavian artery. Borderline cardiac enlargement. Slight reflux of contrast into the IVC. Mediastinum/Nodes: No mediastinal fluid or gas. Normal thyroid gland and thoracic inlet. No acute abnormality of the trachea or esophagus. No worrisome mediastinal, hilar or axillary adenopathy. Lungs/Pleura: Mixed areas of heterogeneous consolidative and ground-glass opacity throughout both lungs with a basilar and peripheral predominance. No pneumothorax or visible effusion. Diffuse airways thickening. Upper Abdomen: Indeterminate intermediate attenuation (38 HU) lesion measuring up to 8.5 cm in maximum transaxial dimension within the right lobe liver (4/80). Cholelithiasis with a partially calcified  gallstone towards the gallbladder neck. Multiple soft tissue attenuation lesions present along the lesser curvature/gastrohepatic ligament. Largest measuring up to 4.6 cm in size (4/91). Partially exophytic 0.7 cm lesion arising from the upper pole left kidney, corresponding with a previously seen fluid attenuation cyst on prior comparison CT, could reflect intracystic hemorrhage/proteinaceous debris. Musculoskeletal: Multilevel degenerative changes are present in the imaged portions of the spine. No acute osseous abnormality or suspicious osseous lesion. Probable intramuscular lipoma of the right infraspinatus measuring up to 6.9 by 3.5 cm (4/20). No other acute or worrisome chest wall lesions. Review of the MIP images confirms the above findings. IMPRESSION: 1. No evidence of acute pulmonary artery filling defects. 2. Mixed areas of heterogeneous consolidative and ground-glass opacity throughout both lungs with a basilar and peripheral predominance. Findings are compatible with multifocal pneumonia compatible with a COVID-19 etiology. 3. Indeterminate 4.8 cm lesion in the right lobe liver, incompletely characterized. Additional multiple soft tissue attenuation lesions along the lesser curvature/gastrohepatic ligament, largest measuring up to 4.6 cm in size. These findings are new from comparison abdominal CT 08/15/2018 and could raise concern for potential malignancy. Could consider further evaluation with abdominal MR with contrast. 4. Additional 7 mm hyperdense lesion arising from the upper pole left kidney, could reflect proteinaceous cysts, Ob also be better evaluated on dedicated abdominal imaging. 5. Cholelithiasis. 6. Aberrant right subclavian artery. These results will be called to the ordering clinician or representative by the Radiologist Assistant, and communication documented in the PACS or [employer]. Electronically Signed   By: Lovena Le M.D.   On: 10/03/2019 15:38   CT ABDOMEN PELVIS W  CONTRAST  Result Date: 10/11/2019 CLINICAL DATA:  COVID positive. EXAM: CT ABDOMEN AND PELVIS WITH CONTRAST TECHNIQUE: Multidetector CT imaging of the abdomen and pelvis was performed using the standard protocol following bolus administration of intravenous contrast. CONTRAST:  69mL OMNIPAQUE IOHEXOL 300 MG/ML  SOLN COMPARISON:  August 15, 2018 FINDINGS: Lower chest: Marked severity bilateral lower lobe infiltrates are seen. Hepatobiliary: Multiple large heterogeneous low-attenuation liver masses are seen. The largest measures approximately 6.0 cm x 5.3 cm x 6.1 cm. A 1.7 cm gallstone is seen within the neck of an otherwise normal-appearing gallbladder. Pancreas: Unremarkable. No pancreatic ductal dilatation or surrounding inflammatory changes. Spleen: Normal in size without focal abnormality. Adrenals/Urinary Tract: Adrenal glands are unremarkable. Kidneys are normal in size, without  renal calculi or hydronephrosis. A stable 1.2 cm diameter area of heterogeneous low attenuation is seen within the medial aspect of the mid to lower right kidney. A stable 1.2 cm cyst is seen along the posterior aspect of the upper pole of the left kidney. Bladder is unremarkable. Stomach/Bowel: A 3.0 cm x 8.7 cm x 6.3 cm heterogeneous soft tissue mass is seen extending from the wall of the lesser sac of the stomach into the gastric lumen (axial CT images 15 through 25, CT series number 2). Appendix appears normal. There is no evidence of bowel dilatation. Numerous diverticula are seen throughout the descending and sigmoid colon. Moderate severity thickening of the proximal sigmoid colon is noted (best seen on coronal reformatted images 43 through 50, CT series number 5). Vascular/Lymphatic: No significant vascular findings are present. Reproductive: The prostate gland is markedly enlarged. Other: Multiple round heterogeneous soft tissue masses of various sizes are seen within the upper abdomen, adjacent to the lesser sac of the  stomach. The largest measures approximately 5.2 cm x 5.6 cm x 4.6 cm. Musculoskeletal: Multilevel degenerative changes are seen throughout the lumbar spine. IMPRESSION: 1. Marked severity bilateral lower lobe infiltrates. 2. Multiple large heterogeneous low-attenuation liver masses, consistent with metastatic disease. 3. Large gastric mass, suspicious for primary neoplasm. 4. Multiple soft tissue masses within the mesentery of the upper abdomen, adjacent to the lesser sac of the stomach, consistent with metastatic disease. 5. Colonic diverticulosis. 6. Thickening of the proximal sigmoid colon which may represent sequelae associated with mild colitis/diverticulitis correlation with follow-up abdomen pelvis CT is recommended to exclude the presence of an underlying neoplasm. 7. Stable area of low attenuation within the right kidney. While this may represent a hemorrhagic cyst, correlation with renal ultrasound is recommended. 8. Cholelithiasis. Electronically Signed   By: Virgina Norfolk M.D.   On: 10/11/2019 23:24   MR LIVER W WO CONTRAST  Result Date: 10/04/2019 CLINICAL DATA:  Evaluate liver lesions and upper abdominal adenopathy seen on recent chest CT. EXAM: MRI ABDOMEN WITHOUT AND WITH CONTRAST TECHNIQUE: Multiplanar multisequence MR imaging of the abdomen was performed both before and after the administration of intravenous contrast. CONTRAST:  86mL GADAVIST GADOBUTROL 1 MMOL/ML IV SOLN COMPARISON:  CT abdomen/pelvis 08/15/2018 FINDINGS: Lower chest: The lungs demonstrate changes of COVID pneumonia as demonstrated on today's chest CT. Hepatobiliary: Numerous diffusion positive hepatic metastatic lesions are noted. Segment 4A lesion measures 3.7 cm on image 7/8. 5 cm lesion and segment 7 on image 12/8. 5 cm lesion in segment 6 on image 18/8. Several other smaller lesions are noted. No intrahepatic biliary dilatation. There is diffuse fatty infiltration of the liver noted. A 2 cm gallstone is noted in the  gallbladder. No common bile duct dilatation. Pancreas:  No mass, inflammation or ductal dilatation. Spleen:  Normal size. No focal lesions. Adrenals/Urinary Tract: The adrenal glands and kidneys are unremarkable. Stomach/Bowel: There is a large infiltrating gastric mass involving the fundal region 6.5 x 4.8 cm and is diffusion positive. Moderate contrast enhancement is noted. Adjacent gastrohepatic ligament lymphadenopathy with the largest node measuring 4.6 cm. Vascular/Lymphatic: The aorta is normal in caliber. No dissection. The branch vessels are patent. No retroperitoneal lymphadenopathy. Other:  No ascites or abdominal wall hernia. Musculoskeletal: No worrisome bone lesions. IMPRESSION: 1. 6.5 x 4.8 cm infiltrating gastric mass involving the fundal region. Associated gastrohepatic ligament lymphadenopathy. 2. Numerous hepatic metastatic lesions. 3. Cholelithiasis. Electronically Signed   By: Marijo Sanes M.D.   On: 10/04/2019 06:26  US RENAL  Result Date: 10/13/2019 CLINICAL DATA:  Acute kidney injury, recent discovery of abdominal and liver masses. EXAM: RENAL / URINARY TRACT ULTRASOUND COMPLETE COMPARISON:  MRI 10/04/2019 and CT 10/11/2019 FINDINGS: Right Kidney: Renal measurements: 12.9 x 5.6 x 5.7 cm = volume: 213 mL. Echogenicity within normal limits. No mass or hydronephrosis visualized. Left Kidney: Renal measurements: 14 x 6.3 x 5.6 cm = volume: 258 mL. Echogenicity within normal limits. No mass or hydronephrosis visualized. Bladder: Foley catheter in the urinary bladder. Other: Incidental liver masses in the RIGHT hepatic lobe measuring 4.3 x 3.4 x 3.6 cm and 6.3 x 4.4 x 5.6 cm, grossly similar accounting for differences in angle of measurement and technique as compared to recent imaging studies. One of these measurements may combine the dimensions of 2 adjacent masses, would refer to previous CT and MRI for follow-up measurements. IMPRESSION: 1. No hydronephrosis. 2. Hepatic metastatic disease  better visualized on recent CTs. Electronically Signed   By: Zetta Bills M.D.   On: 10/13/2019 14:20   DG CHEST PORT 1 VIEW  Result Date: 10/13/2019 CLINICAL DATA:  History of COVID-19 positivity, status post left PICC line placement EXAM: PORTABLE CHEST 1 VIEW COMPARISON:  09/28/2019 FINDINGS: Cardiac shadow is stable. Patchy airspace opacities are noted in both lungs increased from the prior exam particularly in the left retrocardiac region consistent with progressive COVID-19 pneumonia. Left-sided PICC line is seen with the catheter tip at the cavoatrial junction. No bony abnormality is noted. IMPRESSION: PICC line in satisfactory position. Increase in the degree of bilateral patchy airspace opacity consistent with COVID-19 positivity. Electronically Signed   By: Inez Catalina M.D.   On: 10/13/2019 20:06   DG Chest Port 1 View  Result Date: 09/28/2019 CLINICAL DATA:  COVID EXAM: PORTABLE CHEST 1 VIEW COMPARISON:  09/24/2019 FINDINGS: Development of patchy bilateral airspace opacities. No pleural effusion. Stable cardiomediastinal silhouette. No pneumothorax. IMPRESSION: Development of patchy bilateral airspace opacities, consistent with bilateral pneumonia. Electronically Signed   By: Donavan Foil M.D.   On: 09/28/2019 23:14   DG Chest Port 1 View  Result Date: 09/24/2019 CLINICAL DATA:  COVID pneumonia, weakness, fatigue EXAM: PORTABLE CHEST 1 VIEW COMPARISON:  09/15/2019 FINDINGS: The lungs are symmetrically expanded. Minimal left basilar atelectasis is again noted. No superimposed confluent pulmonary infiltrate. No pneumothorax or pleural effusion. Cardiac size within normal limits. The pulmonary vascularity is normal. No acute bone abnormality. IMPRESSION: Minimal left basilar atelectasis, unchanged. No superimposed confluent pulmonary infiltrate. Electronically Signed   By: Fidela Salisbury MD   On: 09/24/2019 20:15   ECHOCARDIOGRAM COMPLETE  Result Date: 09/30/2019    ECHOCARDIOGRAM REPORT    Patient Name:   CAVION FAIOLA Date of Exam: 09/30/2019 Medical Rec #:  [id number]         Height:       72.0 in Accession #:    [id number]        Weight:       237.0 lb Date of Birth:  [birth year]        BSA:          2.290 m Patient Age:    60 years          BP:           95/60 mmHg Patient Gender: M                 HR:           126 bpm. Exam [facility] Procedure: 2D Echo Indications:    Endocarditis I38  History:        Patient has no prior history of Echocardiogram examinations.                 Risk Factors:Non-Smoker. Pneumonia due to COVID-19 virus,                 Sepsis, Lactic Acidosis.  Sonographer:    Leavy Cella RDCS (AE) Referring Phys: [id number] OLADAPO ADEFESO IMPRESSIONS  1. Left ventricular ejection fraction, by estimation, is 55 to 60%. The left ventricle has normal function. The left ventricle has no regional wall motion abnormalities. There is moderate left ventricular hypertrophy. Left ventricular diastolic parameters are indeterminate.  2. Right ventricular systolic function is normal. The right ventricular size is normal. Tricuspid regurgitation signal is inadequate for assessing PA pressure.  3. The mitral valve is grossly normal. No evidence of mitral valve regurgitation.  4. The aortic valve is tricuspid. Aortic valve regurgitation is not visualized.  5. The inferior vena cava is normal in size with <50% respiratory variability, suggesting right atrial pressure of 8 mmHg.  6. Views are somewhat limited, but no definite valvular vegetations are visualized. FINDINGS  Left Ventricle: Left ventricular ejection fraction, by estimation, is 55 to 60%. The left ventricle has normal function. The left ventricle has no regional wall motion abnormalities. The left ventricular internal cavity size was normal in size. There is  moderate left ventricular hypertrophy. Left ventricular diastolic parameters are indeterminate. Right Ventricle: The right ventricular size is normal. No increase  in right ventricular wall thickness. Right ventricular systolic function is normal. Tricuspid regurgitation signal is inadequate for assessing PA pressure. Left Atrium: Left atrial size was normal in size. Right Atrium: Right atrial size was normal in size. Pericardium: There is no evidence of pericardial effusion. Mitral Valve: The mitral valve is grossly normal. No evidence of mitral valve regurgitation. Tricuspid Valve: The tricuspid valve is grossly normal. Tricuspid valve regurgitation is trivial. Aortic Valve: The aortic valve is tricuspid. Aortic valve regurgitation is not visualized. Pulmonic Valve: The pulmonic valve was grossly normal. Pulmonic valve regurgitation is trivial. Aorta: The aortic root is normal in size and structure. Venous: The inferior vena cava is normal in size with less than 50% respiratory variability, suggesting right atrial pressure of 8 mmHg. IAS/Shunts: No atrial level shunt detected by color flow Doppler.  LEFT VENTRICLE PLAX 2D LVIDd:         4.37 cm Diastology LVIDs:         2.20 cm LV e' medial:    7.18 cm/s LV PW:         1.72 cm LV E/e' medial:  9.2 LV IVS:        1.46 cm LV e' lateral:   12.90 cm/s                        LV E/e' lateral: 5.1  RIGHT VENTRICLE RV S prime:     16.00 cm/s TAPSE (M-mode): 1.5 cm LEFT ATRIUM             Index       RIGHT ATRIUM           Index LA diam:        3.70 cm 1.62 cm/m  RA Area:     15.40 cm LA Vol (A2C):   63.9 ml 27.90 ml/m RA Volume:   39.80 ml  17.38 ml/m LA  Vol (A4C):   40.6 ml 17.73 ml/m LA Biplane Vol: 52.4 ml 22.88 ml/m   AORTA Ao Root diam: 3.50 cm MITRAL VALVE MV Area (PHT): 6.60 cm MV Decel Time: 115 msec MV E velocity: 66.00 cm/s MV A velocity: 39.80 cm/s MV E/A ratio:  1.66 Rozann Lesches MD Electronically signed by Rozann Lesches MD Signature Date/Time: 09/30/2019/4:58:16 PM    Final    [place] EKG SITE RITE  Result Date: 10/13/2019 If Site Rite image not attached, placement could not be confirmed due to current cardiac  rhythm.   Roxan Hockey, MD  [facility]  If 7PM-7AM, please contact night-coverage [email or web address]  10/18/2019, 2:16 PM   LOS: 20 days

## 2019-10-19 DIAGNOSIS — C787 Secondary malignant neoplasm of liver and intrahepatic bile duct: Secondary | ICD-10-CM

## 2019-10-19 DIAGNOSIS — Z515 Encounter for palliative care: Secondary | ICD-10-CM

## 2019-10-19 DIAGNOSIS — Z7189 Other specified counseling: Secondary | ICD-10-CM

## 2019-10-19 LAB — CULTURE, BLOOD (ROUTINE X 2)
Culture: NO GROWTH
Culture: NO GROWTH
Special Requests: ADEQUATE
Special Requests: ADEQUATE

## 2019-10-19 LAB — DIGOXIN LEVEL: Digoxin Level: 0.5 ng/mL — ABNORMAL LOW (ref 1.0–2.0)

## 2019-10-19 LAB — GLUCOSE, CAPILLARY
Glucose-Capillary: 178 mg/dL — ABNORMAL HIGH (ref 70–99)
Glucose-Capillary: 216 mg/dL — ABNORMAL HIGH (ref 70–99)
Glucose-Capillary: 221 mg/dL — ABNORMAL HIGH (ref 70–99)
Glucose-Capillary: 127 mg/dL — ABNORMAL HIGH (ref 70–99)

## 2019-10-19 MED ORDER — DILTIAZEM HCL 60 MG PO TABS
60.0000 mg | ORAL_TABLET | Freq: Four times a day (QID) | ORAL | Status: AC
  Administered 2019-10-19 – 2019-10-21 (×10): 60 mg via ORAL
  Filled 2019-10-19 (×11): qty 1

## 2019-10-19 MED ORDER — DIGOXIN 125 MCG PO TABS
0.1250 mg | ORAL_TABLET | Freq: Once | ORAL | Status: AC
  Administered 2019-10-19: 0.125 mg via ORAL
  Filled 2019-10-19: qty 1

## 2019-10-19 MED ORDER — INFLUENZA VAC SPLIT QUAD 0.5 ML IM SUSY
0.5000 mL | PREFILLED_SYRINGE | INTRAMUSCULAR | Status: DC
  Filled 2019-10-19: qty 0.5

## 2019-10-19 MED ORDER — DIGOXIN 125 MCG PO TABS
0.2500 mg | ORAL_TABLET | Freq: Every day | ORAL | Status: DC
  Administered 2019-10-20 – 2019-10-21 (×2): 0.25 mg via ORAL
  Filled 2019-10-19 (×2): qty 2

## 2019-10-19 NOTE — Progress Notes (Signed)
PROGRESS NOTE  Stephen Wilcox XYI:[id number] DOB: 1956/01/01 DOA: 09/28/2019 PCP: Jake Samples, PA-C  Brief History:  64 year old male without any documented significant chronic medical problems presenting to the ED on 09/24/2019 with lightheadedness malaise, and some generalized weakness.  Patient was diagnosed with positive Covid.  He had declined monoclonal antibody infusion.  On 09/28/2019, the patient presented back to the emergency department with worsening generalized weakness, dizziness, and cough and malaise.  He was noted to have oxygen saturation of 82% room air and placed on 2 L nasal cannula.  Was subsequently started on remdesivir and IV steroids.  At the time of admission, the patient was noted to have a hemoglobin of 7.3 which was a significant drop when compared to 09/24/2019 when his hemoglobin was 9.1.  Hemoccult was noted to be positive during admission.  GI was consulted to assist with management.  Dr. Laural Golden saw the patient.  He did not feel that the patient had any overt active GI bleeding.  He recommended PPI infusion for 72 hours and subsequently transitioned to oral Protonix.  He recommended endoscopic evaluation if the patient's hemoglobin continued to drop again requiring transfusion.  The patient was transfused 2 units PRBC.  His hemoglobin went up to 9.3.  The patient's oxygen demand gradually increased to 6 L, but subsequently remained stable.  Blood cultures grew Staph epidermidis in 1 of 2 sets.  This was thought to be a contaminant.  Empiric antibiotics were discontinued.  On 09/29/2019, the patient was noted to have atrial fibrillation.  EKG confirmed atrial fibrillation.  The patient was started on oral diltiazem.  Echocardiogram on 09/30/2019 showed EF 55 to 60%, no WMA.  Assessment/Plan: Severe Sepsis with Septic Shock Secondary to E. coli infection with Acute Hypotension/Elevated Lactic Acid/Leukocytosis -In retrospect severe sepsis with septic shock was  most likely NOT present on admission -However -on 10/13/19 Clinically patient met criteria for severe E. coli sepsis with hemodynamic instability as above -elevated WBC's, lactic acid and episode of hypoglycemia. -Hemodynamic instability resolved, patient weaned off IV phenylephrine pressure support on 10/14/2019 -Urine culture from 10/12/2019 with E. coli that is resistant to penicillins, okay to de-escalate from Vanco and cefepime to Rocephin (completed iv Rocephin) -Flagyl discontinued -Blood cultures from 10/13/2019 NGTD - negative MRSA PCR -Lactic acid peaked at 3.6 PCT 125.85 WBC  52.6 >> 32.1 >>17.0>> 10.3>>8.3 -Sepsis pathophysiology has resolved, current tachycardia is most likely secondary to atrial flutter rather than sepsis  Acute metabolic encephalopathy---resolved -In the setting of acute urinary retention and E Coli UTI/Sepsis -Patient mentation back to baseline and currently oriented x3.  Acute respiratory failure with hypoxia secondary to COVID-19 pneumonia -currently 3 to 4 L  Oxygen  -He has completed his course of steroids and remdesivir -Continue vitamin C and zinc -CRP 19.4>> 15.8>> 7.9>> 3.6>> 1.7>> 1.0>>0.8 -Ferritin 59>> 70>> 95>> 90>> 67>> 53>>50 -D-dimer 1.89>> 1.48>> 1.26>> 1.13>> 1.31>> 1.48>>1.71 -10/03/19 CTA chest--no PE; 4.8 cm lesion in the right lobe liver, incompletely characterized. 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  Delton Coombes who will follow him up as an outpatient - CT abdomen and pelvis with contrast performed  to complete staging process -Current nutritional and functional status precludes chemotherapy   Anorexia/FTT/adjustment disorder with depressed mood--  patient's appetite remains very very poor -Continue Remeron for appetite stimulation and sleep and mood -Dietitian recommendations appreciated; continue calorie counting. -If patient is unable to maintain 1800 -calorie/day intake on full liquid diet, -Not a candidate forr PEG tube placement due to adenocarcinoma of the stomach -Patient currently has left arm PICC line  Symptomatic Anemia/heme positive stool -Appreciate GI consult -Dr. Laural Golden followed the patient--> -Transfused 4 units PRBC during this hospitalization; last 2 units ordered on 10/13/2019 in the setting of acute drop in his hemoglobin count; part of these secondary to hemodilution after aggressive fluid resuscitation).. -in retrospect, gastric adenocarcinoma may have been source of bleed -Continue to follow hemoglobin trend and further transfuse as needed for hemoglobin less than 7. -Continue p.o. Protonix  COVID-19 Labs  Lab Results  Component Value Date   SARSCOV2NAA POSITIVE (A) 09/24/2019    New onset atrial fibrillation---With persistent RVR -09/30/2019 echo EF 55 to 60%, no WMA -CHA2DS2-VASc = 1 -TSH--0.733 -Will continue holding on anticoagulation/aspirin in light of high risk for bleeding from gastric lesion. -BP to soft to add metoprolol so added digoxin on 10/14/2019 -Added oral Cardizem on 10/18/2019, -- we are continuing to attempt to wean off IV Cardizem  Diabetes mellitus type 2 -09/28/2019 hemoglobin A1c 6.9 Use Novolog/Humalog Sliding scale insulin with Accu-Cheks/Fingersticks as ordered -Holding a nightly sliding scale insulin at this time.  Microcytic anemia/Iron deficiency -iron saturation 3% -ferritin low despite COVID-19 -Case discussed with  Dr. Delton Coombes who has recommended initiation of Niferex -Once medically stable IV iron infusion also recommended. -Continue transfusion as needed.  Class II obesity -BMI 32.14---this increases overall morbidity and mortality -Lifestyle modification, portion control, low calorie diet recommended after resolution of acute illness  Acute kidney injury -Renal ultrasound without hydronephrosis or obstructive uropathy -Foley catheter placed -Urine culture with E. coli as above #1 -Renal function improved/normalized with IV fluids and treatment of E. coli UTI --Renally adjust medications, avoid nephrotoxic agents / dehydration  / hypotension -Creatinine has normalized (peaked at 2.0), baseline usually around 0.7  BPH/urinary retention -Continue Flomax -Foley removed on 10/16/2019, patient failed voiding trial and Foley was reinserted on 10/16/2019 -Renal ultrasound without obstructive uropathy or hydronephrosis  Bacteremia -Blood cultures from 09/28/2019 Staph epidermidis 1 out of 2 sets -Represents contaminant -Repeat blood cultures from 10/13/2019 NGTD  Generalized Weakness and Deconditioning -PT evaluation--> skilled nursing facility with which the patient and daughter agree. -Poor nutritional status, Covid infection, underlying gastric malignancy or contributing to patient's weakness and deconditioning  Social/Ethics---  -on 10/19/19 -Extensive conversations with patient, palliative care provider and patient's daughter who lives in [place]--- current plan of care, underlying diagnosis, prognosis and goals of care discussed -Patient's RN Janett Billow present at bedside --he wishes to be a DNR/DNI -Patient will speak further with his daughter who lives in [place] and son who lives in [place]--- prior to making a decision on possible de-escalation of care and transition to comfort care/hospice  Status is: Inpatient  Remains inpatient appropriate because: Unable to wean off  IV Cardizem drip, continues to require IV antibiotics for E. coli sepsis   disposition: The patient is from: Home  Anticipated d/c is to: SNF  Anticipated d/c date is: to be determined  Patient currently is not medically  stable to d/c.  Unable to wean off IV Cardizem drip, continues to require IV antibiotics for E. coli sepsis   Family Communication:   Discussed with Daughter [name] at [phone]  Consultants:  GI--Rehman  Code Status:  DNR  DVT Prophylaxis:  SCDs (Gi Bleed)  Procedures: As Listed in Progress Note Above  Antibiotics: None  Subjective: - 10/19/19 No fevers no vomiting or diarrhea -Appetite is poor -Denies chest pain or abdominal pain -Able to get from bed to chair with assistance  -on 10/19/19 -Extensive conversations with patient, palliative care provider and patient's daughter who lives in [place]--- current plan of care, underlying diagnosis, prognosis and goals of care discussed -Patient's RN Janett Billow present at bedside --he wishes to be a DNR/DNI -Patient will speak further with his daughter who lives in [place] and son who lives in [place]--- prior to making a decision on possible de-escalation of care and transition to comfort care/hospice  Objective: Vitals:   10/19/19 1000 10/19/19 1100 10/19/19 1120 10/19/19 1200  BP: (!) 116/45 (!) 105/56  (!) 113/59  Pulse: (!) 108 (!) 121 (!) 121 (!) 136  Resp: (!) 27 (!) 29 (!) 23 (!) 25  Temp:   (!) 97.3 F (36.3 C)   TempSrc:   Oral   SpO2: 90% 95% 93% 97%  Weight:      Height:        Intake/Output Summary (Last 24 hours) at 10/19/2019 1518 Last data filed at 10/18/2019 1639 Gross per 24 hour  Intake --  Output 750 ml  Net -750 ml   Weight change: 3 kg  Exam: General exam: Alert, awake, oriented x 3; obese, no conversational dyspnea Nose- [place] 3.5 L/min Respiratory system: No wheezing, few scattered rhonchi, fair air  movement Cardiovascular system: Irregular irregular, tachy gastrointestinal system: Abdomen is nondistended, soft and without guarding. . Normal bowel sounds heard. Neuro=: Generalized weakness,No focal neurological deficits. Extremities: No cyanosis or clubbing, left arm PICC line in situ Skin: No rashes, no petechiae. Psychiatry: Judgement and insight appear normal.  Flat affect ,   No suicidal ideation or hallucination. GU-Foley catheter in situ   Data Reviewed: I have personally reviewed following labs and imaging studies  Basic Metabolic Panel: Recent Labs  Lab 10/13/19 0911 10/13/19 1209 10/15/19 0435 10/17/19 0847 10/18/19 0525  NA 132* 133* 137 135 134*  K 4.8 4.5 3.6 3.7 3.8  CL 101 101 100 97* 96*  CO2 22 21* 28 31 31   GLUCOSE 203* 189* 164* 178* 166*  BUN 27* 26* 22 19 20   CREATININE 2.00* 1.77* 0.86 0.58* 0.62  CALCIUM 6.8* 7.0* 7.6* 7.5* 7.7*  MG  --   --  1.8  --   --    Liver Function Tests: Recent Labs  Lab 10/13/19 0911  AST 507*  ALT 313*  ALKPHOS 99  BILITOT 1.2  PROT 4.5*  ALBUMIN 2.2*   CBC: Recent Labs  Lab 10/13/19 0911 10/14/19 0848 10/15/19 0435 10/17/19 0847 10/18/19 0525  WBC 52.6* 32.1* 17.0* 10.3 8.3  NEUTROABS 47.9*  --   --  9.5*  --   HGB 6.4* 8.1* 8.3* 8.6* 9.2*  HCT 21.6* 25.5* 26.5* 29.0* 30.6*  MCV 80.0 80.4 81.5 83.1 82.7  PLT 142* 97* 86* 76* 96*   CBG: Recent Labs  Lab 10/18/19 1155 10/18/19 1629 10/18/19 2144 10/19/19 0740 10/19/19 1118  GLUCAP 160* 147* 114* 178* 216*   Urine analysis:    Component Value Date/Time   COLORURINE  YELLOW 10/12/2019 1544   APPEARANCEUR HAZY (A) 10/12/2019 1544   LABSPEC 1.014 10/12/2019 1544   PHURINE 5.0 10/12/2019 1544   GLUCOSEU NEGATIVE 10/12/2019 1544   HGBUR MODERATE (A) 10/12/2019 1544   BILIRUBINUR NEGATIVE 10/12/2019 1544   KETONESUR NEGATIVE 10/12/2019 1544   PROTEINUR NEGATIVE 10/12/2019 1544   NITRITE NEGATIVE 10/12/2019 1544   LEUKOCYTESUR NEGATIVE  10/12/2019 1544   Sepsis Labs: Recent Results (from the past 240 hour(s))  Urine Culture     Status: Abnormal   Collection Time: 10/12/19  3:44 PM   Specimen: Urine, Random  Result Value Ref Range Status   Specimen Description   Final    URINE, RANDOM Performed at [facility], [address], [place], [place] [address]    Special Requests   Final    NONE Performed at [facility], [address] [place], [place] [address]    Culture >=100,000 COLONIES/mL ESCHERICHIA COLI (A)  Final   Report Status 10/14/2019 FINAL  Final   Organism ID, Bacteria ESCHERICHIA COLI (A)  Final      Susceptibility   Escherichia coli - MIC*    AMPICILLIN >=32 RESISTANT Resistant     CEFAZOLIN <=4 SENSITIVE Sensitive     CEFTRIAXONE <=0.25 SENSITIVE Sensitive     CIPROFLOXACIN <=0.25 SENSITIVE Sensitive     GENTAMICIN <=1 SENSITIVE Sensitive     IMIPENEM <=0.25 SENSITIVE Sensitive     NITROFURANTOIN <=16 SENSITIVE Sensitive     TRIMETH/SULFA <=20 SENSITIVE Sensitive     AMPICILLIN/SULBACTAM >=32 RESISTANT Resistant     PIP/TAZO <=4 SENSITIVE Sensitive     * >=100,000 COLONIES/mL ESCHERICHIA COLI  MRSA PCR Screening     Status: None   Collection Time: 10/13/19  4:08 AM   Specimen: Nasopharyngeal  Result Value Ref Range Status   MRSA by PCR NEGATIVE NEGATIVE Final    Comment:        The GeneXpert MRSA Assay (FDA approved for NASAL specimens only), is one component of a comprehensive MRSA colonization surveillance program. It is not intended to diagnose MRSA infection nor to guide or monitor treatment for MRSA infections. Performed at [facility], [address] [place], [place] [address]   Culture, blood (Routine X 2) w Reflex to ID Panel     Status: None   Collection Time: 10/13/19  6:36 PM   Specimen: BLOOD LEFT HAND  Result Value Ref Range Status   Specimen Description BLOOD LEFT HAND  Final   Special Requests   Final    BOTTLES DRAWN AEROBIC ONLY Blood Culture adequate volume    Culture   Final    NO GROWTH 6 DAYS Performed at [facility], [address] [place], [place] [address]    Report Status 10/19/2019 FINAL  Final  Culture, blood (Routine X 2) w Reflex to ID Panel     Status: None   Collection Time: 10/13/19  6:36 PM   Specimen: BLOOD LEFT HAND  Result Value Ref Range Status   Specimen Description BLOOD LEFT HAND  Final   Special Requests   Final    BOTTLES DRAWN AEROBIC AND ANAEROBIC Blood Culture adequate volume   Culture   Final    NO GROWTH 6 DAYS Performed at [facility], [address], [place],  [address]    Report Status 10/19/2019 FINAL  Final     Scheduled Meds: . (feeding supplement) PROSource Plus  30 mL Oral TID BM  . sodium chloride   Intravenous Once  . vitamin  C  500 mg Oral Daily  . busPIRone  5 mg Oral TID  . Chlorhexidine Gluconate Cloth  6 each Topical Daily  . digoxin  0.125 mg Oral ONCE-1600  . [START ON 10/20/2019] digoxin  0.25 mg Oral Daily  . diltiazem  60 mg Oral Q6H  . hyoscyamine  0.25 mg Sublingual TID AC  . [START ON 10/20/2019] influenza vac split quadrivalent PF  0.5 mL Intramuscular Tomorrow-1000  . insulin aspart  0-20 Units Subcutaneous TID WC  . iron polysaccharides  150 mg Oral Daily  . mirtazapine  15 mg Oral QHS  . multivitamin-lutein  1 capsule Oral Daily  . pantoprazole  40 mg Oral BID AC  . sodium chloride flush  10-40 mL Intracatheter Q12H  . tamsulosin  0.4 mg Oral QPC supper  . zinc sulfate  220 mg Oral Daily   Continuous Infusions: . sodium chloride    . cefTRIAXone (ROCEPHIN)  IV 2 g (10/18/19 2152)  . diltiazem (CARDIZEM) infusion 5 mg/hr (10/18/19 0950)  . lactated ringers 20 mL/hr at 10/18/19 0000    Procedures/Studies: CT ANGIO CHEST PE W OR WO CONTRAST  Result Date: 10/03/2019 CLINICAL DATA:  COVID-19 positive, weakness, dizziness and shortness of breath EXAM: CT ANGIOGRAPHY CHEST WITH CONTRAST TECHNIQUE: Multidetector CT imaging of the chest was performed using the standard  protocol during bolus administration of intravenous contrast. Multiplanar CT image reconstructions and MIPs were obtained to evaluate the vascular anatomy. CONTRAST:  158m OMNIPAQUE IOHEXOL 350 MG/ML SOLN COMPARISON:  Radiograph 09/28/2019, CT abdomen pelvis 08/15/2018 FINDINGS: Cardiovascular: Satisfactory opacification the pulmonary arteries to the segmental level. No pulmonary artery filling defects are identified. Central pulmonary arteries are normal caliber. Suboptimal opacification of the thoracic aorta. No discernible acute abnormality of the thoracic aorta proximal great vessels. Aberrant right subclavian artery. Borderline cardiac enlargement. Slight reflux of contrast into the IVC. Mediastinum/Nodes: No mediastinal fluid or gas. Normal thyroid gland and thoracic inlet. No acute abnormality of the trachea or esophagus. No worrisome mediastinal, hilar or axillary adenopathy. Lungs/Pleura: Mixed areas of heterogeneous consolidative and ground-glass opacity throughout both lungs with a basilar and peripheral predominance. No pneumothorax or visible effusion. Diffuse airways thickening. Upper Abdomen: Indeterminate intermediate attenuation (38 HU) lesion measuring up to 8.5 cm in maximum transaxial dimension within the right lobe liver (4/80). Cholelithiasis with a partially calcified gallstone towards the gallbladder neck. Multiple soft tissue attenuation lesions present along the lesser curvature/gastrohepatic ligament. Largest measuring up to 4.6 cm in size (4/91). Partially exophytic 0.7 cm lesion arising from the upper pole left kidney, corresponding with a previously seen fluid attenuation cyst on prior comparison CT, could reflect intracystic hemorrhage/proteinaceous debris. Musculoskeletal: Multilevel degenerative changes are present in the imaged portions of the spine. No acute osseous abnormality or suspicious osseous lesion. Probable intramuscular lipoma of the right infraspinatus measuring up to  6.9 by 3.5 cm (4/20). No other acute or worrisome chest wall lesions. Review of the MIP images confirms the above findings. IMPRESSION: 1. No evidence of acute pulmonary artery filling defects. 2. Mixed areas of heterogeneous consolidative and ground-glass opacity throughout both lungs with a basilar and peripheral predominance. Findings are compatible with multifocal pneumonia compatible with a COVID-19 etiology. 3. Indeterminate 4.8 cm lesion in the right lobe liver, incompletely characterized. Additional multiple soft tissue attenuation lesions along the lesser curvature/gastrohepatic ligament, largest measuring up to 4.6 cm in size. These findings are new from comparison abdominal CT 08/15/2018 and could raise concern for potential malignancy. Could consider further evaluation  with abdominal MR with contrast. 4. Additional 7 mm hyperdense lesion arising from the upper pole left kidney, could reflect proteinaceous cysts, Ob also be better evaluated on dedicated abdominal imaging. 5. Cholelithiasis. 6. Aberrant right subclavian artery. These results will be called to the ordering clinician or representative by the Radiologist Assistant, and communication documented in the PACS or [employer]. Electronically Signed   By: Lovena Le M.D.   On: 10/03/2019 15:38   CT ABDOMEN PELVIS W CONTRAST  Result Date: 10/11/2019 CLINICAL DATA:  COVID positive. EXAM: CT ABDOMEN AND PELVIS WITH CONTRAST TECHNIQUE: Multidetector CT imaging of the abdomen and pelvis was performed using the standard protocol following bolus administration of intravenous contrast. CONTRAST:  51m OMNIPAQUE IOHEXOL 300 MG/ML  SOLN COMPARISON:  August 15, 2018 FINDINGS: Lower chest: Marked severity bilateral lower lobe infiltrates are seen. Hepatobiliary: Multiple large heterogeneous low-attenuation liver masses are seen. The largest measures approximately 6.0 cm x 5.3 cm x 6.1 cm. A 1.7 cm gallstone is seen within the neck of an otherwise  normal-appearing gallbladder. Pancreas: Unremarkable. No pancreatic ductal dilatation or surrounding inflammatory changes. Spleen: Normal in size without focal abnormality. Adrenals/Urinary Tract: Adrenal glands are unremarkable. Kidneys are normal in size, without renal calculi or hydronephrosis. A stable 1.2 cm diameter area of heterogeneous low attenuation is seen within the medial aspect of the mid to lower right kidney. A stable 1.2 cm cyst is seen along the posterior aspect of the upper pole of the left kidney. Bladder is unremarkable. Stomach/Bowel: A 3.0 cm x 8.7 cm x 6.3 cm heterogeneous soft tissue mass is seen extending from the wall of the lesser sac of the stomach into the gastric lumen (axial CT images 15 through 25, CT series number 2). Appendix appears normal. There is no evidence of bowel dilatation. Numerous diverticula are seen throughout the descending and sigmoid colon. Moderate severity thickening of the proximal sigmoid colon is noted (best seen on coronal reformatted images 43 through 50, CT series number 5). Vascular/Lymphatic: No significant vascular findings are present. Reproductive: The prostate gland is markedly enlarged. Other: Multiple round heterogeneous soft tissue masses of various sizes are seen within the upper abdomen, adjacent to the lesser sac of the stomach. The largest measures approximately 5.2 cm x 5.6 cm x 4.6 cm. Musculoskeletal: Multilevel degenerative changes are seen throughout the lumbar spine. IMPRESSION: 1. Marked severity bilateral lower lobe infiltrates. 2. Multiple large heterogeneous low-attenuation liver masses, consistent with metastatic disease. 3. Large gastric mass, suspicious for primary neoplasm. 4. Multiple soft tissue masses within the mesentery of the upper abdomen, adjacent to the lesser sac of the stomach, consistent with metastatic disease. 5. Colonic diverticulosis. 6. Thickening of the proximal sigmoid colon which may represent sequelae  associated with mild colitis/diverticulitis correlation with follow-up abdomen pelvis CT is recommended to exclude the presence of an underlying neoplasm. 7. Stable area of low attenuation within the right kidney. While this may represent a hemorrhagic cyst, correlation with renal ultrasound is recommended. 8. Cholelithiasis. Electronically Signed   By: TVirgina NorfolkM.D.   On: 10/11/2019 23:24   MR LIVER W WO CONTRAST  Result Date: 10/04/2019 CLINICAL DATA:  Evaluate liver lesions and upper abdominal adenopathy seen on recent chest CT. EXAM: MRI ABDOMEN WITHOUT AND WITH CONTRAST TECHNIQUE: Multiplanar multisequence MR imaging of the abdomen was performed both before and after the administration of intravenous contrast. CONTRAST:  174mGADAVIST GADOBUTROL 1 MMOL/ML IV SOLN COMPARISON:  CT abdomen/pelvis 08/15/2018 FINDINGS: Lower chest: The lungs demonstrate  changes of COVID pneumonia as demonstrated on today's chest CT. Hepatobiliary: Numerous diffusion positive hepatic metastatic lesions are noted. Segment 4A lesion measures 3.7 cm on image 7/8. 5 cm lesion and segment 7 on image 12/8. 5 cm lesion in segment 6 on image 18/8. Several other smaller lesions are noted. No intrahepatic biliary dilatation. There is diffuse fatty infiltration of the liver noted. A 2 cm gallstone is noted in the gallbladder. No common bile duct dilatation. Pancreas:  No mass, inflammation or ductal dilatation. Spleen:  Normal size. No focal lesions. Adrenals/Urinary Tract: The adrenal glands and kidneys are unremarkable. Stomach/Bowel: There is a large infiltrating gastric mass involving the fundal region 6.5 x 4.8 cm and is diffusion positive. Moderate contrast enhancement is noted. Adjacent gastrohepatic ligament lymphadenopathy with the largest node measuring 4.6 cm. Vascular/Lymphatic: The aorta is normal in caliber. No dissection. The branch vessels are patent. No retroperitoneal lymphadenopathy. Other:  No ascites or  abdominal wall hernia. Musculoskeletal: No worrisome bone lesions. IMPRESSION: 1. 6.5 x 4.8 cm infiltrating gastric mass involving the fundal region. Associated gastrohepatic ligament lymphadenopathy. 2. Numerous hepatic metastatic lesions. 3. Cholelithiasis. Electronically Signed   By: Marijo Sanes M.D.   On: 10/04/2019 06:26   US RENAL  Result Date: 10/13/2019 CLINICAL DATA:  Acute kidney injury, recent discovery of abdominal and liver masses. EXAM: RENAL / URINARY TRACT ULTRASOUND COMPLETE COMPARISON:  MRI 10/04/2019 and CT 10/11/2019 FINDINGS: Right Kidney: Renal measurements: 12.9 x 5.6 x 5.7 cm = volume: 213 mL. Echogenicity within normal limits. No mass or hydronephrosis visualized. Left Kidney: Renal measurements: 14 x 6.3 x 5.6 cm = volume: 258 mL. Echogenicity within normal limits. No mass or hydronephrosis visualized. Bladder: Foley catheter in the urinary bladder. Other: Incidental liver masses in the RIGHT hepatic lobe measuring 4.3 x 3.4 x 3.6 cm and 6.3 x 4.4 x 5.6 cm, grossly similar accounting for differences in angle of measurement and technique as compared to recent imaging studies. One of these measurements may combine the dimensions of 2 adjacent masses, would refer to previous CT and MRI for follow-up measurements. IMPRESSION: 1. No hydronephrosis. 2. Hepatic metastatic disease better visualized on recent CTs. Electronically Signed   By: Zetta Bills M.D.   On: 10/13/2019 14:20   DG CHEST PORT 1 VIEW  Result Date: 10/13/2019 CLINICAL DATA:  History of COVID-19 positivity, status post left PICC line placement EXAM: PORTABLE CHEST 1 VIEW COMPARISON:  09/28/2019 FINDINGS: Cardiac shadow is stable. Patchy airspace opacities are noted in both lungs increased from the prior exam particularly in the left retrocardiac region consistent with progressive COVID-19 pneumonia. Left-sided PICC line is seen with the catheter tip at the cavoatrial junction. No bony abnormality is noted. IMPRESSION:  PICC line in satisfactory position. Increase in the degree of bilateral patchy airspace opacity consistent with COVID-19 positivity. Electronically Signed   By: Inez Catalina M.D.   On: 10/13/2019 20:06   DG Chest Port 1 View  Result Date: 09/28/2019 CLINICAL DATA:  COVID EXAM: PORTABLE CHEST 1 VIEW COMPARISON:  09/24/2019 FINDINGS: Development of patchy bilateral airspace opacities. No pleural effusion. Stable cardiomediastinal silhouette. No pneumothorax. IMPRESSION: Development of patchy bilateral airspace opacities, consistent with bilateral pneumonia. Electronically Signed   By: Donavan Foil M.D.   On: 09/28/2019 23:14   DG Chest Port 1 View  Result Date: 09/24/2019 CLINICAL DATA:  COVID pneumonia, weakness, fatigue EXAM: PORTABLE CHEST 1 VIEW COMPARISON:  09/15/2019 FINDINGS: The lungs are symmetrically expanded. Minimal left basilar atelectasis is again  noted. No superimposed confluent pulmonary infiltrate. No pneumothorax or pleural effusion. Cardiac size within normal limits. The pulmonary vascularity is normal. No acute bone abnormality. IMPRESSION: Minimal left basilar atelectasis, unchanged. No superimposed confluent pulmonary infiltrate. Electronically Signed   By: Fidela Salisbury MD   On: 09/24/2019 20:15   ECHOCARDIOGRAM COMPLETE  Result Date: 09/30/2019    ECHOCARDIOGRAM REPORT   Patient Name:   JERMICHAEL BELMARES Date of Exam: 09/30/2019 Medical Rec #:  [id number]         Height:       72.0 in Accession #:    [id number]        Weight:       237.0 lb Date of Birth:  18-Mar-1955        BSA:          2.290 m Patient Age:    8 years          BP:           95/60 mmHg Patient Gender: M                 HR:           126 bpm. Exam Location:  [facility] Procedure: 2D Echo Indications:    Endocarditis I38  History:        Patient has no prior history of Echocardiogram examinations.                 Risk Factors:Non-Smoker. Pneumonia due to COVID-19 virus,                 Sepsis, Lactic Acidosis.   Sonographer:    Leavy Cella RDCS (AE) Referring Phys: [id number] OLADAPO ADEFESO IMPRESSIONS  1. Left ventricular ejection fraction, by estimation, is 55 to 60%. The left ventricle has normal function. The left ventricle has no regional wall motion abnormalities. There is moderate left ventricular hypertrophy. Left ventricular diastolic parameters are indeterminate.  2. Right ventricular systolic function is normal. The right ventricular size is normal. Tricuspid regurgitation signal is inadequate for assessing PA pressure.  3. The mitral valve is grossly normal. No evidence of mitral valve regurgitation.  4. The aortic valve is tricuspid. Aortic valve regurgitation is not visualized.  5. The inferior vena cava is normal in size with <50% respiratory variability, suggesting right atrial pressure of 8 mmHg.  6. Views are somewhat limited, but no definite valvular vegetations are visualized. FINDINGS  Left Ventricle: Left ventricular ejection fraction, by estimation, is 55 to 60%. The left ventricle has normal function. The left ventricle has no regional wall motion abnormalities. The left ventricular internal cavity size was normal in size. There is  moderate left ventricular hypertrophy. Left ventricular diastolic parameters are indeterminate. Right Ventricle: The right ventricular size is normal. No increase in right ventricular wall thickness. Right ventricular systolic function is normal. Tricuspid regurgitation signal is inadequate for assessing PA pressure. Left Atrium: Left atrial size was normal in size. Right Atrium: Right atrial size was normal in size. Pericardium: There is no evidence of pericardial effusion. Mitral Valve: The mitral valve is grossly normal. No evidence of mitral valve regurgitation. Tricuspid Valve: The tricuspid valve is grossly normal. Tricuspid valve regurgitation is trivial. Aortic Valve: The aortic valve is tricuspid. Aortic valve regurgitation is not visualized. Pulmonic Valve:  The pulmonic valve was grossly normal. Pulmonic valve regurgitation is trivial. Aorta: The aortic root is normal in size and structure. Venous: The inferior vena cava is normal in size with less  than 50% respiratory variability, suggesting right atrial pressure of 8 mmHg. IAS/Shunts: No atrial level shunt detected by color flow Doppler.  LEFT VENTRICLE PLAX 2D LVIDd:         4.37 cm Diastology LVIDs:         2.20 cm LV e' medial:    7.18 cm/s LV PW:         1.72 cm LV E/e' medial:  9.2 LV IVS:        1.46 cm LV e' lateral:   12.90 cm/s                        LV E/e' lateral: 5.1  RIGHT VENTRICLE RV S prime:     16.00 cm/s TAPSE (M-mode): 1.5 cm LEFT ATRIUM             Index       RIGHT ATRIUM           Index LA diam:        3.70 cm 1.62 cm/m  RA Area:     15.40 cm LA Vol (A2C):   63.9 ml 27.90 ml/m RA Volume:   39.80 ml  17.38 ml/m LA Vol (A4C):   40.6 ml 17.73 ml/m LA Biplane Vol: 52.4 ml 22.88 ml/m   AORTA Ao Root diam: 3.50 cm MITRAL VALVE MV Area (PHT): 6.60 cm MV Decel Time: 115 msec MV E velocity: 66.00 cm/s MV A velocity: 39.80 cm/s MV E/A ratio:  1.66 Rozann Lesches MD Electronically signed by Rozann Lesches MD Signature Date/Time: 09/30/2019/4:58:16 PM    Final    [place] EKG SITE RITE  Result Date: 10/13/2019 If Site Rite image not attached, placement could not be confirmed due to current cardiac rhythm.   Roxan Hockey, MD  [facility]  If 7PM-7AM, please contact night-coverage [email or web address]  10/19/2019, 3:18 PM   LOS: 21 days

## 2019-10-19 NOTE — Consult Note (Signed)
Consultation Note Date: 10/19/2019   Patient Name: Stephen Wilcox  DOB: 01/08/1956  MRN: [id number]  Age / Sex: 64 y.o., male  PCP: Stephen Samples, PA-C Referring Physician: Roxan Hockey, MD  Reason for Consultation: Establishing goals of care  HPI/Patient Profile: 64 y.o. male  with no pertinent past medical history admitted on 09/28/2019 with increased weakness, fatigue, lightheaded, cough in setting of COVID-19 infection and pneumonia along with acute GIB and anemia. 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 Ongoing weakness and poor intake.   Clinical Assessment and Goals of Care: I met today at Stephen Wilcox' bedside. He is sitting up in recliner and attempting to eat some lunch. He really only takes a few bites before placing tray to the side. I offered supplement at bedside and he declines. He has had some pain medication and he reports that this is helping.   We have a discussion regarding his main issue being his cancer with ongoing weakness and poor intake. Unfortunately this means he is a poor candidate for treatment. He asks about surgical options and we discuss that the cancer has already spread to the liver and therefore surgery would not alleviate this problem. He indicates that he is a spiritual person and quotes the bible but then says that this still doesn't seem fair. I reassured him that it is not wrong to think that this is not fair and to not be happy with his circumstances and that this does not make him any less of a Christian. He talks of his 3 children and that he is the main caregiver for his wife who is on dialysis and part of PACE program. He talks of his daughter, Stephen Wilcox, and how proud he is and that he wants her to be  his HCPOA.   He does ask some questions about hospice. He is not prepared for full comfort care at this time and is hopeful for more time to spend with his family. We did discuss code status and he tells me "I do not want extraordinary measures done but maybe some measures done." I clarified further that when I consider extraordinary measures I consider this to be CPR, shock, breathing machines because these are only done when the heart and lungs fail and we are dying or already dead. The problem is these measures do not cure the underlying condition that has led to the decline and often only adds to suffering. He agrees that these are not measures that he would want. I encourage him to speak with his family about his situation and wishes and that we will talk more tomorrow.   All questions/concerns addressed. Emotional support provided. Discussed with Dr. Denton Brick who spoke with daughter, Stephen Wilcox. I will reach out to [facility] tomorrow since she has already spoken with the doctor today.   Primary Decision Maker PATIENT    SUMMARY OF RECOMMENDATIONS   - DNR decided -  Ongoing goals of care discussions  Code Status/Advance Care Planning:  DNR   Symptom Management:   Per attending  Palliative Prophylaxis:   Aspiration, Bowel Regimen, Delirium Protocol, Frequent Pain Assessment, Oral Care and Turn Reposition  Psycho-social/Spiritual:   Desire for further Chaplaincy support:yes  Additional Recommendations: Caregiving  Support/Resources, Education on Hospice and Grief/Bereavement Support  Prognosis:   Overall prognosis poor and < 6 weeks likely. Less if transition to comfort care.   Discharge Planning: To Be Determined      Primary Diagnoses: Present on Admission: . Pneumonia due to COVID-19 virus . Acute respiratory disease due to COVID-19 virus   I have reviewed the medical record, interviewed the patient and family, and examined the patient. The following aspects are  pertinent.  History reviewed. No pertinent past medical history. Social History   Socioeconomic History  . Marital status: Married    Spouse name: Not on file  . Number of children: Not on file  . Years of education: Not on file  . Highest education level: Not on file  Occupational History  . Not on file  Tobacco Use  . Smoking status: Never Smoker  . Smokeless tobacco: Never Used  Substance and Sexual Activity  . Alcohol use: Never  . Drug use: Never  . Sexual activity: Not on file  Other Topics Concern  . Not on file  Social History Narrative  . Not on file   Social Determinants of Health   Financial Resource Strain:   . Difficulty of Paying Living Expenses: Not on file  Food Insecurity:   . Worried About Charity fundraiser in the Last Year: Not on file  . Ran Out of Food in the Last Year: Not on file  Transportation Needs:   . Lack of Transportation (Medical): Not on file  . Lack of Transportation (Non-Medical): Not on file  Physical Activity:   . Days of Exercise per Week: Not on file  . Minutes of Exercise per Session: Not on file  Stress:   . Feeling of Stress : Not on file  Social Connections:   . Frequency of Communication with Friends and Family: Not on file  . Frequency of Social Gatherings with Friends and Family: Not on file  . Attends Religious Services: Not on file  . Active Member of Clubs or Organizations: Not on file  . Attends Archivist Meetings: Not on file  . Marital Status: Not on file   History reviewed. No pertinent family history. Scheduled Meds: . (feeding supplement) PROSource Plus  30 mL Oral TID BM  . sodium chloride   Intravenous Once  . vitamin C  500 mg Oral Daily  . busPIRone  5 mg Oral TID  . Chlorhexidine Gluconate Cloth  6 each Topical Daily  . digoxin  0.125 mg Oral ONCE-1600  . [START ON 10/20/2019] digoxin  0.25 mg Oral Daily  . diltiazem  60 mg Oral Q6H  . hyoscyamine  0.25 mg Sublingual TID AC  . [START ON  10/20/2019] influenza vac split quadrivalent PF  0.5 mL Intramuscular Tomorrow-1000  . insulin aspart  0-20 Units Subcutaneous TID WC  . iron polysaccharides  150 mg Oral Daily  . mirtazapine  15 mg Oral QHS  . multivitamin-lutein  1 capsule Oral Daily  . pantoprazole  40 mg Oral BID AC  . sodium chloride flush  10-40 mL Intracatheter Q12H  . tamsulosin  0.4 mg Oral QPC supper  . zinc sulfate  220 mg  Oral Daily   Continuous Infusions: . sodium chloride    . cefTRIAXone (ROCEPHIN)  IV 2 g (10/18/19 2152)  . diltiazem (CARDIZEM) infusion 5 mg/hr (10/18/19 0950)  . lactated ringers 20 mL/hr at 10/18/19 0000   PRN Meds:.albuterol, chlorpheniramine-HYDROcodone, guaiFENesin-dextromethorphan, HYDROcodone-acetaminophen, methocarbamol, Muscle Rub, ondansetron (ZOFRAN) IV, sodium chloride flush No Known Allergies Review of Systems  Constitutional: Positive for activity change, appetite change and fatigue.  Neurological: Positive for weakness.    Physical Exam Vitals and nursing note reviewed.  Constitutional:      General: He is not in acute distress.    Appearance: He is ill-appearing.  Cardiovascular:     Rate and Rhythm: Tachycardia present. Rhythm irregularly irregular.     Comments: Diltiazem infusion Pulmonary:     Effort: No tachypnea, accessory muscle usage or respiratory distress.     Comments: Purse lipped breathing at times during conversation Abdominal:     Palpations: Abdomen is soft.  Neurological:     Mental Status: He is alert and oriented to person, place, and time.     Comments: Difficulty to focus on conversation topic at times but otherwise seems to remember and understand his health circumstances and decisions at hand     Vital Signs: BP 134/79   Pulse (!) 121   Temp (!) 97.3 F (36.3 C) (Oral)   Resp (!) 23   Ht 6' (1.829 m)   Wt 115.4 kg   SpO2 93%   BMI 34.50 kg/m  Pain Scale: 0-10 POSS *See Group Information*: 1-Acceptable,Awake and alert Pain  Score: 0-No pain   SpO2: SpO2: 93 % O2 Device:SpO2: 93 % O2 Flow Rate: .O2 Flow Rate (L/min): 3.5 L/min  IO: Intake/output summary:   Intake/Output Summary (Last 24 hours) at 10/19/2019 1135 Last data filed at 10/18/2019 1639 Gross per 24 hour  Intake --  Output 750 ml  Net -750 ml    LBM: Last BM Date: 10/18/19 Baseline Weight: Weight: 107.5 kg Most recent weight: Weight: 115.4 kg     Palliative Assessment/Data:     Time In: 1230 Time Out: 1350 Time Total: 80 min Greater than 50%  of this time was spent counseling and coordinating care related to the above assessment and plan.  Signed by: [name], NP Palliative Medicine Team Pager # [phone] (M-F 8a-5p) Team Phone # [phone] (Nights/Weekends)

## 2019-10-19 NOTE — Progress Notes (Signed)
 -  on 10/19/19 -Extensive conversations with patient, palliative care provider and patient's daughter who lives in [place]--- current plan of care, underlying diagnosis, prognosis and goals of care discussed -Patient's RN Janett Billow present at bedside --he wishes to be a DNR/DNI -Patient will speak further with his daughter who lives in [place] and son who lives in [place]--- prior to making a decision on possible de-escalation of care and transition to comfort care/hospice  Roxan Hockey, MD

## 2019-10-20 DIAGNOSIS — C787 Secondary malignant neoplasm of liver and intrahepatic bile duct: Secondary | ICD-10-CM | POA: Diagnosis not present

## 2019-10-20 DIAGNOSIS — Z7189 Other specified counseling: Secondary | ICD-10-CM | POA: Diagnosis not present

## 2019-10-20 DIAGNOSIS — Z515 Encounter for palliative care: Secondary | ICD-10-CM | POA: Diagnosis not present

## 2019-10-20 LAB — GLUCOSE, CAPILLARY
Glucose-Capillary: 189 mg/dL — ABNORMAL HIGH (ref 70–99)
Glucose-Capillary: 216 mg/dL — ABNORMAL HIGH (ref 70–99)
Glucose-Capillary: 167 mg/dL — ABNORMAL HIGH (ref 70–99)

## 2019-10-20 NOTE — Progress Notes (Signed)
Palliative:  HPI: 64 y.o. male  with no pertinent past medical history admitted on 09/28/2019 with increased weakness, fatigue, lightheaded, cough in setting of COVID-19 infection and pneumonia along with acute GIB and anemia. 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 Ongoing weakness and poor intake.   I met today at Stephen Wilcox's bedside. He shares with me about changes in his life that led him to stronger faith. He shares that he has raised his children the best he could and has given them over to God to guide them in life. He tells me about many times and examples of when God appeared to him throughout his life. He appears to be having a life review. He also talks of hope that God will heal him as he wishes for more time with his family. We discussed how healing will come - if not here than in heaven.   I called and spoke with daughter, Stephen Wilcox (he calls her Stephen Wilcox). She has good understanding of her father's condition. I explained to her my conversations with him with desire for DNR, strong faith and hopes for miracle, but noted that he is praying for a miracle and not necessarily expecting one. He goes back and forth with questions about hospice and then saying he desires to get better and try rehab. However, he continues with extremely poor intake and this is not conducive for rehabilitation. Stephen Wilcox agrees and acknowledges that all I tell her sounds exactly like her father. I also explained that he is concerned about his children having the burden of dealing with his illness as well. Stephen Wilcox is planning to visit with him this weekend along with her brothers and anticipates that the goal of care will be for comfort care. We also discussed prognosis with comfort measures as likely weeks. Stephen Wilcox was appreciative of the update.   All questions/concerns  addressed. Emotional support provided.   Exam: Alert, oriented. No distress. Resting comfortably in bed. HR 80s and off diltiazem infusion. Abd soft.   Plan: - Family planning visit to meet with Stephen Wilcox in person this weekend to further discuss goals of care.  - Anticipate need for hospice facility.   [place], NP Palliative Medicine Team Pager [phone] (Please see [email or web address] for schedule) Team Phone [phone]    Greater than 50%  of this time was spent counseling and coordinating care related to the above assessment and plan

## 2019-10-20 NOTE — Progress Notes (Signed)
Report given to [facility] on 300.

## 2019-10-20 NOTE — Progress Notes (Signed)
- ---   Okay to discontinue airborne isolation precautions as of 10/20/2019   --Discussion:-Patient was diagnosed with COVID-19 infection on 09/24/2019, however patient symptoms started on or before 09/20/2019 -Patient's wife developed COVID-19 symptoms several days prior to that -At this time patient is more than 30 days from the initial onset of his symptoms -And more than 35 days out from exposure to Covid 19 from his sick wife  --- Faythe Ghee to discontinue airborne isolation precautions as of 10/20/2019  Roxan Hockey, MD

## 2019-10-20 NOTE — Progress Notes (Addendum)
PROGRESS NOTE  BREYSON KELM VAP:[id number] DOB: 1955-03-21 DOA: 09/28/2019 PCP: Jake Samples, PA-C  Brief History:  64 year old male without any documented significant chronic medical problems presenting to the ED on 09/24/2019 with lightheadedness malaise, and some generalized weakness.  Patient was diagnosed with positive Covid.  He had declined monoclonal antibody infusion.  On 09/28/2019, the patient presented back to the emergency department with worsening generalized weakness, dizziness, and cough and malaise.  He was noted to have oxygen saturation of 82% room air and placed on 2 L nasal cannula.  Was subsequently started on remdesivir and IV steroids.  At the time of admission, the patient was noted to have a hemoglobin of 7.3 which was a significant drop when compared to 09/24/2019 when his hemoglobin was 9.1.  Hemoccult was noted to be positive during admission.  GI was consulted to assist with management.  Dr. Laural Golden saw the patient.  He did not feel that the patient had any overt active GI bleeding.  He recommended PPI infusion for 72 hours and subsequently transitioned to oral Protonix.  He recommended endoscopic evaluation if the patient's hemoglobin continued to drop again requiring transfusion.  The patient was transfused 2 units PRBC.  His hemoglobin went up to 9.3.  The patient's oxygen demand gradually increased to 6 L, but subsequently remained stable.  Blood cultures grew Staph epidermidis in 1 of 2 sets.  This was thought to be a contaminant.  Empiric antibiotics were discontinued.  On 09/29/2019, the patient was noted to have atrial fibrillation.  EKG confirmed atrial fibrillation.  The patient was started on oral diltiazem.  Echocardiogram on 09/30/2019 showed EF 55 to 60%, no WMA. ---- Okay to discontinue airborne isolation precautions as of 10/20/2019   Assessment/Plan: Severe Sepsis with Septic Shock Secondary to E. coli infection with Acute Hypotension/Elevated  Lactic Acid/Leukocytosis -In retrospect severe sepsis with septic shock was most likely NOT present on admission -However -on 10/13/19 Clinically patient met criteria for severe E. coli sepsis with hemodynamic instability as above -elevated WBC's, lactic acid and episode of hypoglycemia. -Hemodynamic instability resolved, patient weaned off IV phenylephrine pressure support on 10/14/2019 -Urine culture from 10/12/2019 with E. coli that is resistant to penicillins, okay to de-escalate from Vanco and cefepime to Rocephin (completed iv Rocephin) -Flagyl discontinued -Blood cultures from 10/13/2019 NGTD - negative MRSA PCR -Lactic acid peaked at 3.6 PCT 125.85 WBC  52.6 >> 32.1 >>17.0>> 10.3>>8.3 -Sepsis pathophysiology has resolved, current tachycardia is most likely secondary to atrial flutter rather than sepsis  Acute metabolic encephalopathy---resolved -In the setting of acute urinary retention and E Coli UTI/Sepsis -Patient mentation back to baseline and currently oriented x3.  Acute respiratory failure with hypoxia secondary to COVID-19 pneumonia -currently 3 to 4 L  Oxygen  -He has completed his course of steroids and remdesivir -Continue vitamin C and zinc -CRP 19.4>> 15.8>> 7.9>> 3.6>> 1.7>> 1.0>>0.8 -Ferritin 59>> 70>> 95>> 90>> 67>> 53>>50 -D-dimer 1.89>> 1.48>> 1.26>> 1.13>> 1.31>> 1.48>>1.71 -10/03/19 CTA chest--no PE; 4.8 cm lesion in the right lobe liver, incompletely characterized. Additional multiple soft tissue attenuation lesions along the lesser curvature/gastrohepatic ligament, largest measuring up to 4.6 cm in size COVID-19 Labs  No results for input(s): DDIMER, FERRITIN, LDH, CRP in the last 72 hours.  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 Delton Coombes who will follow him up as an outpatient - CT abdomen and pelvis with contrast performed  to complete staging process -Current nutritional and functional status precludes chemotherapy   Anorexia/FTT/adjustment disorder with depressed mood--  patient's appetite remains very very poor -Continue Remeron for appetite stimulation and sleep and mood -Dietitian recommendations appreciated; continue calorie counting. -If patient is unable to maintain 1800 -calorie/day intake on full liquid diet, -Not a candidate forr PEG tube placement due to adenocarcinoma of the stomach -Patient currently has left arm PICC line  Symptomatic Anemia/heme positive stool -Appreciate GI consult -Dr. Laural Golden followed the patient--> -Transfused 4 units PRBC during this hospitalization; last 2 units ordered on 10/13/2019 in the setting of acute drop in his hemoglobin count; part of these secondary to hemodilution after aggressive fluid resuscitation).. -in retrospect, gastric adenocarcinoma may have been source of bleed -Continue to follow hemoglobin trend and further transfuse as needed for hemoglobin less than 7. -Continue p.o. Protonix  New onset atrial fibrillation---With persistent RVR -09/30/2019 echo EF 55 to 60%, no WMA -CHA2DS2-VASc =  1 -TSH--0.733 -Will continue holding on anticoagulation/aspirin in light of high risk for bleeding from gastric lesion. -BP to soft to add metoprolol so added digoxin on 10/14/2019 -Added oral Cardizem on 10/18/2019, -- we are continuing to attempt to wean off IV Cardizem  Diabetes mellitus type 2 -09/28/2019 hemoglobin A1c 6.9 Use Novolog/Humalog Sliding scale insulin with Accu-Cheks/Fingersticks as ordered -Holding a nightly sliding scale insulin at this time.  Microcytic anemia/Iron deficiency -iron saturation 3% -ferritin low despite COVID-19 -Case discussed with Dr. Delton Coombes who has recommended initiation of Niferex -Once medically stable IV iron infusion also recommended. -Continue transfusion as needed.  Class II obesity -BMI 32.14---this increases overall morbidity and mortality -Lifestyle modification, portion control, low calorie diet recommended after resolution of acute illness  Acute kidney injury -Renal ultrasound without hydronephrosis or obstructive uropathy -Foley catheter placed -Urine culture with E. coli as above #1 -Renal function improved/normalized with IV fluids and treatment of E. coli UTI --Renally adjust medications, avoid nephrotoxic agents / dehydration  / hypotension -Creatinine has normalized (peaked at 2.0), baseline usually around 0.7  BPH/urinary retention -Continue Flomax -Foley removed on 10/16/2019, patient failed voiding trial and Foley was reinserted on 10/16/2019 -Renal ultrasound without obstructive uropathy or hydronephrosis  Bacteremia -Blood cultures from 09/28/2019 Staph epidermidis 1 out of 2 sets -Represents contaminant -Repeat blood cultures from 10/13/2019 NGTD  Generalized Weakness and Deconditioning -PT evaluation--> skilled nursing facility with which the patient and daughter agree. -Poor nutritional status, Covid infection, underlying gastric malignancy or contributing to patient's weakness and  deconditioning  Social/Ethics---  -on 10/19/19 -Extensive conversations with patient, palliative care provider and patient's daughter who lives in [place]--- current plan of care, underlying diagnosis, prognosis and goals of care discussed -Patient's RN Janett Billow present at bedside --he  wishes to be a DNR/DNI -Patient will speak further with his daughter who lives in [place] and son who lives in [place]--- prior to making a decision on possible de-escalation of care and transition to comfort care/hospice  Status is: Inpatient  Remains inpatient appropriate because: Unable to wean off IV Cardizem drip, continues to require IV antibiotics for E. coli sepsis   disposition: The patient is from: Home  Anticipated d/c is to: SNF  Anticipated d/c date is: to be determined  Patient currently is not medically stable to d/c.  Unable to wean off IV Cardizem drip, continues to require IV antibiotics for E. coli sepsis   Family Communication:   Discussed with Daughter [name] at [phone]  Consultants:  GI--Rehman  Code Status:  DNR  DVT Prophylaxis:  SCDs (Gi Bleed)  Procedures: As Listed in Progress Note Above  Antibiotics: None  Subjective: - 10/20/19 No fevers no vomiting or diarrhea -Appetite is poor -Denies chest pain or abdominal pain -Able to get from bed to chair with assistance  --- Okay to discontinue airborne isolation precautions as of 10/20/2019   --Discussion:-Patient was diagnosed with COVID-19 infection on 09/24/2019, however patient symptoms started on or before 09/20/2019 -Patient's wife developed COVID-19 symptoms several days prior to that -At this time patient is more than 30 days from the initial onset of his symptoms -And more than 35 days out from exposure to Covid 19 from his sick wife  --- Okay to discontinue airborne isolation precautions as of 10/20/2019  Objective: Vitals:   10/20/19  1000 10/20/19 1100 10/20/19 1126 10/20/19 1539  BP: (!) 103/58 117/60  120/69  Pulse: 87 91  97  Resp: (!) 23 (!) 27  20  Temp:   98.3 F (36.8 C) 98 F (36.7 C)  TempSrc:   Oral Oral  SpO2: 97% 96%  99%  Weight:      Height:        Intake/Output Summary (Last 24 hours) at 10/20/2019 1737 Last data filed at 10/20/2019 0500 Gross per 24 hour  Intake 509.53 ml  Output 375 ml  Net 134.53 ml   Weight change:   Exam: General exam: Alert, awake, oriented x 3; obese, no conversational dyspnea Nose- [place] 3.5 L/min Respiratory system: No wheezing, few scattered rhonchi, fair air movement Cardiovascular system: Irregular irregular, tachy gastrointestinal system: Abdomen is nondistended, soft and without guarding. . Normal bowel sounds heard. Neuro=: Generalized weakness,No focal neurological deficits. Extremities: No cyanosis or clubbing, left arm PICC line in situ Skin: No rashes, no petechiae. Psychiatry: Judgement and insight appear normal.  Flat affect ,   No suicidal ideation or hallucination. GU-Foley catheter in situ   Data Reviewed: I have personally reviewed following labs and imaging studies  Basic Metabolic Panel: Recent Labs  Lab 10/15/19 0435 10/17/19 0847 10/18/19 0525  NA 137 135 134*  K 3.6 3.7 3.8  CL 100 97* 96*  CO2 _0 GLUCOSE 164* 178* 166*  BUN _1 CREATININE 0.86 0.58* 0.62  CALCIUM 7.6* 7.5* 7.7*  MG 1.8  --   --    Liver Function Tests: No results for input(s): AST, ALT, ALKPHOS, BILITOT, PROT, ALBUMIN in the last 168 hours. CBC: Recent Labs  Lab 10/14/19 0848 10/15/19 0435 10/17/19 0847 10/18/19 0525  WBC 32.1* 17.0* 10.3 8.3  NEUTROABS  --   --  9.5*  --   HGB 8.1* 8.3* 8.6* 9.2*  HCT 25.5* 26.5* 29.0* 30.6*  MCV 80.4  81.5 83.1 82.7  PLT 97* 86* 76* 96*   CBG: Recent Labs  Lab 10/19/19 1118 10/19/19 1650 10/19/19 2126 10/20/19 0740 10/20/19 1124  GLUCAP 216* 221* 127* 189* 216*   Urine analysis:    Component  Value Date/Time   COLORURINE YELLOW 10/12/2019 1544   APPEARANCEUR HAZY (A) 10/12/2019 1544   LABSPEC 1.014 10/12/2019 1544   PHURINE 5.0 10/12/2019 1544   GLUCOSEU NEGATIVE 10/12/2019 1544   HGBUR MODERATE (A) 10/12/2019 1544   BILIRUBINUR NEGATIVE 10/12/2019 1544   KETONESUR NEGATIVE 10/12/2019 1544   PROTEINUR NEGATIVE 10/12/2019 1544   NITRITE NEGATIVE 10/12/2019 1544   LEUKOCYTESUR NEGATIVE 10/12/2019 1544   Sepsis Labs: Recent Results (from the past 240 hour(s))  Urine Culture     Status: Abnormal   Collection Time: 10/12/19  3:44 PM   Specimen: Urine, Random  Result Value Ref Range Status   Specimen Description   Final    URINE, RANDOM Performed at [facility], [address], [place], [place] [address]    Special Requests   Final    NONE Performed at [facility], [address], [place], [place] [address]    Culture >=100,000 COLONIES/mL ESCHERICHIA COLI (A)  Final   Report Status 10/14/2019 FINAL  Final   Organism ID, Bacteria ESCHERICHIA COLI (A)  Final      Susceptibility   Escherichia coli - MIC*    AMPICILLIN >=32 RESISTANT Resistant     CEFAZOLIN <=4 SENSITIVE Sensitive     CEFTRIAXONE <=0.25 SENSITIVE Sensitive     CIPROFLOXACIN <=0.25 SENSITIVE Sensitive     GENTAMICIN <=1 SENSITIVE Sensitive     IMIPENEM <=0.25 SENSITIVE Sensitive     NITROFURANTOIN <=16 SENSITIVE Sensitive     TRIMETH/SULFA <=20 SENSITIVE Sensitive     AMPICILLIN/SULBACTAM >=32 RESISTANT Resistant     PIP/TAZO <=4 SENSITIVE Sensitive     * >=100,000 COLONIES/mL ESCHERICHIA COLI  MRSA PCR Screening     Status: None   Collection Time: 10/13/19  4:08 AM   Specimen: Nasopharyngeal  Result Value Ref Range Status   MRSA by PCR NEGATIVE NEGATIVE Final    Comment:        The GeneXpert MRSA Assay (FDA approved for NASAL specimens only), is one component of a comprehensive MRSA colonization surveillance program. It is not intended to diagnose MRSA infection nor to guide or monitor  treatment for MRSA infections. Performed at [facility], [address] [place], [place] [address]   Culture, blood (Routine X 2) w Reflex to ID Panel     Status: None   Collection Time: 10/13/19  6:36 PM   Specimen: BLOOD LEFT HAND  Result Value Ref Range Status   Specimen Description BLOOD LEFT HAND  Final   Special Requests   Final    BOTTLES DRAWN AEROBIC ONLY Blood Culture adequate volume   Culture   Final    NO GROWTH 6 DAYS Performed at [facility], [address], [place], [place] [address]    Report Status 10/19/2019 FINAL  Final  Culture, blood (Routine X 2) w Reflex to ID Panel     Status: None   Collection Time: 10/13/19  6:36 PM   Specimen: BLOOD LEFT HAND  Result Value Ref Range Status   Specimen Description BLOOD LEFT HAND  Final   Special Requests   Final    BOTTLES DRAWN AEROBIC AND ANAEROBIC Blood Culture adequate volume   Culture   Final    NO GROWTH 6 DAYS Performed at  [facility], [address], [place], [place] [address]    Report Status 10/19/2019 FINAL  Final     Scheduled Meds: . (feeding supplement) PROSource Plus  30 mL Oral TID BM  . sodium chloride   Intravenous Once  . vitamin C  500 mg Oral Daily  . busPIRone  5 mg Oral TID  . Chlorhexidine Gluconate Cloth  6 each Topical Daily  . digoxin  0.25 mg Oral Daily  . diltiazem  60 mg Oral Q6H  . hyoscyamine  0.25 mg Sublingual TID AC  . influenza vac split quadrivalent PF  0.5 mL Intramuscular Tomorrow-1000  . insulin aspart  0-20 Units Subcutaneous TID WC  . iron polysaccharides  150 mg Oral Daily  . mirtazapine  15 mg Oral QHS  . multivitamin-lutein  1 capsule Oral Daily  . pantoprazole  40 mg Oral BID AC  . sodium chloride flush  10-40 mL Intracatheter Q12H  . tamsulosin  0.4 mg Oral QPC supper  . zinc sulfate  220 mg Oral Daily   Continuous Infusions: . sodium chloride    . cefTRIAXone (ROCEPHIN)  IV Stopped (10/19/19 2159)  . diltiazem (CARDIZEM) infusion 5 mg/hr (10/19/19 2030)  .  lactated ringers 20 mL/hr at 10/18/19 0000    Procedures/Studies: CT ANGIO CHEST PE W OR WO CONTRAST  Result Date: 10/03/2019 CLINICAL DATA:  COVID-19 positive, weakness, dizziness and shortness of breath EXAM: CT ANGIOGRAPHY CHEST WITH CONTRAST TECHNIQUE: Multidetector CT imaging of the chest was performed using the standard protocol during bolus administration of intravenous contrast. Multiplanar CT image reconstructions and MIPs were obtained to evaluate the vascular anatomy. CONTRAST:  177m OMNIPAQUE IOHEXOL 350 MG/ML SOLN COMPARISON:  Radiograph 09/28/2019, CT abdomen pelvis 08/15/2018 FINDINGS: Cardiovascular: Satisfactory opacification the pulmonary arteries to the segmental level. No pulmonary artery filling defects are identified. Central pulmonary arteries are normal caliber. Suboptimal opacification of the thoracic aorta. No discernible acute abnormality of the thoracic aorta proximal great vessels. Aberrant right subclavian artery. Borderline cardiac enlargement. Slight reflux of contrast into the IVC. Mediastinum/Nodes: No mediastinal fluid or gas. Normal thyroid gland and thoracic inlet. No acute abnormality of the trachea or esophagus. No worrisome mediastinal, hilar or axillary adenopathy. Lungs/Pleura: Mixed areas of heterogeneous consolidative and ground-glass opacity throughout both lungs with a basilar and peripheral predominance. No pneumothorax or visible effusion. Diffuse airways thickening. Upper Abdomen: Indeterminate intermediate attenuation (38 HU) lesion measuring up to 8.5 cm in maximum transaxial dimension within the right lobe liver (4/80). Cholelithiasis with a partially calcified gallstone towards the gallbladder neck. Multiple soft tissue attenuation lesions present along the lesser curvature/gastrohepatic ligament. Largest measuring up to 4.6 cm in size (4/91). Partially exophytic 0.7 cm lesion arising from the upper pole left kidney, corresponding with a previously seen  fluid attenuation cyst on prior comparison CT, could reflect intracystic hemorrhage/proteinaceous debris. Musculoskeletal: Multilevel degenerative changes are present in the imaged portions of the spine. No acute osseous abnormality or suspicious osseous lesion. Probable intramuscular lipoma of the right infraspinatus measuring up to 6.9 by 3.5 cm (4/20). No other acute or worrisome chest wall lesions. Review of the MIP images confirms the above findings. IMPRESSION: 1. No evidence of acute pulmonary artery filling defects. 2. Mixed areas of heterogeneous consolidative and ground-glass opacity throughout both lungs with a basilar and peripheral predominance. Findings are compatible with multifocal pneumonia compatible with a COVID-19 etiology. 3. Indeterminate 4.8 cm lesion in the right lobe liver, incompletely characterized. Additional multiple soft tissue attenuation lesions along the  lesser curvature/gastrohepatic ligament, largest measuring up to 4.6 cm in size. These findings are new from comparison abdominal CT 08/15/2018 and could raise concern for potential malignancy. Could consider further evaluation with abdominal MR with contrast. 4. Additional 7 mm hyperdense lesion arising from the upper pole left kidney, could reflect proteinaceous cysts, Ob also be better evaluated on dedicated abdominal imaging. 5. Cholelithiasis. 6. Aberrant right subclavian artery. These results will be called to the ordering clinician or representative by the Radiologist Assistant, and communication documented in the PACS or [employer]. Electronically Signed   By: Lovena Le M.D.   On: 10/03/2019 15:38   CT ABDOMEN PELVIS W CONTRAST  Result Date: 10/11/2019 CLINICAL DATA:  COVID positive. EXAM: CT ABDOMEN AND PELVIS WITH CONTRAST TECHNIQUE: Multidetector CT imaging of the abdomen and pelvis was performed using the standard protocol following bolus administration of intravenous contrast. CONTRAST:  44m OMNIPAQUE  IOHEXOL 300 MG/ML  SOLN COMPARISON:  August 15, 2018 FINDINGS: Lower chest: Marked severity bilateral lower lobe infiltrates are seen. Hepatobiliary: Multiple large heterogeneous low-attenuation liver masses are seen. The largest measures approximately 6.0 cm x 5.3 cm x 6.1 cm. A 1.7 cm gallstone is seen within the neck of an otherwise normal-appearing gallbladder. Pancreas: Unremarkable. No pancreatic ductal dilatation or surrounding inflammatory changes. Spleen: Normal in size without focal abnormality. Adrenals/Urinary Tract: Adrenal glands are unremarkable. Kidneys are normal in size, without renal calculi or hydronephrosis. A stable 1.2 cm diameter area of heterogeneous low attenuation is seen within the medial aspect of the mid to lower right kidney. A stable 1.2 cm cyst is seen along the posterior aspect of the upper pole of the left kidney. Bladder is unremarkable. Stomach/Bowel: A 3.0 cm x 8.7 cm x 6.3 cm heterogeneous soft tissue mass is seen extending from the wall of the lesser sac of the stomach into the gastric lumen (axial CT images 15 through 25, CT series number 2). Appendix appears normal. There is no evidence of bowel dilatation. Numerous diverticula are seen throughout the descending and sigmoid colon. Moderate severity thickening of the proximal sigmoid colon is noted (best seen on coronal reformatted images 43 through 50, CT series number 5). Vascular/Lymphatic: No significant vascular findings are present. Reproductive: The prostate gland is markedly enlarged. Other: Multiple round heterogeneous soft tissue masses of various sizes are seen within the upper abdomen, adjacent to the lesser sac of the stomach. The largest measures approximately 5.2 cm x 5.6 cm x 4.6 cm. Musculoskeletal: Multilevel degenerative changes are seen throughout the lumbar spine. IMPRESSION: 1. Marked severity bilateral lower lobe infiltrates. 2. Multiple large heterogeneous low-attenuation liver masses, consistent with  metastatic disease. 3. Large gastric mass, suspicious for primary neoplasm. 4. Multiple soft tissue masses within the mesentery of the upper abdomen, adjacent to the lesser sac of the stomach, consistent with metastatic disease. 5. Colonic diverticulosis. 6. Thickening of the proximal sigmoid colon which may represent sequelae associated with mild colitis/diverticulitis correlation with follow-up abdomen pelvis CT is recommended to exclude the presence of an underlying neoplasm. 7. Stable area of low attenuation within the right kidney. While this may represent a hemorrhagic cyst, correlation with renal ultrasound is recommended. 8. Cholelithiasis. Electronically Signed   By: TVirgina NorfolkM.D.   On: 10/11/2019 23:24   MR LIVER W WO CONTRAST  Result Date: 10/04/2019 CLINICAL DATA:  Evaluate liver lesions and upper abdominal adenopathy seen on recent chest CT. EXAM: MRI ABDOMEN WITHOUT AND WITH CONTRAST TECHNIQUE: Multiplanar multisequence MR imaging of the abdomen  was performed both before and after the administration of intravenous contrast. CONTRAST:  49m GADAVIST GADOBUTROL 1 MMOL/ML IV SOLN COMPARISON:  CT abdomen/pelvis 08/15/2018 FINDINGS: Lower chest: The lungs demonstrate changes of COVID pneumonia as demonstrated on today's chest CT. Hepatobiliary: Numerous diffusion positive hepatic metastatic lesions are noted. Segment 4A lesion measures 3.7 cm on image 7/8. 5 cm lesion and segment 7 on image 12/8. 5 cm lesion in segment 6 on image 18/8. Several other smaller lesions are noted. No intrahepatic biliary dilatation. There is diffuse fatty infiltration of the liver noted. A 2 cm gallstone is noted in the gallbladder. No common bile duct dilatation. Pancreas:  No mass, inflammation or ductal dilatation. Spleen:  Normal size. No focal lesions. Adrenals/Urinary Tract: The adrenal glands and kidneys are unremarkable. Stomach/Bowel: There is a large infiltrating gastric mass involving the fundal region  6.5 x 4.8 cm and is diffusion positive. Moderate contrast enhancement is noted. Adjacent gastrohepatic ligament lymphadenopathy with the largest node measuring 4.6 cm. Vascular/Lymphatic: The aorta is normal in caliber. No dissection. The branch vessels are patent. No retroperitoneal lymphadenopathy. Other:  No ascites or abdominal wall hernia. Musculoskeletal: No worrisome bone lesions. IMPRESSION: 1. 6.5 x 4.8 cm infiltrating gastric mass involving the fundal region. Associated gastrohepatic ligament lymphadenopathy. 2. Numerous hepatic metastatic lesions. 3. Cholelithiasis. Electronically Signed   By: PMarijo SanesM.D.   On: 10/04/2019 06:26   [place]NAL  Result Date: 10/13/2019 CLINICAL DATA:  Acute kidney injury, recent discovery of abdominal and liver masses. EXAM: RENAL / URINARY TRACT ULTRASOUND COMPLETE COMPARISON:  MRI 10/04/2019 and CT 10/11/2019 FINDINGS: Right Kidney: Renal measurements: 12.9 x 5.6 x 5.7 cm = volume: 213 mL. Echogenicity within normal limits. No mass or hydronephrosis visualized. Left Kidney: Renal measurements: 14 x 6.3 x 5.6 cm = volume: 258 mL. Echogenicity within normal limits. No mass or hydronephrosis visualized. Bladder: Foley catheter in the urinary bladder. Other: Incidental liver masses in the RIGHT hepatic lobe measuring 4.3 x 3.4 x 3.6 cm and 6.3 x 4.4 x 5.6 cm, grossly similar accounting for differences in angle of measurement and technique as compared to recent imaging studies. One of these measurements may combine the dimensions of 2 adjacent masses, would refer to previous CT and MRI for follow-up measurements. IMPRESSION: 1. No hydronephrosis. 2. Hepatic metastatic disease better visualized on recent CTs. Electronically Signed   By: GZetta BillsM.D.   On: 10/13/2019 14:20   DG CHEST PORT 1 VIEW  Result Date: 10/13/2019 CLINICAL DATA:  History of COVID-19 positivity, status post left PICC line placement EXAM: PORTABLE CHEST 1 VIEW COMPARISON:  09/28/2019  FINDINGS: Cardiac shadow is stable. Patchy airspace opacities are noted in both lungs increased from the prior exam particularly in the left retrocardiac region consistent with progressive COVID-19 pneumonia. Left-sided PICC line is seen with the catheter tip at the cavoatrial junction. No bony abnormality is noted. IMPRESSION: PICC line in satisfactory position. Increase in the degree of bilateral patchy airspace opacity consistent with COVID-19 positivity. Electronically Signed   By: MInez CatalinaM.D.   On: 10/13/2019 20:06   DG Chest Port 1 View  Result Date: 09/28/2019 CLINICAL DATA:  COVID EXAM: PORTABLE CHEST 1 VIEW COMPARISON:  09/24/2019 FINDINGS: Development of patchy bilateral airspace opacities. No pleural effusion. Stable cardiomediastinal silhouette. No pneumothorax. IMPRESSION: Development of patchy bilateral airspace opacities, consistent with bilateral pneumonia. Electronically Signed   By: KDonavan FoilM.D.   On: 09/28/2019 23:14   [facility]  Result Date: 09/24/2019 CLINICAL DATA:  COVID pneumonia, weakness, fatigue EXAM: PORTABLE CHEST 1 VIEW COMPARISON:  09/15/2019 FINDINGS: The lungs are symmetrically expanded. Minimal left basilar atelectasis is again noted. No superimposed confluent pulmonary infiltrate. No pneumothorax or pleural effusion. Cardiac size within normal limits. The pulmonary vascularity is normal. No acute bone abnormality. IMPRESSION: Minimal left basilar atelectasis, unchanged. No superimposed confluent pulmonary infiltrate. Electronically Signed   By: Fidela Salisbury MD   On: 09/24/2019 20:15   ECHOCARDIOGRAM COMPLETE  Result Date: 09/30/2019    ECHOCARDIOGRAM REPORT   Patient Name:   DAYMIAN LILL Date of Exam: 09/30/2019 Medical Rec #:  [id number]         Height:       72.0 in Accession #:    [id number]        Weight:       237.0 lb Date of Birth:  1956-01-05        BSA:          2.290 m Patient Age:    61 years          BP:           95/60 mmHg  Patient Gender: M                 HR:           126 bpm. Exam Location:  [facility] Procedure: 2D Echo Indications:    Endocarditis I38  History:        Patient has no prior history of Echocardiogram examinations.                 Risk Factors:Non-Smoker. Pneumonia due to COVID-19 virus,                 Sepsis, Lactic Acidosis.  Sonographer:    Leavy Cella RDCS (AE) Referring Phys: [id number] OLADAPO ADEFESO IMPRESSIONS  1. Left ventricular ejection fraction, by estimation, is 55 to 60%. The left ventricle has normal function. The left ventricle has no regional wall motion abnormalities. There is moderate left ventricular hypertrophy. Left ventricular diastolic parameters are indeterminate.  2. Right ventricular systolic function is normal. The right ventricular size is normal. Tricuspid regurgitation signal is inadequate for assessing PA pressure.  3. The mitral valve is grossly normal. No evidence of mitral valve regurgitation.  4. The aortic valve is tricuspid. Aortic valve regurgitation is not visualized.  5. The inferior vena cava is normal in size with <50% respiratory variability, suggesting right atrial pressure of 8 mmHg.  6. Views are somewhat limited, but no definite valvular vegetations are visualized. FINDINGS  Left Ventricle: Left ventricular ejection fraction, by estimation, is 55 to 60%. The left ventricle has normal function. The left ventricle has no regional wall motion abnormalities. The left ventricular internal cavity size was normal in size. There is  moderate left ventricular hypertrophy. Left ventricular diastolic parameters are indeterminate. Right Ventricle: The right ventricular size is normal. No increase in right ventricular wall thickness. Right ventricular systolic function is normal. Tricuspid regurgitation signal is inadequate for assessing PA pressure. Left Atrium: Left atrial size was normal in size. Right Atrium: Right atrial size was normal in size. Pericardium: There is no  evidence of pericardial effusion. Mitral Valve: The mitral valve is grossly normal. No evidence of mitral valve regurgitation. Tricuspid Valve: The tricuspid valve is grossly normal. Tricuspid valve regurgitation is trivial. Aortic Valve: The aortic valve is tricuspid. Aortic valve regurgitation is not visualized. Pulmonic Valve: The pulmonic  valve was grossly normal. Pulmonic valve regurgitation is trivial. Aorta: The aortic root is normal in size and structure. Venous: The inferior vena cava is normal in size with less than 50% respiratory variability, suggesting right atrial pressure of 8 mmHg. IAS/Shunts: No atrial level shunt detected by color flow Doppler.  LEFT VENTRICLE PLAX 2D LVIDd:         4.37 cm Diastology LVIDs:         2.20 cm LV e' medial:    7.18 cm/s LV PW:         1.72 cm LV E/e' medial:  9.2 LV IVS:        1.46 cm LV e' lateral:   12.90 cm/s                        LV E/e' lateral: 5.1  RIGHT VENTRICLE RV S prime:     16.00 cm/s TAPSE (M-mode): 1.5 cm LEFT ATRIUM             Index       RIGHT ATRIUM           Index LA diam:        3.70 cm 1.62 cm/m  RA Area:     15.40 cm LA Vol (A2C):   63.9 ml 27.90 ml/m RA Volume:   39.80 ml  17.38 ml/m LA Vol (A4C):   40.6 ml 17.73 ml/m LA Biplane Vol: 52.4 ml 22.88 ml/m   AORTA Ao Root diam: 3.50 cm MITRAL VALVE MV Area (PHT): 6.60 cm MV Decel Time: 115 msec MV E velocity: 66.00 cm/s MV A velocity: 39.80 cm/s MV E/A ratio:  1.66 Rozann Lesches MD Electronically signed by Rozann Lesches MD Signature Date/Time: 09/30/2019/4:58:16 PM    Final    [place] EKG SITE RITE  Result Date: 10/13/2019 If Site Rite image not attached, placement could not be confirmed due to current cardiac rhythm.   Roxan Hockey, MD  [facility]  If 7PM-7AM, please contact night-coverage [email or web address]  10/20/2019, 5:37 PM   LOS: 22 days

## 2019-10-21 LAB — COMPREHENSIVE METABOLIC PANEL
ALT: 43 U/L (ref 0–44)
AST: 21 U/L (ref 15–41)
Albumin: 1.9 g/dL — ABNORMAL LOW (ref 3.5–5.0)
Alkaline Phosphatase: 137 U/L — ABNORMAL HIGH (ref 38–126)
Anion gap: 8 (ref 5–15)
BUN: 18 mg/dL (ref 8–23)
CO2: 31 mmol/L (ref 22–32)
Calcium: 7.8 mg/dL — ABNORMAL LOW (ref 8.9–10.3)
Chloride: 94 mmol/L — ABNORMAL LOW (ref 98–111)
Creatinine, Ser: 0.61 mg/dL (ref 0.61–1.24)
GFR calc Af Amer: >60 mL/min (ref >60)
GFR calc non Af Amer: >60 mL/min (ref >60)
Glucose, Bld: 167 mg/dL — ABNORMAL HIGH (ref 70–99)
Potassium: 3.4 mmol/L — ABNORMAL LOW (ref 3.5–5.1)
Sodium: 133 mmol/L — ABNORMAL LOW (ref 135–145)
Total Bilirubin: 0.3 mg/dL (ref 0.3–1.2)
Total Protein: 4.7 g/dL — ABNORMAL LOW (ref 6.5–8.1)

## 2019-10-21 LAB — CBC
HCT: 27 % — ABNORMAL LOW (ref 39.0–52.0)
Hemoglobin: 8.2 g/dL — ABNORMAL LOW (ref 13.0–17.0)
MCH: 24.9 pg — ABNORMAL LOW (ref 26.0–34.0)
MCHC: 30.4 g/dL (ref 30.0–36.0)
MCV: 82.1 fL (ref 80.0–100.0)
Platelets: 175 10*3/uL (ref 150–400)
RBC: 3.29 MIL/uL — ABNORMAL LOW (ref 4.22–5.81)
RDW: 21.2 % — ABNORMAL HIGH (ref 11.5–15.5)
WBC: 6.2 10*3/uL (ref 4.0–10.5)
nRBC: 0 % (ref 0.0–0.2)

## 2019-10-21 LAB — GLUCOSE, CAPILLARY
Glucose-Capillary: 145 mg/dL — ABNORMAL HIGH (ref 70–99)
Glucose-Capillary: 156 mg/dL — ABNORMAL HIGH (ref 70–99)
Glucose-Capillary: 162 mg/dL — ABNORMAL HIGH (ref 70–99)

## 2019-10-21 MED ORDER — DILTIAZEM HCL 25 MG/5ML IV SOLN
10.0000 mg | Freq: Once | INTRAVENOUS | Status: AC
  Administered 2019-10-21: 10 mg via INTRAVENOUS
  Filled 2019-10-21: qty 5

## 2019-10-21 MED ORDER — MAGNESIUM SULFATE 2 GM/50ML IV SOLN
2.0000 g | Freq: Once | INTRAVENOUS | Status: AC
  Administered 2019-10-21: 2 g via INTRAVENOUS
  Filled 2019-10-21: qty 50

## 2019-10-21 MED ORDER — POTASSIUM CHLORIDE CRYS ER 20 MEQ PO TBCR
40.0000 meq | EXTENDED_RELEASE_TABLET | ORAL | Status: AC
  Administered 2019-10-21 – 2019-10-22 (×2): 40 meq via ORAL
  Filled 2019-10-21 (×2): qty 2

## 2019-10-21 MED ORDER — DIGOXIN 125 MCG PO TABS
0.1250 mg | ORAL_TABLET | Freq: Every day | ORAL | Status: DC
  Administered 2019-10-22 – 2019-10-25 (×4): 0.125 mg via ORAL
  Filled 2019-10-21 (×5): qty 1

## 2019-10-21 MED ORDER — DILTIAZEM HCL ER COATED BEADS 240 MG PO CP24
240.0000 mg | ORAL_CAPSULE | Freq: Every day | ORAL | Status: DC
  Administered 2019-10-22 – 2019-10-25 (×4): 240 mg via ORAL
  Filled 2019-10-21 (×4): qty 1

## 2019-10-21 NOTE — Progress Notes (Signed)
Received call from telemetry that patient heart rate in the 140s. Checked on patient and he was resting quietly in bed. All other vital signs stable except the heart rate. Patient denies any pain, nausea, shortness of breath, dizziness, or any heart palpitations. He was diaphoretic. I notified Dr. Denton Brick and received orders to push IV Cardizem 10 mg once. Administered medication per orders. Patient HR after 20 minutes was 112. Will continue to monitor and will notify night shift RN

## 2019-10-21 NOTE — TOC Progression Note (Signed)
Transition of Care [facility]) - Progression Note    Patient Details  Name: RONDY KRUPINSKI MRN: [id number] Date of Birth: April 20, 1955  Transition of Care [facility]) CM/SW Contact  Ihor Gully, LCSW Phone Number: 10/21/2019, 10:24 AM  Clinical Narrative:    Per attending, referral was made to [facility] for discussion of services available.   Expected Discharge Plan: [place] Barriers to Discharge: Continued Medical Work up  Expected Discharge Plan and Services Expected Discharge Plan: [place] In-house Referral: Clinical Social Work   Post Acute Care Choice: [place] Living arrangements for the past 2 months: Apartment                                       Social Determinants of Health (SDOH) Interventions    Readmission Risk Interventions No flowsheet data found.

## 2019-10-21 NOTE — TOC Progression Note (Signed)
Transition of Care [facility]) - Progression Note    Patient Details  Name: Stephen Wilcox MRN: [id number] Date of Birth: [birth year]  Transition of Care [facility]) CM/SW Contact  Ihor Gully, LCSW Phone Number: 10/21/2019, 1:11 PM  Clinical Narrative:    Colletta [place] with [facility] has spoken with patient's daughter and with patient. Daughter will come in town and visit with patient this weekend to discuss disposition decisions.    Expected Discharge Plan: [place] Barriers to Discharge: Continued Medical Work up  Expected Discharge Plan and Services Expected Discharge Plan: [place] In-house Referral: Clinical Social Work   Post Acute Care Choice: [place] Living arrangements for the past 2 months: Apartment                                       Social Determinants of Health (SDOH) Interventions    Readmission Risk Interventions No flowsheet data found.

## 2019-10-21 NOTE — Progress Notes (Signed)
GI has since signed off. Please contact us if further assistance is needed.

## 2019-10-21 NOTE — Progress Notes (Addendum)
PROGRESS NOTE  [name] VKP:[id number] DOB: September 22, 1955 DOA: 09/28/2019 PCP: Jake Samples, PA-C  Brief History:  64 year old male without any documented significant chronic medical problems presenting to the ED on 09/24/2019 with lightheadedness malaise, and some generalized weakness.  Patient was diagnosed with positive Covid.  He had declined monoclonal antibody infusion.  On 09/28/2019, the patient presented back to the emergency department with worsening generalized weakness, dizziness, and cough and malaise.  He was noted to have oxygen saturation of 82% room air and placed on 2 L nasal cannula.  Was subsequently started on remdesivir and IV steroids.  At the time of admission, the patient was noted to have a hemoglobin of 7.3 which was a significant drop when compared to 09/24/2019 when his hemoglobin was 9.1.  Hemoccult was noted to be positive during admission.  GI was consulted to assist with management.  Dr. Laural Golden saw the patient.  He did not feel that the patient had any overt active GI bleeding.  He recommended PPI infusion for 72 hours and subsequently transitioned to oral Protonix.  He recommended endoscopic evaluation if the patient's hemoglobin continued to drop again requiring transfusion.  The patient was transfused 2 units PRBC.  His hemoglobin went up to 9.3.  The patient's oxygen demand gradually increased to 6 L, but subsequently remained stable.  Blood cultures grew Staph epidermidis in 1 of 2 sets.  This was thought to be a contaminant.  Empiric antibiotics were discontinued.  On 09/29/2019, the patient was noted to have atrial fibrillation.  EKG confirmed atrial fibrillation.  The patient was started on oral diltiazem.  Echocardiogram on 09/30/2019 showed EF 55 to 60%, no WMA. ---- Okay to discontinue airborne isolation precautions as of 10/20/2019 -Patient anticipating transportation to residential hospice   Assessment/Plan: Severe Sepsis with Septic Shock  Secondary to E. coli infection with Acute Hypotension/Elevated Lactic Acid/Leukocytosis -In retrospect severe sepsis with septic shock was most likely NOT present on admission -However -on 10/13/19 Clinically patient met criteria for severe E. coli sepsis with hemodynamic instability as above -elevated WBC's, lactic acid and episode of hypoglycemia. -Hemodynamic instability resolved, patient weaned off IV phenylephrine pressure support on 10/14/2019 -Urine culture from 10/12/2019 with E. coli that is resistant to penicillins, okay to de-escalate from Vanco and cefepime to Rocephin (completed iv Rocephin) -Flagyl discontinued -Blood cultures from 10/13/2019 NGTD - negative MRSA PCR -Sepsis pathophysiology has resolved  Acute metabolic encephalopathy---resolved -In the setting of acute urinary retention and E Coli UTI/Sepsis -Patient mentation back to baseline and currently oriented x3.  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 Delton Coombes who will follow him up as an outpatient -Current nutritional and functional status precludes chemotherapy -Palliative care consult appreciated, patient also met with hospice team, anticipate  transfer to residential hospice with left respiratory see of less than 8 weeks  Anorexia/FTT/adjustment disorder with depressed mood--  patient's appetite remains very very poor -Continue Remeron for appetite stimulation and sleep and mood -Dietitian recommendations appreciated; continue calorie counting. -If patient is unable to maintain 1800 -calorie/day intake on full liquid diet, -Not a candidate forr PEG tube placement due to adenocarcinoma of the stomach -Patient currently has left arm PICC line  Symptomatic Anemia/heme positive stool -Appreciate GI consult -Dr. Laural Golden followed the patient--> -Transfused 4 units PRBC during this hospitalization; last 2 units ordered on 10/13/2019 in the setting of acute drop in his hemoglobin count; part of these secondary to hemodilution after aggressive fluid resuscitation).. -in retrospect, gastric adenocarcinoma may have been source of bleed -Continue to follow hemoglobin trend and further transfuse as needed for hemoglobin less than 7. -Continue p.o. Protonix  New onset atrial fibrillation/Flutter---With persistent RVR -09/30/2019 echo EF 55 to 60%, no WMA -CHA2DS2-VASc = 1 -TSH--0.733 -Will continue holding on anticoagulation/aspirin in light of high risk for bleeding from gastric lesion. Continue digoxin and Cardizem for rate control -  Diabetes mellitus type 2 -09/28/2019 hemoglobin A1c 6.9 Use Novolog/Humalog Sliding scale insulin with Accu-Cheks/Fingersticks as ordered -Holding a nightly sliding scale insulin at this time.  Microcytic anemia/Iron deficiency -iron saturation 3% -ferritin low despite COVID-19 -Case discussed with Dr. Delton Coombes who has recommended initiation of Niferex -Continue transfusion as needed.  Class II obesity -BMI 34.14---this increases overall morbidity and mortality  Acute kidney injury -Renal ultrasound without hydronephrosis or obstructive uropathy -Foley catheter placed -Urine culture with E.  coli as above #1 -Renal function improved/normalized with IV fluids and treatment of E. coli UTI --Renally adjust medications, avoid nephrotoxic agents / dehydration  / hypotension -Creatinine has normalized (peaked at 2.0), baseline usually around 0.7  BPH/urinary retention -Continue Flomax -Foley removed on 10/16/2019, patient failed voiding trial and Foley was reinserted on 10/16/2019 -Renal ultrasound without obstructive uropathy or hydronephrosis  Bacteremia -Blood cultures from 09/28/2019 Staph epidermidis 1 out of 2 sets -Represents contaminant -Repeat blood cultures from 10/13/2019 NGTD  Generalized Weakness and Deconditioning -PT evaluation--> skilled nursing facility -Poor nutritional status, Covid infection, underlying gastric malignancy or contributing to patient's weakness and deconditioning -Anticipate transition to residential hospice  Social/Ethics---  -Extensive conversations with patient, palliative care provider and patient's daughter who lives in [place]--- current plan of care, underlying diagnosis, prognosis and goals of care discussed --he wishes to be a DNR/DNI --Palliative care consult appreciated, patient also met with hospice team, anticipate transfer to residential hospice with left respiratory see of less than 8 weeks --Patient's daughter who lives in [place] and patient's son who lives in [place] would like to, see patient face-to-face prior to transition to residential hospice  Status is: Inpatient  Remains inpatient appropriate because: -Awaiting possible transfer to residential hospice  disposition: The patient is from: Home  Anticipated d/c is to:  Residential hospice  Anticipated d/c date is: to be determined   Family Communication:   Discussed with Daughter [name] at [phone] again on 10/21/19  Consultants:  GI--Rehman/palliative care and hospice  Code Status:  DNR  DVT  Prophylaxis:  SCDs (Gi Bleed)  Procedures: As Listed in Progress Note Above  Antibiotics: None  Subjective: - 10/21/19 ---Palliative care consult appreciated, patient also met with hospice team, anticipate transfer to residential hospice with left respiratory see of less than 8 weeks --Patient's daughter who lives in [place] and patient's son who lives in [place]  would like to, see patient face-to-face prior to transition to residential hospice   Objective: Vitals:   10/21/19 1030 10/21/19 1237 10/21/19 1630 10/21/19 1806  BP: 131/87 133/75 131/79 127/85  Pulse: (!) 112 (!) 102 91 (!) 128  Resp: _0 Temp: 98.1 F (36.7 C) 97.9 F (36.6 C) 98.2 F (36.8 C) 98.3 F (36.8 C)  TempSrc: Oral Oral Oral Oral  SpO2: 97% 97% 98% 96%  Weight:      Height:        Intake/Output Summary (Last 24 hours) at 10/21/2019 1834 Last data filed at 10/21/2019 1700 Gross per 24 hour  Intake 2000 ml  Output 750 ml  Net 1250 ml   Weight change:   Exam: General exam: Alert, awake, oriented x 3; obese, no conversational dyspnea Nose- [place] 3.5 L/min Respiratory system: No wheezing, few scattered rhonchi, fair air movement Cardiovascular system: Irregular irregular, tachy gastrointestinal system: Abdomen is nondistended, soft and without guarding. . Normal bowel sounds heard. Neuro=: Generalized weakness,No focal neurological deficits. Extremities: No cyanosis or clubbing, left arm PICC line in situ Skin: No rashes, no petechiae. Psychiatry: Judgement and insight appear normal.  Flat affect ,   No suicidal ideation or hallucination. GU-Foley catheter in situ   Data Reviewed: I have personally reviewed following labs and imaging studies  Basic Metabolic Panel: Recent Labs  Lab 10/15/19 0435 10/17/19 0847 10/18/19 0525 10/21/19 0851  NA 137 135 134* 133*  K 3.6 3.7 3.8 3.4*  CL 100 97* 96* 94*  CO2 _1 GLUCOSE 164* 178* 166* 167*  BUN _2 CREATININE 0.86 0.58* 0.62 0.61  CALCIUM 7.6* 7.5* 7.7* 7.8*  MG 1.8  --   --   --    Liver Function Tests: Recent Labs  Lab 10/21/19 0851  AST 21  ALT 43  ALKPHOS 137*  BILITOT 0.3  PROT 4.7*  ALBUMIN 1.9*   CBC: Recent Labs  Lab 10/15/19 0435 10/17/19 0847 10/18/19 0525 10/21/19 0851  WBC 17.0* 10.3 8.3 6.2  NEUTROABS  --  9.5*  --   --   HGB 8.3* 8.6* 9.2* 8.2*  HCT 26.5* 29.0* 30.6* 27.0*  MCV 81.5 83.1 82.7 82.1  PLT 86* 76* 96* 175   CBG: Recent Labs  Lab 10/20/19 1124 10/20/19 1750 10/21/19 0833 10/21/19 1112 10/21/19 1601  GLUCAP 216* 167* 145* 156* 162*   Urine analysis:    Component Value Date/Time   COLORURINE YELLOW 10/12/2019 1544   APPEARANCEUR HAZY (A) 10/12/2019 1544   LABSPEC 1.014 10/12/2019 1544   PHURINE 5.0 10/12/2019 1544   GLUCOSEU NEGATIVE 10/12/2019 1544   HGBUR MODERATE (A) 10/12/2019 1544   BILIRUBINUR NEGATIVE 10/12/2019 1544   KETONESUR NEGATIVE 10/12/2019 1544   PROTEINUR NEGATIVE 10/12/2019 1544   NITRITE NEGATIVE 10/12/2019 1544   LEUKOCYTESUR NEGATIVE 10/12/2019 1544   Sepsis Labs: Recent Results (from the past 240 hour(s))  Urine Culture     Status: Abnormal   Collection Time: 10/12/19  3:44 PM   Specimen: Urine, Random  Result Value Ref Range Status   Specimen Description   Final    URINE, RANDOM Performed at [facility], [address], [place], [place] [address]    Special Requests   Final    NONE Performed at [facility], [address] [place], [place] [address]    Culture >=100,000 COLONIES/mL ESCHERICHIA COLI (A)  Final   Report Status 10/14/2019 FINAL  Final  Organism ID, Bacteria ESCHERICHIA COLI (A)  Final      Susceptibility   Escherichia coli - MIC*    AMPICILLIN >=32 RESISTANT Resistant     CEFAZOLIN <=4 SENSITIVE Sensitive     CEFTRIAXONE <=0.25 SENSITIVE Sensitive     CIPROFLOXACIN <=0.25 SENSITIVE Sensitive     GENTAMICIN <=1 SENSITIVE Sensitive     IMIPENEM <=0.25 SENSITIVE Sensitive      NITROFURANTOIN <=16 SENSITIVE Sensitive     TRIMETH/SULFA <=20 SENSITIVE Sensitive     AMPICILLIN/SULBACTAM >=32 RESISTANT Resistant     PIP/TAZO <=4 SENSITIVE Sensitive     * >=100,000 COLONIES/mL ESCHERICHIA COLI  MRSA PCR Screening     Status: None   Collection Time: 10/13/19  4:08 AM   Specimen: Nasopharyngeal  Result Value Ref Range Status   MRSA by PCR NEGATIVE NEGATIVE Final    Comment:        The GeneXpert MRSA Assay (FDA approved for NASAL specimens only), is one component of a comprehensive MRSA colonization surveillance program. It is not intended to diagnose MRSA infection nor to guide or monitor treatment for MRSA infections. Performed at [facility], [address], [place], [place] [address]   Culture, blood (Routine X 2) w Reflex to ID Panel     Status: None   Collection Time: 10/13/19  6:36 PM   Specimen: BLOOD LEFT HAND  Result Value Ref Range Status   Specimen Description BLOOD LEFT HAND  Final   Special Requests   Final    BOTTLES DRAWN AEROBIC ONLY Blood Culture adequate volume   Culture   Final    NO GROWTH 6 DAYS Performed at [facility], [address], [place], [place] [address]    Report Status 10/19/2019 FINAL  Final  Culture, blood (Routine X 2) w Reflex to ID Panel     Status: None   Collection Time: 10/13/19  6:36 PM   Specimen: BLOOD LEFT HAND  Result Value Ref Range Status   Specimen Description BLOOD LEFT HAND  Final   Special Requests   Final    BOTTLES DRAWN AEROBIC AND ANAEROBIC Blood Culture adequate volume   Culture   Final    NO GROWTH 6 DAYS Performed at [facility], [address], [place], [place] [address]    Report Status 10/19/2019 FINAL  Final     Scheduled Meds: . (feeding supplement) PROSource Plus  30 mL Oral TID BM  . sodium chloride   Intravenous Once  . vitamin C  500 mg Oral Daily  . busPIRone  5 mg Oral TID  . Chlorhexidine Gluconate Cloth  6 each Topical Daily  . digoxin  0.25 mg Oral Daily  . [START ON  10/22/2019] diltiazem  240 mg Oral Daily  . hyoscyamine  0.25 mg Sublingual TID AC  . influenza vac split quadrivalent PF  0.5 mL Intramuscular Tomorrow-1000  . insulin aspart  0-20 Units Subcutaneous TID WC  . iron polysaccharides  150 mg Oral Daily  . mirtazapine  15 mg Oral QHS  . multivitamin-lutein  1 capsule Oral Daily  . pantoprazole  40 mg Oral BID AC  . sodium chloride flush  10-40 mL Intracatheter Q12H  . tamsulosin  0.4 mg Oral QPC supper  . zinc sulfate  220 mg Oral Daily   Continuous Infusions: . sodium chloride    . lactated ringers 20 mL/hr at 10/18/19 0000    Procedures/Studies: CT ANGIO CHEST PE W OR WO CONTRAST  Result Date: 10/03/2019 CLINICAL  DATA:  COVID-19 positive, weakness, dizziness and shortness of breath EXAM: CT ANGIOGRAPHY CHEST WITH CONTRAST TECHNIQUE: Multidetector CT imaging of the chest was performed using the standard protocol during bolus administration of intravenous contrast. Multiplanar CT image reconstructions and MIPs were obtained to evaluate the vascular anatomy. CONTRAST:  164m OMNIPAQUE IOHEXOL 350 MG/ML SOLN COMPARISON:  Radiograph 09/28/2019, CT abdomen pelvis 08/15/2018 FINDINGS: Cardiovascular: Satisfactory opacification the pulmonary arteries to the segmental level. No pulmonary artery filling defects are identified. Central pulmonary arteries are normal caliber. Suboptimal opacification of the thoracic aorta. No discernible acute abnormality of the thoracic aorta proximal great vessels. Aberrant right subclavian artery. Borderline cardiac enlargement. Slight reflux of contrast into the IVC. Mediastinum/Nodes: No mediastinal fluid or gas. Normal thyroid gland and thoracic inlet. No acute abnormality of the trachea or esophagus. No worrisome mediastinal, hilar or axillary adenopathy. Lungs/Pleura: Mixed areas of heterogeneous consolidative and ground-glass opacity throughout both lungs with a basilar and peripheral predominance. No pneumothorax or  visible effusion. Diffuse airways thickening. Upper Abdomen: Indeterminate intermediate attenuation (38 HU) lesion measuring up to 8.5 cm in maximum transaxial dimension within the right lobe liver (4/80). Cholelithiasis with a partially calcified gallstone towards the gallbladder neck. Multiple soft tissue attenuation lesions present along the lesser curvature/gastrohepatic ligament. Largest measuring up to 4.6 cm in size (4/91). Partially exophytic 0.7 cm lesion arising from the upper pole left kidney, corresponding with a previously seen fluid attenuation cyst on prior comparison CT, could reflect intracystic hemorrhage/proteinaceous debris. Musculoskeletal: Multilevel degenerative changes are present in the imaged portions of the spine. No acute osseous abnormality or suspicious osseous lesion. Probable intramuscular lipoma of the right infraspinatus measuring up to 6.9 by 3.5 cm (4/20). No other acute or worrisome chest wall lesions. Review of the MIP images confirms the above findings. IMPRESSION: 1. No evidence of acute pulmonary artery filling defects. 2. Mixed areas of heterogeneous consolidative and ground-glass opacity throughout both lungs with a basilar and peripheral predominance. Findings are compatible with multifocal pneumonia compatible with a COVID-19 etiology. 3. Indeterminate 4.8 cm lesion in the right lobe liver, incompletely characterized. Additional multiple soft tissue attenuation lesions along the lesser curvature/gastrohepatic ligament, largest measuring up to 4.6 cm in size. These findings are new from comparison abdominal CT 08/15/2018 and could raise concern for potential malignancy. Could consider further evaluation with abdominal MR with contrast. 4. Additional 7 mm hyperdense lesion arising from the upper pole left kidney, could reflect proteinaceous cysts, Ob also be better evaluated on dedicated abdominal imaging. 5. Cholelithiasis. 6. Aberrant right subclavian artery. These  results will be called to the ordering clinician or representative by the Radiologist Assistant, and communication documented in the PACS or [employer] Electronically Signed   By: PLovena LeM.D.   On: 10/03/2019 15:38   CT ABDOMEN PELVIS W CONTRAST  Result Date: 10/11/2019 CLINICAL DATA:  COVID positive. EXAM: CT ABDOMEN AND PELVIS WITH CONTRAST TECHNIQUE: Multidetector CT imaging of the abdomen and pelvis was performed using the standard protocol following bolus administration of intravenous contrast. CONTRAST:  745mOMNIPAQUE IOHEXOL 300 MG/ML  SOLN COMPARISON:  August 15, 2018 FINDINGS: Lower chest: Marked severity bilateral lower lobe infiltrates are seen. Hepatobiliary: Multiple large heterogeneous low-attenuation liver masses are seen. The largest measures approximately 6.0 cm x 5.3 cm x 6.1 cm. A 1.7 cm gallstone is seen within the neck of an otherwise normal-appearing gallbladder. Pancreas: Unremarkable. No pancreatic ductal dilatation or surrounding inflammatory changes. Spleen: Normal in size without focal abnormality. Adrenals/Urinary Tract: Adrenal glands  are unremarkable. Kidneys are normal in size, without renal calculi or hydronephrosis. A stable 1.2 cm diameter area of heterogeneous low attenuation is seen within the medial aspect of the mid to lower right kidney. A stable 1.2 cm cyst is seen along the posterior aspect of the upper pole of the left kidney. Bladder is unremarkable. Stomach/Bowel: A 3.0 cm x 8.7 cm x 6.3 cm heterogeneous soft tissue mass is seen extending from the wall of the lesser sac of the stomach into the gastric lumen (axial CT images 15 through 25, CT series number 2). Appendix appears normal. There is no evidence of bowel dilatation. Numerous diverticula are seen throughout the descending and sigmoid colon. Moderate severity thickening of the proximal sigmoid colon is noted (best seen on coronal reformatted images 43 through 50, CT series number 5).  Vascular/Lymphatic: No significant vascular findings are present. Reproductive: The prostate gland is markedly enlarged. Other: Multiple round heterogeneous soft tissue masses of various sizes are seen within the upper abdomen, adjacent to the lesser sac of the stomach. The largest measures approximately 5.2 cm x 5.6 cm x 4.6 cm. Musculoskeletal: Multilevel degenerative changes are seen throughout the lumbar spine. IMPRESSION: 1. Marked severity bilateral lower lobe infiltrates. 2. Multiple large heterogeneous low-attenuation liver masses, consistent with metastatic disease. 3. Large gastric mass, suspicious for primary neoplasm. 4. Multiple soft tissue masses within the mesentery of the upper abdomen, adjacent to the lesser sac of the stomach, consistent with metastatic disease. 5. Colonic diverticulosis. 6. Thickening of the proximal sigmoid colon which may represent sequelae associated with mild colitis/diverticulitis correlation with follow-up abdomen pelvis CT is recommended to exclude the presence of an underlying neoplasm. 7. Stable area of low attenuation within the right kidney. While this may represent a hemorrhagic cyst, correlation with renal ultrasound is recommended. 8. Cholelithiasis. Electronically Signed   By: Virgina Norfolk M.D.   On: 10/11/2019 23:24   MR LIVER W WO CONTRAST  Result Date: 10/04/2019 CLINICAL DATA:  Evaluate liver lesions and upper abdominal adenopathy seen on recent chest CT. EXAM: MRI ABDOMEN WITHOUT AND WITH CONTRAST TECHNIQUE: Multiplanar multisequence MR imaging of the abdomen was performed both before and after the administration of intravenous contrast. CONTRAST:  8m GADAVIST GADOBUTROL 1 MMOL/ML IV SOLN COMPARISON:  CT abdomen/pelvis 08/15/2018 FINDINGS: Lower chest: The lungs demonstrate changes of COVID pneumonia as demonstrated on today's chest CT. Hepatobiliary: Numerous diffusion positive hepatic metastatic lesions are noted. Segment 4A lesion measures 3.7 cm  on image 7/8. 5 cm lesion and segment 7 on image 12/8. 5 cm lesion in segment 6 on image 18/8. Several other smaller lesions are noted. No intrahepatic biliary dilatation. There is diffuse fatty infiltration of the liver noted. A 2 cm gallstone is noted in the gallbladder. No common bile duct dilatation. Pancreas:  No mass, inflammation or ductal dilatation. Spleen:  Normal size. No focal lesions. Adrenals/Urinary Tract: The adrenal glands and kidneys are unremarkable. Stomach/Bowel: There is a large infiltrating gastric mass involving the fundal region 6.5 x 4.8 cm and is diffusion positive. Moderate contrast enhancement is noted. Adjacent gastrohepatic ligament lymphadenopathy with the largest node measuring 4.6 cm. Vascular/Lymphatic: The aorta is normal in caliber. No dissection. The branch vessels are patent. No retroperitoneal lymphadenopathy. Other:  No ascites or abdominal wall hernia. Musculoskeletal: No worrisome bone lesions. IMPRESSION: 1. 6.5 x 4.8 cm infiltrating gastric mass involving the fundal region. Associated gastrohepatic ligament lymphadenopathy. 2. Numerous hepatic metastatic lesions. 3. Cholelithiasis. Electronically Signed   By: PMarijo Sanes  M.D.   On: 10/04/2019 06:26   US RENAL  Result Date: 10/13/2019 CLINICAL DATA:  Acute kidney injury, recent discovery of abdominal and liver masses. EXAM: RENAL / URINARY TRACT ULTRASOUND COMPLETE COMPARISON:  MRI 10/04/2019 and CT 10/11/2019 FINDINGS: Right Kidney: Renal measurements: 12.9 x 5.6 x 5.7 cm = volume: 213 mL. Echogenicity within normal limits. No mass or hydronephrosis visualized. Left Kidney: Renal measurements: 14 x 6.3 x 5.6 cm = volume: 258 mL. Echogenicity within normal limits. No mass or hydronephrosis visualized. Bladder: Foley catheter in the urinary bladder. Other: Incidental liver masses in the RIGHT hepatic lobe measuring 4.3 x 3.4 x 3.6 cm and 6.3 x 4.4 x 5.6 cm, grossly similar accounting for differences in angle of  measurement and technique as compared to recent imaging studies. One of these measurements may combine the dimensions of 2 adjacent masses, would refer to previous CT and MRI for follow-up measurements. IMPRESSION: 1. No hydronephrosis. 2. Hepatic metastatic disease better visualized on recent CTs. Electronically Signed   By: Zetta Bills M.D.   On: 10/13/2019 14:20   DG CHEST PORT 1 VIEW  Result Date: 10/13/2019 CLINICAL DATA:  History of COVID-19 positivity, status post left PICC line placement EXAM: PORTABLE CHEST 1 VIEW COMPARISON:  09/28/2019 FINDINGS: Cardiac shadow is stable. Patchy airspace opacities are noted in both lungs increased from the prior exam particularly in the left retrocardiac region consistent with progressive COVID-19 pneumonia. Left-sided PICC line is seen with the catheter tip at the cavoatrial junction. No bony abnormality is noted. IMPRESSION: PICC line in satisfactory position. Increase in the degree of bilateral patchy airspace opacity consistent with COVID-19 positivity. Electronically Signed   By: Inez Catalina M.D.   On: 10/13/2019 20:06   DG Chest Port 1 View  Result Date: 09/28/2019 CLINICAL DATA:  COVID EXAM: PORTABLE CHEST 1 VIEW COMPARISON:  09/24/2019 FINDINGS: Development of patchy bilateral airspace opacities. No pleural effusion. Stable cardiomediastinal silhouette. No pneumothorax. IMPRESSION: Development of patchy bilateral airspace opacities, consistent with bilateral pneumonia. Electronically Signed   By: Donavan Foil M.D.   On: 09/28/2019 23:14   DG Chest Port 1 View  Result Date: 09/24/2019 CLINICAL DATA:  COVID pneumonia, weakness, fatigue EXAM: PORTABLE CHEST 1 VIEW COMPARISON:  09/15/2019 FINDINGS: The lungs are symmetrically expanded. Minimal left basilar atelectasis is again noted. No superimposed confluent pulmonary infiltrate. No pneumothorax or pleural effusion. Cardiac size within normal limits. The pulmonary vascularity is normal. No acute bone  abnormality. IMPRESSION: Minimal left basilar atelectasis, unchanged. No superimposed confluent pulmonary infiltrate. Electronically Signed   By: Fidela Salisbury MD   On: 09/24/2019 20:15   ECHOCARDIOGRAM COMPLETE  Result Date: 09/30/2019    ECHOCARDIOGRAM REPORT   Patient Name:   Stephen Wilcox Date of Exam: 09/30/2019 Medical Rec #:  [id number]         Height:       72.0 in Accession #:    [id number]        Weight:       237.0 lb Date of Birth:  30-Jan-1955        BSA:          2.290 m Patient Age:    41 years          BP:           95/60 mmHg Patient Gender: M                 HR:  126 bpm. Exam Location:  [facility] Procedure: 2D Echo Indications:    Endocarditis I38  History:        Patient has no prior history of Echocardiogram examinations.                 Risk Factors:Non-Smoker. Pneumonia due to COVID-19 virus,                 Sepsis, Lactic Acidosis.  Sonographer:    Leavy Cella RDCS (AE) Referring Phys: [id number] OLADAPO ADEFESO IMPRESSIONS  1. Left ventricular ejection fraction, by estimation, is 55 to 60%. The left ventricle has normal function. The left ventricle has no regional wall motion abnormalities. There is moderate left ventricular hypertrophy. Left ventricular diastolic parameters are indeterminate.  2. Right ventricular systolic function is normal. The right ventricular size is normal. Tricuspid regurgitation signal is inadequate for assessing PA pressure.  3. The mitral valve is grossly normal. No evidence of mitral valve regurgitation.  4. The aortic valve is tricuspid. Aortic valve regurgitation is not visualized.  5. The inferior vena cava is normal in size with <50% respiratory variability, suggesting right atrial pressure of 8 mmHg.  6. Views are somewhat limited, but no definite valvular vegetations are visualized. FINDINGS  Left Ventricle: Left ventricular ejection fraction, by estimation, is 55 to 60%. The left ventricle has normal function. The left ventricle has no  regional wall motion abnormalities. The left ventricular internal cavity size was normal in size. There is  moderate left ventricular hypertrophy. Left ventricular diastolic parameters are indeterminate. Right Ventricle: The right ventricular size is normal. No increase in right ventricular wall thickness. Right ventricular systolic function is normal. Tricuspid regurgitation signal is inadequate for assessing PA pressure. Left Atrium: Left atrial size was normal in size. Right Atrium: Right atrial size was normal in size. Pericardium: There is no evidence of pericardial effusion. Mitral Valve: The mitral valve is grossly normal. No evidence of mitral valve regurgitation. Tricuspid Valve: The tricuspid valve is grossly normal. Tricuspid valve regurgitation is trivial. Aortic Valve: The aortic valve is tricuspid. Aortic valve regurgitation is not visualized. Pulmonic Valve: The pulmonic valve was grossly normal. Pulmonic valve regurgitation is trivial. Aorta: The aortic root is normal in size and structure. Venous: The inferior vena cava is normal in size with less than 50% respiratory variability, suggesting right atrial pressure of 8 mmHg. IAS/Shunts: No atrial level shunt detected by color flow Doppler.  LEFT VENTRICLE PLAX 2D LVIDd:         4.37 cm Diastology LVIDs:         2.20 cm LV e' medial:    7.18 cm/s LV PW:         1.72 cm LV E/e' medial:  9.2 LV IVS:        1.46 cm LV e' lateral:   12.90 cm/s                        LV E/e' lateral: 5.1  RIGHT VENTRICLE RV S prime:     16.00 cm/s TAPSE (M-mode): 1.5 cm LEFT ATRIUM             Index       RIGHT ATRIUM           Index LA diam:        3.70 cm 1.62 cm/m  RA Area:     15.40 cm LA Vol (A2C):   63.9 ml 27.90 ml/m RA Volume:  39.80 ml  17.38 ml/m LA Vol (A4C):   40.6 ml 17.73 ml/m LA Biplane Vol: 52.4 ml 22.88 ml/m   AORTA Ao Root diam: 3.50 cm MITRAL VALVE MV Area (PHT): 6.60 cm MV Decel Time: 115 msec MV E velocity: 66.00 cm/s MV A velocity: 39.80 cm/s  MV E/A ratio:  1.66 Rozann Lesches MD Electronically signed by Rozann Lesches MD Signature Date/Time: 09/30/2019/4:58:16 PM    Final    [place] EKG SITE RITE  Result Date: 10/13/2019 If Site Rite image not attached, placement could not be confirmed due to current cardiac rhythm.   Roxan Hockey, MD  [facility]  If 7PM-7AM, please contact night-coverage [email or web address]  10/21/2019, 6:34 PM   LOS: 23 days

## 2019-10-22 DIAGNOSIS — N179 Acute kidney failure, unspecified: Secondary | ICD-10-CM | POA: Diagnosis not present

## 2019-10-22 DIAGNOSIS — A419 Sepsis, unspecified organism: Secondary | ICD-10-CM | POA: Diagnosis not present

## 2019-10-22 DIAGNOSIS — U071 COVID-19: Secondary | ICD-10-CM | POA: Diagnosis not present

## 2019-10-22 DIAGNOSIS — I4891 Unspecified atrial fibrillation: Secondary | ICD-10-CM | POA: Diagnosis not present

## 2019-10-22 LAB — RENAL FUNCTION PANEL
Albumin: 1.9 g/dL — ABNORMAL LOW (ref 3.5–5.0)
Anion gap: 7 (ref 5–15)
BUN: 26 mg/dL — ABNORMAL HIGH (ref 8–23)
CO2: 30 mmol/L (ref 22–32)
Calcium: 7.6 mg/dL — ABNORMAL LOW (ref 8.9–10.3)
Chloride: 98 mmol/L (ref 98–111)
Creatinine, Ser: 0.54 mg/dL — ABNORMAL LOW (ref 0.61–1.24)
GFR calc Af Amer: >60 mL/min (ref >60)
GFR calc non Af Amer: >60 mL/min (ref >60)
Glucose, Bld: 166 mg/dL — ABNORMAL HIGH (ref 70–99)
Phosphorus: 2.7 mg/dL (ref 2.5–4.6)
Potassium: 4.5 mmol/L (ref 3.5–5.1)
Sodium: 135 mmol/L (ref 135–145)

## 2019-10-22 LAB — GLUCOSE, CAPILLARY
Glucose-Capillary: 146 mg/dL — ABNORMAL HIGH (ref 70–99)
Glucose-Capillary: 152 mg/dL — ABNORMAL HIGH (ref 70–99)
Glucose-Capillary: 154 mg/dL — ABNORMAL HIGH (ref 70–99)
Glucose-Capillary: 162 mg/dL — ABNORMAL HIGH (ref 70–99)
Glucose-Capillary: 181 mg/dL — ABNORMAL HIGH (ref 70–99)

## 2019-10-22 LAB — CBC
HCT: 27.1 % — ABNORMAL LOW (ref 39.0–52.0)
Hemoglobin: 8.1 g/dL — ABNORMAL LOW (ref 13.0–17.0)
MCH: 24.4 pg — ABNORMAL LOW (ref 26.0–34.0)
MCHC: 29.9 g/dL — ABNORMAL LOW (ref 30.0–36.0)
MCV: 81.6 fL (ref 80.0–100.0)
Platelets: 274 10*3/uL (ref 150–400)
RBC: 3.32 MIL/uL — ABNORMAL LOW (ref 4.22–5.81)
RDW: 21.3 % — ABNORMAL HIGH (ref 11.5–15.5)
WBC: 7.2 10*3/uL (ref 4.0–10.5)
nRBC: 0.3 % — ABNORMAL HIGH (ref 0.0–0.2)

## 2019-10-22 NOTE — Progress Notes (Signed)
PROGRESS NOTE  [name] VKP:[id number] DOB: September 22, 1955 DOA: 09/28/2019 PCP: Jake Samples, PA-C  Brief History:  64 year old male without any documented significant chronic medical problems presenting to the ED on 09/24/2019 with lightheadedness malaise, and some generalized weakness.  Patient was diagnosed with positive Covid.  He had declined monoclonal antibody infusion.  On 09/28/2019, the patient presented back to the emergency department with worsening generalized weakness, dizziness, and cough and malaise.  He was noted to have oxygen saturation of 82% room air and placed on 2 L nasal cannula.  Was subsequently started on remdesivir and IV steroids.  At the time of admission, the patient was noted to have a hemoglobin of 7.3 which was a significant drop when compared to 09/24/2019 when his hemoglobin was 9.1.  Hemoccult was noted to be positive during admission.  GI was consulted to assist with management.  Dr. Laural Golden saw the patient.  He did not feel that the patient had any overt active GI bleeding.  He recommended PPI infusion for 72 hours and subsequently transitioned to oral Protonix.  He recommended endoscopic evaluation if the patient's hemoglobin continued to drop again requiring transfusion.  The patient was transfused 2 units PRBC.  His hemoglobin went up to 9.3.  The patient's oxygen demand gradually increased to 6 L, but subsequently remained stable.  Blood cultures grew Staph epidermidis in 1 of 2 sets.  This was thought to be a contaminant.  Empiric antibiotics were discontinued.  On 09/29/2019, the patient was noted to have atrial fibrillation.  EKG confirmed atrial fibrillation.  The patient was started on oral diltiazem.  Echocardiogram on 09/30/2019 showed EF 55 to 60%, no WMA. ---- Okay to discontinue airborne isolation precautions as of 10/20/2019 -Patient anticipating transportation to residential hospice   Assessment/Plan: Severe Sepsis with Septic Shock  Secondary to E. coli infection with Acute Hypotension/Elevated Lactic Acid/Leukocytosis -In retrospect severe sepsis with septic shock was most likely NOT present on admission -However -on 10/13/19 Clinically patient met criteria for severe E. coli sepsis with hemodynamic instability as above -elevated WBC's, lactic acid and episode of hypoglycemia. -Hemodynamic instability resolved, patient weaned off IV phenylephrine pressure support on 10/14/2019 -Urine culture from 10/12/2019 with E. coli that is resistant to penicillins, okay to de-escalate from Vanco and cefepime to Rocephin (completed iv Rocephin) -Flagyl discontinued -Blood cultures from 10/13/2019 NGTD - negative MRSA PCR -Sepsis pathophysiology has resolved  Acute metabolic encephalopathy---resolved -In the setting of acute urinary retention and E Coli UTI/Sepsis -Patient mentation back to baseline and currently oriented x3.  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 Delton Coombes who will follow him up as an outpatient -Current nutritional and functional status precludes chemotherapy -Palliative care consult appreciated, patient also met with hospice team, anticipate  transfer to residential hospice with life expectancy of less than 8 weeks  Anorexia/FTT/adjustment disorder with depressed mood--  patient's appetite remains very very poor -Continue Remeron for appetite stimulation and sleep and mood -Dietitian recommendations appreciated; continue calorie counting. -If patient is unable to maintain 1800 -calorie/day intake on full liquid diet, -Not a candidate forr PEG tube placement due to adenocarcinoma of the stomach -Patient currently has left arm PICC line  Symptomatic Anemia/heme positive stool -Appreciate GI consult -Dr. Laural Golden followed the patient--> -Transfused 4 units PRBC during this hospitalization; last 2 units ordered on 10/13/2019 in the setting of acute drop in his hemoglobin count; part of these secondary to hemodilution after aggressive fluid resuscitation).. -in retrospect, gastric adenocarcinoma may have been source of bleed -Continue to follow hemoglobin trend and further transfuse as needed for hemoglobin less than 7. -Continue p.o. Protonix  New onset atrial fibrillation/Flutter---With persistent RVR -09/30/2019 echo EF 55 to 60%, no WMA -CHA2DS2-VASc = 1 -TSH--0.733 -Will continue holding on anticoagulation/aspirin in light of high risk for bleeding from gastric lesion. Continue digoxin and Cardizem for rate control -  Diabetes mellitus type 2 -09/28/2019 hemoglobin A1c 6.9 Use Novolog/Humalog Sliding scale insulin with Accu-Cheks/Fingersticks as ordered -Holding a nightly sliding scale insulin at this time.  Microcytic anemia/Iron deficiency -iron saturation 3% -ferritin low despite COVID-19 -Case discussed with Dr. Delton Coombes who has recommended initiation of Niferex -Continue transfusion as needed.  Class II obesity -BMI 34.14---this increases overall morbidity and mortality  Acute kidney injury -Renal ultrasound without hydronephrosis or obstructive uropathy -Foley catheter placed -Urine culture with E. coli  as above #1 -Renal function improved/normalized with IV fluids and treatment of E. coli UTI --Renally adjust medications, avoid nephrotoxic agents / dehydration  / hypotension -Creatinine has normalized (peaked at 2.0), baseline usually around 0.7  BPH/urinary retention -Continue Flomax -Foley removed on 10/16/2019, patient failed voiding trial and Foley was reinserted on 10/16/2019 -Renal ultrasound without obstructive uropathy or hydronephrosis  Bacteremia -Blood cultures from 09/28/2019 Staph epidermidis 1 out of 2 sets -Represents contaminant -Repeat blood cultures from 10/13/2019 NGTD  Generalized Weakness and Deconditioning -PT evaluation--> skilled nursing facility -Poor nutritional status, Covid infection, underlying gastric malignancy or contributing to patient's weakness and deconditioning -Anticipate transition to residential hospice  Social/Ethics---  -Extensive conversations with patient, palliative care provider and patient's daughter who lives in [place]--- current plan of care, underlying diagnosis, prognosis and goals of care discussed --he wishes to be a DNR/DNI --Palliative care consult appreciated, patient also met with hospice team, anticipate transfer to residential hospice with life expectancy of less than 8 weeks --Patient's daughter who lives in [place] and patient's son who lives in [place] would like to, see patient face-to-face prior to transition to residential hospice  Status is: Inpatient  Remains inpatient appropriate because: -Awaiting possible transfer to residential hospice  disposition: The patient is from: Home  Anticipated d/c is to:  Residential hospice  Anticipated d/c date is: to be determined   Family Communication:   Discussed with Daughter [name] at [phone] again on 10/21/19  Consultants:  GI--Rehman/palliative care and hospice  Code Status:  DNR  DVT Prophylaxis:  SCDs  (Gi Bleed)  Procedures: As Listed in Progress Note Above  Antibiotics: None  Subjective: - Denies any worsening shortness of breath, abdominal pain, nausea or vomiting.  Objective: Vitals:   10/21/19 2028 10/22/19 0617 10/22/19 0822 10/22/19 1400  BP: 118/72 128/87 117/73 116/69  Pulse: (!) 109 94 (!) 109 (!) 106  Resp:  $'20 18 18 18  'n$ Temp: 98.3 F (36.8 C) 97.6 F (36.4 C) 97.9 F (36.6 C) (!) 97.4 F (36.3 C)  TempSrc:  Oral Oral Oral  SpO2: 98% 95% 96% 97%  Weight:      Height:        Intake/Output Summary (Last 24 hours) at 10/22/2019 2029 Last data filed at 10/22/2019 1700 Gross per 24 hour  Intake 550.53 ml  Output 600 ml  Net -49.47 ml   Weight change:   Exam: General exam: Alert, awake, oriented x 3 Respiratory system: Clear to auscultation. Respiratory effort normal. Cardiovascular system:RRR. No murmurs, rubs, gallops. Gastrointestinal system: Abdomen is nondistended, soft and nontender. No organomegaly or masses felt. Normal bowel sounds heard. Central nervous system: Alert and oriented. No focal neurological deficits. Extremities: No C/C/E, +pedal pulses Skin: No rashes, lesions or ulcers Psychiatry: Judgement and insight appear normal. Mood & affect appropriate.    Data Reviewed: I have personally reviewed following labs and imaging studies  Basic Metabolic Panel: Recent Labs  Lab 10/17/19 0847 10/18/19 0525 10/21/19 0851 10/22/19 0710  NA 135 134* 133* 135  K 3.7 3.8 3.4* 4.5  CL 97* 96* 94* 98  CO2 $Re'31 31 31 30  'LzR$ GLUCOSE 178* 166* 167* 166*  BUN $Re'19 20 18 'huN$ 26*  CREATININE 0.58* 0.62 0.61 0.54*  CALCIUM 7.5* 7.7* 7.8* 7.6*  PHOS  --   --   --  2.7   Liver Function Tests: Recent Labs  Lab 10/21/19 0851 10/22/19 0710  AST 21  --   ALT 43  --   ALKPHOS 137*  --   BILITOT 0.3  --   PROT 4.7*  --   ALBUMIN 1.9* 1.9*   CBC: Recent Labs  Lab 10/17/19 0847 10/18/19 0525 10/21/19 0851 10/22/19 0710  WBC 10.3 8.3 6.2 7.2    NEUTROABS 9.5*  --   --   --   HGB 8.6* 9.2* 8.2* 8.1*  HCT 29.0* 30.6* 27.0* 27.1*  MCV 83.1 82.7 82.1 81.6  PLT 76* 96* 175 274   CBG: Recent Labs  Lab 10/21/19 2359 10/22/19 0756 10/22/19 1132 10/22/19 1632 10/22/19 2009  GLUCAP 152* 154* 181* 162* 146*   Urine analysis:    Component Value Date/Time   COLORURINE YELLOW 10/12/2019 1544   APPEARANCEUR HAZY (A) 10/12/2019 1544   LABSPEC 1.014 10/12/2019 1544   PHURINE 5.0 10/12/2019 1544   GLUCOSEU NEGATIVE 10/12/2019 1544   HGBUR MODERATE (A) 10/12/2019 1544   BILIRUBINUR NEGATIVE 10/12/2019 1544   KETONESUR NEGATIVE 10/12/2019 1544   PROTEINUR NEGATIVE 10/12/2019 1544   NITRITE NEGATIVE 10/12/2019 1544   LEUKOCYTESUR NEGATIVE 10/12/2019 1544   Sepsis Labs: Recent Results (from the past 240 hour(s))  MRSA PCR Screening     Status: None   Collection Time: 10/13/19  4:08 AM   Specimen: Nasopharyngeal  Result Value Ref Range Status   MRSA by PCR NEGATIVE NEGATIVE Final    Comment:        The GeneXpert MRSA Assay (FDA approved for NASAL specimens only), is one component of a comprehensive MRSA colonization surveillance program. It is not intended to diagnose MRSA infection nor to guide or monitor treatment for MRSA infections. Performed at [facility], [address], [place], [place] [address]   Culture, blood (Routine X 2) w Reflex to ID Panel     Status: None   Collection Time: 10/13/19  6:36 PM   Specimen: BLOOD LEFT HAND  Result Value Ref Range Status  Specimen Description BLOOD LEFT HAND  Final   Special Requests   Final    BOTTLES DRAWN AEROBIC ONLY Blood Culture adequate volume   Culture   Final    NO GROWTH 6 DAYS Performed at [facility], [address], [place], [place] [address]    Report Status 10/19/2019 FINAL  Final  Culture, blood (Routine X 2) w Reflex to ID Panel     Status: None   Collection Time: 10/13/19  6:36 PM   Specimen: BLOOD LEFT HAND  Result Value Ref Range Status    Specimen Description BLOOD LEFT HAND  Final   Special Requests   Final    BOTTLES DRAWN AEROBIC AND ANAEROBIC Blood Culture adequate volume   Culture   Final    NO GROWTH 6 DAYS Performed at [facility], [address], [place],  [address]    Report Status 10/19/2019 FINAL  Final     Scheduled Meds: . (feeding supplement) PROSource Plus  30 mL Oral TID BM  . sodium chloride   Intravenous Once  . vitamin C  500 mg Oral Daily  . busPIRone  5 mg Oral TID  . Chlorhexidine Gluconate Cloth  6 each Topical Daily  . digoxin  0.125 mg Oral Daily  . diltiazem  240 mg Oral Daily  . hyoscyamine  0.25 mg Sublingual TID AC  . influenza vac split quadrivalent PF  0.5 mL Intramuscular Tomorrow-1000  . insulin aspart  0-20 Units Subcutaneous TID WC  . iron polysaccharides  150 mg Oral Daily  . mirtazapine  15 mg Oral QHS  . multivitamin-lutein  1 capsule Oral Daily  . pantoprazole  40 mg Oral BID AC  . sodium chloride flush  10-40 mL Intracatheter Q12H  . tamsulosin  0.4 mg Oral QPC supper  . zinc sulfate  220 mg Oral Daily   Continuous Infusions: . sodium chloride 250 mL (10/21/19 2156)  . lactated ringers 20 mL/hr at 10/18/19 0000    Procedures/Studies: CT ANGIO CHEST PE W OR WO CONTRAST  Result Date: 10/03/2019 CLINICAL DATA:  COVID-19 positive, weakness, dizziness and shortness of breath EXAM: CT ANGIOGRAPHY CHEST WITH CONTRAST TECHNIQUE: Multidetector CT imaging of the chest was performed using the standard protocol during bolus administration of intravenous contrast. Multiplanar CT image reconstructions and MIPs were obtained to evaluate the vascular anatomy. CONTRAST:  169mL OMNIPAQUE IOHEXOL 350 MG/ML SOLN COMPARISON:  Radiograph 09/28/2019, CT abdomen pelvis 08/15/2018 FINDINGS: Cardiovascular: Satisfactory opacification the pulmonary arteries to the segmental level. No pulmonary artery filling defects are identified. Central pulmonary arteries are normal caliber. Suboptimal  opacification of the thoracic aorta. No discernible acute abnormality of the thoracic aorta proximal great vessels. Aberrant right subclavian artery. Borderline cardiac enlargement. Slight reflux of contrast into the IVC. Mediastinum/Nodes: No mediastinal fluid or gas. Normal thyroid gland and thoracic inlet. No acute abnormality of the trachea or esophagus. No worrisome mediastinal, hilar or axillary adenopathy. Lungs/Pleura: Mixed areas of heterogeneous consolidative and ground-glass opacity throughout both lungs with a basilar and peripheral predominance. No pneumothorax or visible effusion. Diffuse airways thickening. Upper Abdomen: Indeterminate intermediate attenuation (38 HU) lesion measuring up to 8.5 cm in maximum transaxial dimension within the right lobe liver (4/80). Cholelithiasis with a partially calcified gallstone towards the gallbladder neck. Multiple soft tissue attenuation lesions present along the lesser curvature/gastrohepatic ligament. Largest measuring up to 4.6 cm in size (4/91). Partially exophytic 0.7 cm lesion arising from the upper pole left kidney, corresponding with a previously seen fluid attenuation  cyst on prior comparison CT, could reflect intracystic hemorrhage/proteinaceous debris. Musculoskeletal: Multilevel degenerative changes are present in the imaged portions of the spine. No acute osseous abnormality or suspicious osseous lesion. Probable intramuscular lipoma of the right infraspinatus measuring up to 6.9 by 3.5 cm (4/20). No other acute or worrisome chest wall lesions. Review of the MIP images confirms the above findings. IMPRESSION: 1. No evidence of acute pulmonary artery filling defects. 2. Mixed areas of heterogeneous consolidative and ground-glass opacity throughout both lungs with a basilar and peripheral predominance. Findings are compatible with multifocal pneumonia compatible with a COVID-19 etiology. 3. Indeterminate 4.8 cm lesion in the right lobe liver,  incompletely characterized. Additional multiple soft tissue attenuation lesions along the lesser curvature/gastrohepatic ligament, largest measuring up to 4.6 cm in size. These findings are new from comparison abdominal CT 08/15/2018 and could raise concern for potential malignancy. Could consider further evaluation with abdominal MR with contrast. 4. Additional 7 mm hyperdense lesion arising from the upper pole left kidney, could reflect proteinaceous cysts, Ob also be better evaluated on dedicated abdominal imaging. 5. Cholelithiasis. 6. Aberrant right subclavian artery. These results will be called to the ordering clinician or representative by the Radiologist Assistant, and communication documented in the PACS or [employer]. Electronically Signed   By: Kreg Shropshire M.D.   On: 10/03/2019 15:38   CT ABDOMEN PELVIS W CONTRAST  Result Date: 10/11/2019 CLINICAL DATA:  COVID positive. EXAM: CT ABDOMEN AND PELVIS WITH CONTRAST TECHNIQUE: Multidetector CT imaging of the abdomen and pelvis was performed using the standard protocol following bolus administration of intravenous contrast. CONTRAST:  47mL OMNIPAQUE IOHEXOL 300 MG/ML  SOLN COMPARISON:  August 15, 2018 FINDINGS: Lower chest: Marked severity bilateral lower lobe infiltrates are seen. Hepatobiliary: Multiple large heterogeneous low-attenuation liver masses are seen. The largest measures approximately 6.0 cm x 5.3 cm x 6.1 cm. A 1.7 cm gallstone is seen within the neck of an otherwise normal-appearing gallbladder. Pancreas: Unremarkable. No pancreatic ductal dilatation or surrounding inflammatory changes. Spleen: Normal in size without focal abnormality. Adrenals/Urinary Tract: Adrenal glands are unremarkable. Kidneys are normal in size, without renal calculi or hydronephrosis. A stable 1.2 cm diameter area of heterogeneous low attenuation is seen within the medial aspect of the mid to lower right kidney. A stable 1.2 cm cyst is seen along the posterior  aspect of the upper pole of the left kidney. Bladder is unremarkable. Stomach/Bowel: A 3.0 cm x 8.7 cm x 6.3 cm heterogeneous soft tissue mass is seen extending from the wall of the lesser sac of the stomach into the gastric lumen (axial CT images 15 through 25, CT series number 2). Appendix appears normal. There is no evidence of bowel dilatation. Numerous diverticula are seen throughout the descending and sigmoid colon. Moderate severity thickening of the proximal sigmoid colon is noted (best seen on coronal reformatted images 43 through 50, CT series number 5). Vascular/Lymphatic: No significant vascular findings are present. Reproductive: The prostate gland is markedly enlarged. Other: Multiple round heterogeneous soft tissue masses of various sizes are seen within the upper abdomen, adjacent to the lesser sac of the stomach. The largest measures approximately 5.2 cm x 5.6 cm x 4.6 cm. Musculoskeletal: Multilevel degenerative changes are seen throughout the lumbar spine. IMPRESSION: 1. Marked severity bilateral lower lobe infiltrates. 2. Multiple large heterogeneous low-attenuation liver masses, consistent with metastatic disease. 3. Large gastric mass, suspicious for primary neoplasm. 4. Multiple soft tissue masses within the mesentery of the upper abdomen, adjacent to the lesser  sac of the stomach, consistent with metastatic disease. 5. Colonic diverticulosis. 6. Thickening of the proximal sigmoid colon which may represent sequelae associated with mild colitis/diverticulitis correlation with follow-up abdomen pelvis CT is recommended to exclude the presence of an underlying neoplasm. 7. Stable area of low attenuation within the right kidney. While this may represent a hemorrhagic cyst, correlation with renal ultrasound is recommended. 8. Cholelithiasis. Electronically Signed   By: Aram Candela M.D.   On: 10/11/2019 23:24   MR LIVER W WO CONTRAST  Result Date: 10/04/2019 CLINICAL DATA:  Evaluate liver  lesions and upper abdominal adenopathy seen on recent chest CT. EXAM: MRI ABDOMEN WITHOUT AND WITH CONTRAST TECHNIQUE: Multiplanar multisequence MR imaging of the abdomen was performed both before and after the administration of intravenous contrast. CONTRAST:  21mL GADAVIST GADOBUTROL 1 MMOL/ML IV SOLN COMPARISON:  CT abdomen/pelvis 08/15/2018 FINDINGS: Lower chest: The lungs demonstrate changes of COVID pneumonia as demonstrated on today's chest CT. Hepatobiliary: Numerous diffusion positive hepatic metastatic lesions are noted. Segment 4A lesion measures 3.7 cm on image 7/8. 5 cm lesion and segment 7 on image 12/8. 5 cm lesion in segment 6 on image 18/8. Several other smaller lesions are noted. No intrahepatic biliary dilatation. There is diffuse fatty infiltration of the liver noted. A 2 cm gallstone is noted in the gallbladder. No common bile duct dilatation. Pancreas:  No mass, inflammation or ductal dilatation. Spleen:  Normal size. No focal lesions. Adrenals/Urinary Tract: The adrenal glands and kidneys are unremarkable. Stomach/Bowel: There is a large infiltrating gastric mass involving the fundal region 6.5 x 4.8 cm and is diffusion positive. Moderate contrast enhancement is noted. Adjacent gastrohepatic ligament lymphadenopathy with the largest node measuring 4.6 cm. Vascular/Lymphatic: The aorta is normal in caliber. No dissection. The branch vessels are patent. No retroperitoneal lymphadenopathy. Other:  No ascites or abdominal wall hernia. Musculoskeletal: No worrisome bone lesions. IMPRESSION: 1. 6.5 x 4.8 cm infiltrating gastric mass involving the fundal region. Associated gastrohepatic ligament lymphadenopathy. 2. Numerous hepatic metastatic lesions. 3. Cholelithiasis. Electronically Signed   By: Rudie Meyer M.D.   On: 10/04/2019 06:26   US RENAL  Result Date: 10/13/2019 CLINICAL DATA:  Acute kidney injury, recent discovery of abdominal and liver masses. EXAM: RENAL / URINARY TRACT ULTRASOUND  COMPLETE COMPARISON:  MRI 10/04/2019 and CT 10/11/2019 FINDINGS: Right Kidney: Renal measurements: 12.9 x 5.6 x 5.7 cm = volume: 213 mL. Echogenicity within normal limits. No mass or hydronephrosis visualized. Left Kidney: Renal measurements: 14 x 6.3 x 5.6 cm = volume: 258 mL. Echogenicity within normal limits. No mass or hydronephrosis visualized. Bladder: Foley catheter in the urinary bladder. Other: Incidental liver masses in the RIGHT hepatic lobe measuring 4.3 x 3.4 x 3.6 cm and 6.3 x 4.4 x 5.6 cm, grossly similar accounting for differences in angle of measurement and technique as compared to recent imaging studies. One of these measurements may combine the dimensions of 2 adjacent masses, would refer to previous CT and MRI for follow-up measurements. IMPRESSION: 1. No hydronephrosis. 2. Hepatic metastatic disease better visualized on recent CTs. Electronically Signed   By: Donzetta Kohut M.D.   On: 10/13/2019 14:20   DG CHEST PORT 1 VIEW  Result Date: 10/13/2019 CLINICAL DATA:  History of COVID-19 positivity, status post left PICC line placement EXAM: PORTABLE CHEST 1 VIEW COMPARISON:  09/28/2019 FINDINGS: Cardiac shadow is stable. Patchy airspace opacities are noted in both lungs increased from the prior exam particularly in the left retrocardiac region consistent with progressive COVID-19  pneumonia. Left-sided PICC line is seen with the catheter tip at the cavoatrial junction. No bony abnormality is noted. IMPRESSION: PICC line in satisfactory position. Increase in the degree of bilateral patchy airspace opacity consistent with COVID-19 positivity. Electronically Signed   By: Inez Catalina M.D.   On: 10/13/2019 20:06   DG Chest Port 1 View  Result Date: 09/28/2019 CLINICAL DATA:  COVID EXAM: PORTABLE CHEST 1 VIEW COMPARISON:  09/24/2019 FINDINGS: Development of patchy bilateral airspace opacities. No pleural effusion. Stable cardiomediastinal silhouette. No pneumothorax. IMPRESSION: Development of  patchy bilateral airspace opacities, consistent with bilateral pneumonia. Electronically Signed   By: Donavan Foil M.D.   On: 09/28/2019 23:14   DG Chest Port 1 View  Result Date: 09/24/2019 CLINICAL DATA:  COVID pneumonia, weakness, fatigue EXAM: PORTABLE CHEST 1 VIEW COMPARISON:  09/15/2019 FINDINGS: The lungs are symmetrically expanded. Minimal left basilar atelectasis is again noted. No superimposed confluent pulmonary infiltrate. No pneumothorax or pleural effusion. Cardiac size within normal limits. The pulmonary vascularity is normal. No acute bone abnormality. IMPRESSION: Minimal left basilar atelectasis, unchanged. No superimposed confluent pulmonary infiltrate. Electronically Signed   By: Fidela Salisbury MD   On: 09/24/2019 20:15   ECHOCARDIOGRAM COMPLETE  Result Date: 09/30/2019    ECHOCARDIOGRAM REPORT   Patient Name:   Stephen Wilcox Date of Exam: 09/30/2019 Medical Rec #:  [id number]         Height:       72.0 in Accession #:    [id number]        Weight:       237.0 lb Date of Birth:  [birth year]        BSA:          2.290 m Patient Age:    89 years          BP:           95/60 mmHg Patient Gender: M                 HR:           126 bpm. Exam Location:  [facility] Procedure: 2D Echo Indications:    Endocarditis I38  History:        Patient has no prior history of Echocardiogram examinations.                 Risk Factors:Non-Smoker. Pneumonia due to COVID-19 virus,                 Sepsis, Lactic Acidosis.  Sonographer:    Leavy Cella RDCS (AE) Referring Phys: [id number] OLADAPO ADEFESO IMPRESSIONS  1. Left ventricular ejection fraction, by estimation, is 55 to 60%. The left ventricle has normal function. The left ventricle has no regional wall motion abnormalities. There is moderate left ventricular hypertrophy. Left ventricular diastolic parameters are indeterminate.  2. Right ventricular systolic function is normal. The right ventricular size is normal. Tricuspid regurgitation signal is  inadequate for assessing PA pressure.  3. The mitral valve is grossly normal. No evidence of mitral valve regurgitation.  4. The aortic valve is tricuspid. Aortic valve regurgitation is not visualized.  5. The inferior vena cava is normal in size with <50% respiratory variability, suggesting right atrial pressure of 8 mmHg.  6. Views are somewhat limited, but no definite valvular vegetations are visualized. FINDINGS  Left Ventricle: Left ventricular ejection fraction, by estimation, is 55 to 60%. The left ventricle has normal function. The left ventricle has no regional wall motion abnormalities.  The left ventricular internal cavity size was normal in size. There is  moderate left ventricular hypertrophy. Left ventricular diastolic parameters are indeterminate. Right Ventricle: The right ventricular size is normal. No increase in right ventricular wall thickness. Right ventricular systolic function is normal. Tricuspid regurgitation signal is inadequate for assessing PA pressure. Left Atrium: Left atrial size was normal in size. Right Atrium: Right atrial size was normal in size. Pericardium: There is no evidence of pericardial effusion. Mitral Valve: The mitral valve is grossly normal. No evidence of mitral valve regurgitation. Tricuspid Valve: The tricuspid valve is grossly normal. Tricuspid valve regurgitation is trivial. Aortic Valve: The aortic valve is tricuspid. Aortic valve regurgitation is not visualized. Pulmonic Valve: The pulmonic valve was grossly normal. Pulmonic valve regurgitation is trivial. Aorta: The aortic root is normal in size and structure. Venous: The inferior vena cava is normal in size with less than 50% respiratory variability, suggesting right atrial pressure of 8 mmHg. IAS/Shunts: No atrial level shunt detected by color flow Doppler.  LEFT VENTRICLE PLAX 2D LVIDd:         4.37 cm Diastology LVIDs:         2.20 cm LV e' medial:    7.18 cm/s LV PW:         1.72 cm LV E/e' medial:  9.2 LV  IVS:        1.46 cm LV e' lateral:   12.90 cm/s                        LV E/e' lateral: 5.1  RIGHT VENTRICLE RV S prime:     16.00 cm/s TAPSE (M-mode): 1.5 cm LEFT ATRIUM             Index       RIGHT ATRIUM           Index LA diam:        3.70 cm 1.62 cm/m  RA Area:     15.40 cm LA Vol (A2C):   63.9 ml 27.90 ml/m RA Volume:   39.80 ml  17.38 ml/m LA Vol (A4C):   40.6 ml 17.73 ml/m LA Biplane Vol: 52.4 ml 22.88 ml/m   AORTA Ao Root diam: 3.50 cm MITRAL VALVE MV Area (PHT): 6.60 cm MV Decel Time: 115 msec MV E velocity: 66.00 cm/s MV A velocity: 39.80 cm/s MV E/A ratio:  1.66 Rozann Lesches MD Electronically signed by Rozann Lesches MD Signature Date/Time: 09/30/2019/4:58:16 PM    Final    [place] EKG SITE RITE  Result Date: 10/13/2019 If Site Rite image not attached, placement could not be confirmed due to current cardiac rhythm.   Kathie Dike, MD  [facility]  If 7PM-7AM, please contact night-coverage [email or web address]  10/22/2019, 8:29 PM   LOS: 24 days

## 2019-10-23 DIAGNOSIS — I4891 Unspecified atrial fibrillation: Secondary | ICD-10-CM | POA: Diagnosis not present

## 2019-10-23 DIAGNOSIS — U071 COVID-19: Secondary | ICD-10-CM | POA: Diagnosis not present

## 2019-10-23 DIAGNOSIS — A419 Sepsis, unspecified organism: Secondary | ICD-10-CM | POA: Diagnosis not present

## 2019-10-23 DIAGNOSIS — N179 Acute kidney failure, unspecified: Secondary | ICD-10-CM | POA: Diagnosis not present

## 2019-10-23 LAB — GLUCOSE, CAPILLARY
Glucose-Capillary: 174 mg/dL — ABNORMAL HIGH (ref 70–99)
Glucose-Capillary: 192 mg/dL — ABNORMAL HIGH (ref 70–99)
Glucose-Capillary: 182 mg/dL — ABNORMAL HIGH (ref 70–99)

## 2019-10-23 MED ORDER — ALBUMIN HUMAN 25 % IV SOLN
25.0000 g | Freq: Two times a day (BID) | INTRAVENOUS | Status: DC
  Administered 2019-10-23 – 2019-10-24 (×3): 25 g via INTRAVENOUS
  Filled 2019-10-23 (×3): qty 100

## 2019-10-23 MED ORDER — FUROSEMIDE 10 MG/ML IJ SOLN
20.0000 mg | Freq: Two times a day (BID) | INTRAMUSCULAR | Status: DC
  Administered 2019-10-23 – 2019-10-24 (×3): 20 mg via INTRAVENOUS
  Filled 2019-10-23 (×3): qty 2

## 2019-10-23 MED ORDER — METOPROLOL TARTRATE 25 MG PO TABS
12.5000 mg | ORAL_TABLET | Freq: Two times a day (BID) | ORAL | Status: DC
  Administered 2019-10-23 – 2019-10-25 (×5): 12.5 mg via ORAL
  Filled 2019-10-23 (×6): qty 1

## 2019-10-23 NOTE — Progress Notes (Signed)
PROGRESS NOTE  [name] VKP:[id number] DOB: September 22, 1955 DOA: 09/28/2019 PCP: Jake Samples, PA-C  Brief History:  64 year old male without any documented significant chronic medical problems presenting to the ED on 09/24/2019 with lightheadedness malaise, and some generalized weakness.  Patient was diagnosed with positive Covid.  He had declined monoclonal antibody infusion.  On 09/28/2019, the patient presented back to the emergency department with worsening generalized weakness, dizziness, and cough and malaise.  He was noted to have oxygen saturation of 82% room air and placed on 2 L nasal cannula.  Was subsequently started on remdesivir and IV steroids.  At the time of admission, the patient was noted to have a hemoglobin of 7.3 which was a significant drop when compared to 09/24/2019 when his hemoglobin was 9.1.  Hemoccult was noted to be positive during admission.  GI was consulted to assist with management.  Dr. Laural Golden saw the patient.  He did not feel that the patient had any overt active GI bleeding.  He recommended PPI infusion for 72 hours and subsequently transitioned to oral Protonix.  He recommended endoscopic evaluation if the patient's hemoglobin continued to drop again requiring transfusion.  The patient was transfused 2 units PRBC.  His hemoglobin went up to 9.3.  The patient's oxygen demand gradually increased to 6 L, but subsequently remained stable.  Blood cultures grew Staph epidermidis in 1 of 2 sets.  This was thought to be a contaminant.  Empiric antibiotics were discontinued.  On 09/29/2019, the patient was noted to have atrial fibrillation.  EKG confirmed atrial fibrillation.  The patient was started on oral diltiazem.  Echocardiogram on 09/30/2019 showed EF 55 to 60%, no WMA. ---- Okay to discontinue airborne isolation precautions as of 10/20/2019 -Patient anticipating transportation to residential hospice   Assessment/Plan: Severe Sepsis with Septic Shock  Secondary to E. coli infection with Acute Hypotension/Elevated Lactic Acid/Leukocytosis -In retrospect severe sepsis with septic shock was most likely NOT present on admission -However -on 10/13/19 Clinically patient met criteria for severe E. coli sepsis with hemodynamic instability as above -elevated WBC's, lactic acid and episode of hypoglycemia. -Hemodynamic instability resolved, patient weaned off IV phenylephrine pressure support on 10/14/2019 -Urine culture from 10/12/2019 with E. coli that is resistant to penicillins, okay to de-escalate from Vanco and cefepime to Rocephin (completed iv Rocephin) -Flagyl discontinued -Blood cultures from 10/13/2019 NGTD - negative MRSA PCR -Sepsis pathophysiology has resolved  Acute metabolic encephalopathy---resolved -In the setting of acute urinary retention and E Coli UTI/Sepsis -Patient mentation back to baseline and currently oriented x3.  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 Delton Coombes who will follow him up as an outpatient -Current nutritional and functional status precludes chemotherapy -Palliative care consult appreciated, patient also met with hospice team, anticipate  transfer to residential hospice with life expectancy of less than 6 weeks  Anorexia/FTT/adjustment disorder with depressed mood--  patient's appetite remains very very poor -Continue Remeron for appetite stimulation and sleep and mood -Dietitian recommendations appreciated; continue calorie counting. -If patient is unable to maintain 1800 -calorie/day intake on full liquid diet, -Not a candidate forr PEG tube placement due to adenocarcinoma of the stomach -Patient currently has left arm PICC line  Symptomatic Anemia/heme positive stool -Appreciate GI consult -Dr. Laural Golden followed the patient--> -Transfused 4 units PRBC during this hospitalization; last 2 units ordered on 10/13/2019 in the setting of acute drop in his hemoglobin count; part of these secondary to hemodilution after aggressive fluid resuscitation).. -in retrospect, gastric adenocarcinoma may have been source of bleed -Continue to follow hemoglobin trend and further transfuse as needed for hemoglobin less than 7. -Continue p.o. Protonix  New onset atrial fibrillation/Flutter---With persistent RVR -09/30/2019 echo EF 55 to 60%, no WMA -CHA2DS2-VASc = 1 -TSH--0.733 -Will continue holding on anticoagulation/aspirin in light of high risk for bleeding from gastric lesion. Continue digoxin and Cardizem for rate control -Started on low-dose Lopressor for tachycardia   Diabetes mellitus type 2 -09/28/2019 hemoglobin A1c 6.9 Use Novolog/Humalog Sliding scale insulin with Accu-Cheks/Fingersticks as ordered -Holding a nightly sliding scale insulin at this time.  Microcytic anemia/Iron deficiency -iron saturation 3% -ferritin low despite COVID-19 -Case discussed with Dr. Delton Coombes who has recommended initiation of Niferex -Continue transfusion as needed.  Class II obesity -BMI 34.14---this increases overall morbidity and mortality  Acute kidney injury -Renal ultrasound without hydronephrosis or obstructive uropathy -Foley  catheter placed -Urine culture with E. coli as above #1 -Renal function improved/normalized with IV fluids and treatment of E. coli UTI --Renally adjust medications, avoid nephrotoxic agents / dehydration  / hypotension -Creatinine has normalized (peaked at 2.0), baseline usually around 0.7  BPH/urinary retention -Continue Flomax -Foley removed on 10/16/2019, patient failed voiding trial and Foley was reinserted on 10/16/2019 -Renal ultrasound without obstructive uropathy or hydronephrosis  Bacteremia -Blood cultures from 09/28/2019 Staph epidermidis 1 out of 2 sets -Represents contaminant -Repeat blood cultures from 10/13/2019 NGTD  Generalized Weakness and Deconditioning -PT evaluation--> skilled nursing facility -Poor nutritional status, Covid infection, underlying gastric malignancy or contributing to patient's weakness and deconditioning -Anticipate transition to residential hospice  Anasarca -Likely related to hypoalbuminemia -Started on albumin infusions and intravenous Lasix  Social/Ethics---  -Extensive conversations with patient, palliative care provider and patient's daughter who lives in [place]--- current plan of care, underlying diagnosis, prognosis and goals of care discussed --he wishes to be a DNR/DNI --Palliative care consult appreciated, patient also met with hospice team, anticipate transfer to residential hospice with life expectancy of less than 6 weeks --Patient's daughter who lives in [place] and patient's son who lives in [place] would like to, see patient face-to-face prior to transition to residential hospice  Status is: Inpatient  Remains inpatient appropriate because: -Awaiting possible transfer to residential hospice  disposition: The patient is from: Home  Anticipated d/c is to:  Residential hospice  Anticipated d/c date is: to be determined   Family Communication:   Discussed with Daughter [name] at [phone] again on 10/23/19  Consultants:  GI--Rehman/palliative care and hospice  Code Status:  DNR  DVT Prophylaxis:  SCDs (Gi Bleed)  Procedures: As Listed in Progress Note Above  Antibiotics: None  Subjective: Had small bowel movements earlier this morning.  No nausea or vomiting.  Denies any worsening shortness of breath.  Objective: Vitals:  10/23/19 0806 10/23/19 0813 10/23/19 0830 10/23/19 1500  BP: 107/78   132/78  Pulse:  (!) 119 (!) 109   Resp:    18  Temp:    98 F (36.7 C)  TempSrc:    Oral  SpO2:    95%  Weight:      Height:        Intake/Output Summary (Last 24 hours) at 10/23/2019 1908 Last data filed at 10/23/2019 1856 Gross per 24 hour  Intake 480 ml  Output 1900 ml  Net -1420 ml   Weight change:   Exam: General exam: Alert, awake, oriented x 3 Respiratory system: Clear breath sounds bilaterally.  Mild increased respiratory effort Cardiovascular system:RRR. No murmurs, rubs, gallops. Gastrointestinal system: Abdomen is nondistended, soft and nontender. No organomegaly or masses felt. Normal bowel sounds heard. Central nervous system: Alert and oriented. No focal neurological deficits. Extremities: No C/C/E, +pedal pulses Skin: No rashes, lesions or ulcers Psychiatry: Judgement and insight appear normal. Mood & affect appropriate.     Data Reviewed: I have personally reviewed following labs and imaging studies  Basic Metabolic Panel: Recent Labs  Lab 10/17/19 0847 10/18/19 0525 10/21/19 0851 10/22/19 0710  NA 135 134* 133* 135  K 3.7 3.8 3.4* 4.5  CL 97* 96* 94* 98  CO2 _0 GLUCOSE 178* 166* 167* 166*  BUN _1 26*  CREATININE 0.58* 0.62 0.61 0.54*  CALCIUM 7.5* 7.7* 7.8* 7.6*  PHOS  --   --   --  2.7   Liver Function Tests: Recent Labs  Lab 10/21/19 0851 10/22/19 0710  AST 21  --   ALT 43  --   ALKPHOS 137*  --   BILITOT 0.3  --   PROT 4.7*  --   ALBUMIN 1.9* 1.9*   CBC: Recent Labs   Lab 10/17/19 0847 10/18/19 0525 10/21/19 0851 10/22/19 0710  WBC 10.3 8.3 6.2 7.2  NEUTROABS 9.5*  --   --   --   HGB 8.6* 9.2* 8.2* 8.1*  HCT 29.0* 30.6* 27.0* 27.1*  MCV 83.1 82.7 82.1 81.6  PLT 76* 96* 175 274   CBG: Recent Labs  Lab 10/22/19 1632 10/22/19 2009 10/23/19 0805 10/23/19 1146 10/23/19 1644  GLUCAP 162* 146* 174* 192* 182*   Urine analysis:    Component Value Date/Time   COLORURINE YELLOW 10/12/2019 1544   APPEARANCEUR HAZY (A) 10/12/2019 1544   LABSPEC 1.014 10/12/2019 1544   PHURINE 5.0 10/12/2019 1544   GLUCOSEU NEGATIVE 10/12/2019 1544   HGBUR MODERATE (A) 10/12/2019 1544   BILIRUBINUR NEGATIVE 10/12/2019 1544   KETONESUR NEGATIVE 10/12/2019 1544   PROTEINUR NEGATIVE 10/12/2019 1544   NITRITE NEGATIVE 10/12/2019 1544   LEUKOCYTESUR NEGATIVE 10/12/2019 1544   Sepsis Labs: No results found for this or any previous visit (from the past 240 hour(s)).   Scheduled Meds: . (feeding supplement) PROSource Plus  30 mL Oral TID BM  . sodium chloride   Intravenous Once  . vitamin C  500 mg Oral Daily  . busPIRone  5 mg Oral TID  . Chlorhexidine Gluconate Cloth  6 each Topical Daily  . digoxin  0.125 mg Oral Daily  . diltiazem  240 mg Oral Daily  . furosemide  20 mg Intravenous BID  . hyoscyamine  0.25 mg Sublingual TID AC  . influenza vac split quadrivalent PF  0.5 mL Intramuscular Tomorrow-1000  . insulin aspart  0-20 Units Subcutaneous TID WC  . iron polysaccharides  150  mg Oral Daily  . metoprolol tartrate  12.5 mg Oral BID  . mirtazapine  15 mg Oral QHS  . multivitamin-lutein  1 capsule Oral Daily  . pantoprazole  40 mg Oral BID AC  . sodium chloride flush  10-40 mL Intracatheter Q12H  . tamsulosin  0.4 mg Oral QPC supper  . zinc sulfate  220 mg Oral Daily   Continuous Infusions: . sodium chloride 250 mL (10/21/19 2156)  . albumin human 25 g (10/23/19 1755)  . lactated ringers 20 mL/hr at 10/18/19 0000    Procedures/Studies: CT ANGIO  CHEST PE W OR WO CONTRAST  Result Date: 10/03/2019 CLINICAL DATA:  COVID-19 positive, weakness, dizziness and shortness of breath EXAM: CT ANGIOGRAPHY CHEST WITH CONTRAST TECHNIQUE: Multidetector CT imaging of the chest was performed using the standard protocol during bolus administration of intravenous contrast. Multiplanar CT image reconstructions and MIPs were obtained to evaluate the vascular anatomy. CONTRAST:  125m OMNIPAQUE IOHEXOL 350 MG/ML SOLN COMPARISON:  Radiograph 09/28/2019, CT abdomen pelvis 08/15/2018 FINDINGS: Cardiovascular: Satisfactory opacification the pulmonary arteries to the segmental level. No pulmonary artery filling defects are identified. Central pulmonary arteries are normal caliber. Suboptimal opacification of the thoracic aorta. No discernible acute abnormality of the thoracic aorta proximal great vessels. Aberrant right subclavian artery. Borderline cardiac enlargement. Slight reflux of contrast into the IVC. Mediastinum/Nodes: No mediastinal fluid or gas. Normal thyroid gland and thoracic inlet. No acute abnormality of the trachea or esophagus. No worrisome mediastinal, hilar or axillary adenopathy. Lungs/Pleura: Mixed areas of heterogeneous consolidative and ground-glass opacity throughout both lungs with a basilar and peripheral predominance. No pneumothorax or visible effusion. Diffuse airways thickening. Upper Abdomen: Indeterminate intermediate attenuation (38 HU) lesion measuring up to 8.5 cm in maximum transaxial dimension within the right lobe liver (4/80). Cholelithiasis with a partially calcified gallstone towards the gallbladder neck. Multiple soft tissue attenuation lesions present along the lesser curvature/gastrohepatic ligament. Largest measuring up to 4.6 cm in size (4/91). Partially exophytic 0.7 cm lesion arising from the upper pole left kidney, corresponding with a previously seen fluid attenuation cyst on prior comparison CT, could reflect intracystic  hemorrhage/proteinaceous debris. Musculoskeletal: Multilevel degenerative changes are present in the imaged portions of the spine. No acute osseous abnormality or suspicious osseous lesion. Probable intramuscular lipoma of the right infraspinatus measuring up to 6.9 by 3.5 cm (4/20). No other acute or worrisome chest wall lesions. Review of the MIP images confirms the above findings. IMPRESSION: 1. No evidence of acute pulmonary artery filling defects. 2. Mixed areas of heterogeneous consolidative and ground-glass opacity throughout both lungs with a basilar and peripheral predominance. Findings are compatible with multifocal pneumonia compatible with a COVID-19 etiology. 3. Indeterminate 4.8 cm lesion in the right lobe liver, incompletely characterized. Additional multiple soft tissue attenuation lesions along the lesser curvature/gastrohepatic ligament, largest measuring up to 4.6 cm in size. These findings are new from comparison abdominal CT 08/15/2018 and could raise concern for potential malignancy. Could consider further evaluation with abdominal MR with contrast. 4. Additional 7 mm hyperdense lesion arising from the upper pole left kidney, could reflect proteinaceous cysts, Ob also be better evaluated on dedicated abdominal imaging. 5. Cholelithiasis. 6. Aberrant right subclavian artery. These results will be called to the ordering clinician or representative by the Radiologist Assistant, and communication documented in the PACS or [employer] Electronically Signed   By: PLovena LeM.D.   On: 10/03/2019 15:38   CT ABDOMEN PELVIS W CONTRAST  Result Date: 10/11/2019 CLINICAL DATA:  COVID  positive. EXAM: CT ABDOMEN AND PELVIS WITH CONTRAST TECHNIQUE: Multidetector CT imaging of the abdomen and pelvis was performed using the standard protocol following bolus administration of intravenous contrast. CONTRAST:  21m OMNIPAQUE IOHEXOL 300 MG/ML  SOLN COMPARISON:  August 15, 2018 FINDINGS: Lower chest:  Marked severity bilateral lower lobe infiltrates are seen. Hepatobiliary: Multiple large heterogeneous low-attenuation liver masses are seen. The largest measures approximately 6.0 cm x 5.3 cm x 6.1 cm. A 1.7 cm gallstone is seen within the neck of an otherwise normal-appearing gallbladder. Pancreas: Unremarkable. No pancreatic ductal dilatation or surrounding inflammatory changes. Spleen: Normal in size without focal abnormality. Adrenals/Urinary Tract: Adrenal glands are unremarkable. Kidneys are normal in size, without renal calculi or hydronephrosis. A stable 1.2 cm diameter area of heterogeneous low attenuation is seen within the medial aspect of the mid to lower right kidney. A stable 1.2 cm cyst is seen along the posterior aspect of the upper pole of the left kidney. Bladder is unremarkable. Stomach/Bowel: A 3.0 cm x 8.7 cm x 6.3 cm heterogeneous soft tissue mass is seen extending from the wall of the lesser sac of the stomach into the gastric lumen (axial CT images 15 through 25, CT series number 2). Appendix appears normal. There is no evidence of bowel dilatation. Numerous diverticula are seen throughout the descending and sigmoid colon. Moderate severity thickening of the proximal sigmoid colon is noted (best seen on coronal reformatted images 43 through 50, CT series number 5). Vascular/Lymphatic: No significant vascular findings are present. Reproductive: The prostate gland is markedly enlarged. Other: Multiple round heterogeneous soft tissue masses of various sizes are seen within the upper abdomen, adjacent to the lesser sac of the stomach. The largest measures approximately 5.2 cm x 5.6 cm x 4.6 cm. Musculoskeletal: Multilevel degenerative changes are seen throughout the lumbar spine. IMPRESSION: 1. Marked severity bilateral lower lobe infiltrates. 2. Multiple large heterogeneous low-attenuation liver masses, consistent with metastatic disease. 3. Large gastric mass, suspicious for primary neoplasm.  4. Multiple soft tissue masses within the mesentery of the upper abdomen, adjacent to the lesser sac of the stomach, consistent with metastatic disease. 5. Colonic diverticulosis. 6. Thickening of the proximal sigmoid colon which may represent sequelae associated with mild colitis/diverticulitis correlation with follow-up abdomen pelvis CT is recommended to exclude the presence of an underlying neoplasm. 7. Stable area of low attenuation within the right kidney. While this may represent a hemorrhagic cyst, correlation with renal ultrasound is recommended. 8. Cholelithiasis. Electronically Signed   By: TVirgina NorfolkM.D.   On: 10/11/2019 23:24   MR LIVER W WO CONTRAST  Result Date: 10/04/2019 CLINICAL DATA:  Evaluate liver lesions and upper abdominal adenopathy seen on recent chest CT. EXAM: MRI ABDOMEN WITHOUT AND WITH CONTRAST TECHNIQUE: Multiplanar multisequence MR imaging of the abdomen was performed both before and after the administration of intravenous contrast. CONTRAST:  159mGADAVIST GADOBUTROL 1 MMOL/ML IV SOLN COMPARISON:  CT abdomen/pelvis 08/15/2018 FINDINGS: Lower chest: The lungs demonstrate changes of COVID pneumonia as demonstrated on today's chest CT. Hepatobiliary: Numerous diffusion positive hepatic metastatic lesions are noted. Segment 4A lesion measures 3.7 cm on image 7/8. 5 cm lesion and segment 7 on image 12/8. 5 cm lesion in segment 6 on image 18/8. Several other smaller lesions are noted. No intrahepatic biliary dilatation. There is diffuse fatty infiltration of the liver noted. A 2 cm gallstone is noted in the gallbladder. No common bile duct dilatation. Pancreas:  No mass, inflammation or ductal dilatation. Spleen:  Normal size.  No focal lesions. Adrenals/Urinary Tract: The adrenal glands and kidneys are unremarkable. Stomach/Bowel: There is a large infiltrating gastric mass involving the fundal region 6.5 x 4.8 cm and is diffusion positive. Moderate contrast enhancement is  noted. Adjacent gastrohepatic ligament lymphadenopathy with the largest node measuring 4.6 cm. Vascular/Lymphatic: The aorta is normal in caliber. No dissection. The branch vessels are patent. No retroperitoneal lymphadenopathy. Other:  No ascites or abdominal wall hernia. Musculoskeletal: No worrisome bone lesions. IMPRESSION: 1. 6.5 x 4.8 cm infiltrating gastric mass involving the fundal region. Associated gastrohepatic ligament lymphadenopathy. 2. Numerous hepatic metastatic lesions. 3. Cholelithiasis. Electronically Signed   By: Marijo Sanes M.D.   On: 10/04/2019 06:26   US RENAL  Result Date: 10/13/2019 CLINICAL DATA:  Acute kidney injury, recent discovery of abdominal and liver masses. EXAM: RENAL / URINARY TRACT ULTRASOUND COMPLETE COMPARISON:  MRI 10/04/2019 and CT 10/11/2019 FINDINGS: Right Kidney: Renal measurements: 12.9 x 5.6 x 5.7 cm = volume: 213 mL. Echogenicity within normal limits. No mass or hydronephrosis visualized. Left Kidney: Renal measurements: 14 x 6.3 x 5.6 cm = volume: 258 mL. Echogenicity within normal limits. No mass or hydronephrosis visualized. Bladder: Foley catheter in the urinary bladder. Other: Incidental liver masses in the RIGHT hepatic lobe measuring 4.3 x 3.4 x 3.6 cm and 6.3 x 4.4 x 5.6 cm, grossly similar accounting for differences in angle of measurement and technique as compared to recent imaging studies. One of these measurements may combine the dimensions of 2 adjacent masses, would refer to previous CT and MRI for follow-up measurements. IMPRESSION: 1. No hydronephrosis. 2. Hepatic metastatic disease better visualized on recent CTs. Electronically Signed   By: Zetta Bills M.D.   On: 10/13/2019 14:20   DG CHEST PORT 1 VIEW  Result Date: 10/13/2019 CLINICAL DATA:  History of COVID-19 positivity, status post left PICC line placement EXAM: PORTABLE CHEST 1 VIEW COMPARISON:  09/28/2019 FINDINGS: Cardiac shadow is stable. Patchy airspace opacities are noted in both  lungs increased from the prior exam particularly in the left retrocardiac region consistent with progressive COVID-19 pneumonia. Left-sided PICC line is seen with the catheter tip at the cavoatrial junction. No bony abnormality is noted. IMPRESSION: PICC line in satisfactory position. Increase in the degree of bilateral patchy airspace opacity consistent with COVID-19 positivity. Electronically Signed   By: Inez Catalina M.D.   On: 10/13/2019 20:06   DG Chest Port 1 View  Result Date: 09/28/2019 CLINICAL DATA:  COVID EXAM: PORTABLE CHEST 1 VIEW COMPARISON:  09/24/2019 FINDINGS: Development of patchy bilateral airspace opacities. No pleural effusion. Stable cardiomediastinal silhouette. No pneumothorax. IMPRESSION: Development of patchy bilateral airspace opacities, consistent with bilateral pneumonia. Electronically Signed   By: Donavan Foil M.D.   On: 09/28/2019 23:14   DG Chest Port 1 View  Result Date: 09/24/2019 CLINICAL DATA:  COVID pneumonia, weakness, fatigue EXAM: PORTABLE CHEST 1 VIEW COMPARISON:  09/15/2019 FINDINGS: The lungs are symmetrically expanded. Minimal left basilar atelectasis is again noted. No superimposed confluent pulmonary infiltrate. No pneumothorax or pleural effusion. Cardiac size within normal limits. The pulmonary vascularity is normal. No acute bone abnormality. IMPRESSION: Minimal left basilar atelectasis, unchanged. No superimposed confluent pulmonary infiltrate. Electronically Signed   By: Fidela Salisbury MD   On: 09/24/2019 20:15   ECHOCARDIOGRAM COMPLETE  Result Date: 09/30/2019    ECHOCARDIOGRAM REPORT   Patient Name:   Stephen Wilcox Date of Exam: 09/30/2019 Medical Rec #:  [id number]         Height:  72.0 in Accession #:    [id number]        Weight:       237.0 lb Date of Birth:  [birth year]        BSA:          2.290 m Patient Age:    9 years          BP:           95/60 mmHg Patient Gender: M                 HR:           126 bpm. Exam Location:  [facility]  Procedure: 2D Echo Indications:    Endocarditis I38  History:        Patient has no prior history of Echocardiogram examinations.                 Risk Factors:Non-Smoker. Pneumonia due to COVID-19 virus,                 Sepsis, Lactic Acidosis.  Sonographer:    Leavy Cella RDCS (AE) Referring Phys: [id number] OLADAPO ADEFESO IMPRESSIONS  1. Left ventricular ejection fraction, by estimation, is 55 to 60%. The left ventricle has normal function. The left ventricle has no regional wall motion abnormalities. There is moderate left ventricular hypertrophy. Left ventricular diastolic parameters are indeterminate.  2. Right ventricular systolic function is normal. The right ventricular size is normal. Tricuspid regurgitation signal is inadequate for assessing PA pressure.  3. The mitral valve is grossly normal. No evidence of mitral valve regurgitation.  4. The aortic valve is tricuspid. Aortic valve regurgitation is not visualized.  5. The inferior vena cava is normal in size with <50% respiratory variability, suggesting right atrial pressure of 8 mmHg.  6. Views are somewhat limited, but no definite valvular vegetations are visualized. FINDINGS  Left Ventricle: Left ventricular ejection fraction, by estimation, is 55 to 60%. The left ventricle has normal function. The left ventricle has no regional wall motion abnormalities. The left ventricular internal cavity size was normal in size. There is  moderate left ventricular hypertrophy. Left ventricular diastolic parameters are indeterminate. Right Ventricle: The right ventricular size is normal. No increase in right ventricular wall thickness. Right ventricular systolic function is normal. Tricuspid regurgitation signal is inadequate for assessing PA pressure. Left Atrium: Left atrial size was normal in size. Right Atrium: Right atrial size was normal in size. Pericardium: There is no evidence of pericardial effusion. Mitral Valve: The mitral valve is grossly normal. No  evidence of mitral valve regurgitation. Tricuspid Valve: The tricuspid valve is grossly normal. Tricuspid valve regurgitation is trivial. Aortic Valve: The aortic valve is tricuspid. Aortic valve regurgitation is not visualized. Pulmonic Valve: The pulmonic valve was grossly normal. Pulmonic valve regurgitation is trivial. Aorta: The aortic root is normal in size and structure. Venous: The inferior vena cava is normal in size with less than 50% respiratory variability, suggesting right atrial pressure of 8 mmHg. IAS/Shunts: No atrial level shunt detected by color flow Doppler.  LEFT VENTRICLE PLAX 2D LVIDd:         4.37 cm Diastology LVIDs:         2.20 cm LV e' medial:    7.18 cm/s LV PW:         1.72 cm LV E/e' medial:  9.2 LV IVS:        1.46 cm LV e' lateral:   12.90 cm/s  LV E/e' lateral: 5.1  RIGHT VENTRICLE RV S prime:     16.00 cm/s TAPSE (M-mode): 1.5 cm LEFT ATRIUM             Index       RIGHT ATRIUM           Index LA diam:        3.70 cm 1.62 cm/m  RA Area:     15.40 cm LA Vol (A2C):   63.9 ml 27.90 ml/m RA Volume:   39.80 ml  17.38 ml/m LA Vol (A4C):   40.6 ml 17.73 ml/m LA Biplane Vol: 52.4 ml 22.88 ml/m   AORTA Ao Root diam: 3.50 cm MITRAL VALVE MV Area (PHT): 6.60 cm MV Decel Time: 115 msec MV E velocity: 66.00 cm/s MV A velocity: 39.80 cm/s MV E/A ratio:  1.66 Rozann Lesches MD Electronically signed by Rozann Lesches MD Signature Date/Time: 09/30/2019/4:58:16 PM    Final    [place] EKG SITE RITE  Result Date: 10/13/2019 If Site Rite image not attached, placement could not be confirmed due to current cardiac rhythm.   Kathie Dike, MD  [facility]  If 7PM-7AM, please contact night-coverage [email or web address]  10/23/2019, 7:08 PM   LOS: 25 days

## 2019-10-23 NOTE — Progress Notes (Signed)
   10/23/19 0447  Assess: MEWS Score  Temp 97.7 F (36.5 C)  BP 124/85  Pulse Rate (!) 112  Resp 20  SpO2 95 %  Assess: MEWS Score  MEWS Temp 0  MEWS Systolic 0  MEWS Pulse 2  MEWS RR 0  MEWS LOC 0  MEWS Score 2  MEWS Score Color Yellow  Assess: if the MEWS score is Yellow or Red  Were vital signs taken at a resting state? Yes  Focused Assessment No change from prior assessment  Early Detection of Sepsis Score *See Row Information* Low  MEWS guidelines implemented *See Row Information* No, vital signs rechecked

## 2019-10-23 NOTE — Progress Notes (Signed)
   10/23/19 0813  Assess: MEWS Score  Pulse Rate (!) 119  Assess: MEWS Score  MEWS Temp 0  MEWS Systolic 0  MEWS Pulse 2  MEWS RR 0  MEWS LOC 0  MEWS Score 2  MEWS Score Color Yellow  Assess: if the MEWS score is Yellow or Red  Were vital signs taken at a resting state? Yes  Focused Assessment No change from prior assessment  Early Detection of Sepsis Score *See Row Information* Low  MEWS guidelines implemented *See Row Information* No, vital signs rechecked  Treat  MEWS Interventions Administered scheduled meds/treatments  Pain Scale 0-10  Pain Score 0  Notify: Charge Nurse/RN  Name of Charge Nurse/RN Notified L. Bullins RN  Date Charge Nurse/RN Notified 10/23/19  Time Charge Nurse/RN Notified 0830  Notify: Provider  Provider Name/Title  Roderic Palau)  Date Provider Notified 10/23/19  Time Provider Notified 0830  Notification Type Page  Notification Reason Change in status  Response No new orders  Date of Provider Response 10/23/19  Document  Progress note created (see row info) Yes

## 2019-10-24 DIAGNOSIS — Z7189 Other specified counseling: Secondary | ICD-10-CM | POA: Diagnosis not present

## 2019-10-24 DIAGNOSIS — C787 Secondary malignant neoplasm of liver and intrahepatic bile duct: Secondary | ICD-10-CM | POA: Diagnosis not present

## 2019-10-24 DIAGNOSIS — A419 Sepsis, unspecified organism: Secondary | ICD-10-CM | POA: Diagnosis not present

## 2019-10-24 DIAGNOSIS — I4891 Unspecified atrial fibrillation: Secondary | ICD-10-CM | POA: Diagnosis not present

## 2019-10-24 DIAGNOSIS — N179 Acute kidney failure, unspecified: Secondary | ICD-10-CM | POA: Diagnosis not present

## 2019-10-24 DIAGNOSIS — U071 COVID-19: Secondary | ICD-10-CM | POA: Diagnosis not present

## 2019-10-24 DIAGNOSIS — Z515 Encounter for palliative care: Secondary | ICD-10-CM | POA: Diagnosis not present

## 2019-10-24 LAB — CBC
HCT: 20.2 % — ABNORMAL LOW (ref 39.0–52.0)
Hemoglobin: 5.8 g/dL — CL (ref 13.0–17.0)
MCH: 24.2 pg — ABNORMAL LOW (ref 26.0–34.0)
MCHC: 28.7 g/dL — ABNORMAL LOW (ref 30.0–36.0)
MCV: 84.2 fL (ref 80.0–100.0)
Platelets: 420 10*3/uL — ABNORMAL HIGH (ref 150–400)
RBC: 2.4 MIL/uL — ABNORMAL LOW (ref 4.22–5.81)
RDW: 21.5 % — ABNORMAL HIGH (ref 11.5–15.5)
WBC: 9.6 10*3/uL (ref 4.0–10.5)
nRBC: 3.3 % — ABNORMAL HIGH (ref 0.0–0.2)

## 2019-10-24 LAB — COMPREHENSIVE METABOLIC PANEL
ALT: 31 U/L (ref 0–44)
AST: 24 U/L (ref 15–41)
Albumin: 2 g/dL — ABNORMAL LOW (ref 3.5–5.0)
Alkaline Phosphatase: 118 U/L (ref 38–126)
Anion gap: 10 (ref 5–15)
BUN: 30 mg/dL — ABNORMAL HIGH (ref 8–23)
CO2: 28 mmol/L (ref 22–32)
Calcium: 8 mg/dL — ABNORMAL LOW (ref 8.9–10.3)
Chloride: 95 mmol/L — ABNORMAL LOW (ref 98–111)
Creatinine, Ser: 0.64 mg/dL (ref 0.61–1.24)
GFR calc Af Amer: >60 mL/min (ref >60)
GFR calc non Af Amer: >60 mL/min (ref >60)
Glucose, Bld: 180 mg/dL — ABNORMAL HIGH (ref 70–99)
Potassium: 3.9 mmol/L (ref 3.5–5.1)
Sodium: 133 mmol/L — ABNORMAL LOW (ref 135–145)
Total Bilirubin: 0.4 mg/dL (ref 0.3–1.2)
Total Protein: 4.2 g/dL — ABNORMAL LOW (ref 6.5–8.1)

## 2019-10-24 LAB — GLUCOSE, CAPILLARY
Glucose-Capillary: 173 mg/dL — ABNORMAL HIGH (ref 70–99)
Glucose-Capillary: 190 mg/dL — ABNORMAL HIGH (ref 70–99)
Glucose-Capillary: 172 mg/dL — ABNORMAL HIGH (ref 70–99)
Glucose-Capillary: 150 mg/dL — ABNORMAL HIGH (ref 70–99)

## 2019-10-24 LAB — PREPARE RBC (CROSSMATCH)

## 2019-10-24 MED ORDER — SODIUM CHLORIDE 0.9% IV SOLUTION: Freq: Once | INTRAVENOUS | Status: AC

## 2019-10-24 MED ORDER — ALUM & MAG HYDROXIDE-SIMETH 200-200-20 MG/5ML PO SUSP
30.0000 mL | ORAL | Status: DC | PRN
  Administered 2019-10-24: 30 mL via ORAL
  Filled 2019-10-24: qty 30

## 2019-10-24 MED ORDER — HYDROCORTISONE (PERIANAL) 2.5 % EX CREA
1.0000 "application " | TOPICAL_CREAM | Freq: Four times a day (QID) | CUTANEOUS | Status: DC | PRN
  Filled 2019-10-24: qty 28.35

## 2019-10-24 NOTE — Progress Notes (Signed)
2300- unable to scan second unit of blood r/t unit number discrepancy. Verified with blood bank, per blood bank this happens often with blood we receive from [facility]. All numbers verified by blood bank and 2 RNs. Per blood bank was told to run blood as if during a computer downtime as unit will not scan properly. Pt's pre-blood vitals: T=98.4,  BP= 103/68, HR=97, RR=15, SpO2=97% on 5LNC. Vitals 15 min into 2nd transfusion: T=98.1, BP=110/68, HR=102, RR-14, and SpO2=97% on 5LNC. Pt comfortable and resting, will continue to monitor.

## 2019-10-24 NOTE — Progress Notes (Signed)
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 Ongoing weakness and poor intake.  I spent time today with Stephen Wilcox. Stephen Wilcox feels bad today but his hemoglobin is very low at 5.8 and plans for transfusion. Likely related to gastric cancer although no documentation of bloody stools noted. This is a recurrent issue.   Stephen Wilcox talks today and seems more accepting of poor prognosis. He tells me that this is not how he pictured his life going and that this stage of life he was hoping to enjoy life by spending more time with family and taking day trips with his wife. He does talk about how he really wants to just walk out of here better but says he knows that is not going to happen. When we discuss the next best thing he shares that this would be hospice for him. We spoke further about hospice philosophy and focus on comfort with no further testing or blood work. He tells me that this is good because he is tired of being a pin cushion. He also tells me that he really does not want to die in a hospital. He would desire to be at hospice and be comfortable and not suffer when his time comes. He shares his experience with his father with hospice.   Stephen Wilcox lunch tray is at bedside untouched. He reports that he can eat a little some days but nothing others. He does report that he enjoys some fruit for breakfast and pudding cups. He drinks one supplement a day. The rest of our conversation he shares about what he has tried to instill in his children and teach the importance of family and sticking together even after he is gone. He talks  of trips they have taken together and memories made.   I also spoke with daughter, Stephen Wilcox. I shared with her the above conversation. She shares that her conversations with him lately have also seemed that he is more accepting of poor prognosis. She is supportive of hospice facility and comfort approach as well if this is what he wants. She shares that his sister is flying in tomorrow and Stephen Wilcox and her brother plan to be here Wednesday for visit. She is not opposed to him transitioning to hospice at anytime if this is what he wants.   All questions/concerns addressed. Emotional support provided. Discussed with Dr. Roderic Palau and Nira Conn CSW.   Exam: Alert, oriented. No distress. Breathing regular, a little labored with conversation at times. Abd distended and tender. Anasarca generalized.   Plan: - Plan for transition to more comfort focused care and hospice facility desired by patient.  - With ongoing chronic blood loss anticipated and lack of aggressive care to continue prognosis is poor and likely < 6 weeks. He is extremely weak and fatigue with severe deconditioning and poor intake s/p COVID infection with underlying malignancy and not a good candidate for rehab.   50 min  [name], NP Palliative Medicine Team Pager [phone] (Please see [email or web address] for schedule) Team Phone [phone]    Greater than 50%  of this time was spent counseling and coordinating care related to the above assessment and plan

## 2019-10-24 NOTE — TOC Progression Note (Signed)
Transition of Care [facility]) - Progression Note    Patient Details  Name: Stephen Wilcox MRN: [id number] Date of Birth: 18-Jul-1955  Transition of Care [facility]) CM/SW Contact  Ihor Gully, LCSW Phone Number: 10/24/2019, 3:58 PM  Clinical Narrative:    Patient agreeable to residential hospice. Stephanie at [facility] advised of decision. Colletta [place] to have patient reassessed for hospice home appropriateness as last week he was 8 weeks. Patient has to be 6 weeks or less to qualify for [facility].    Expected Discharge Plan: [place] Barriers to Discharge: Continued Medical Work up  Expected Discharge Plan and Services Expected Discharge Plan: [place] In-house Referral: Clinical Social Work   Post Acute Care Choice: [place] Living arrangements for the past 2 months: Apartment                                       Social Determinants of Health (SDOH) Interventions    Readmission Risk Interventions No flowsheet data found.

## 2019-10-24 NOTE — TOC Progression Note (Signed)
Transition of Care [facility]) - Progression Note    Patient Details  Name: Stephen Wilcox MRN: [id number] Date of Birth: Jul 27, 1955  Transition of Care [facility]) CM/SW Contact  Ihor Gully, LCSW Phone Number: 10/24/2019, 4:47 PM  Clinical Narrative:    Per Colletta [place] at [facility]  patient can be changed to to GIP on 10/5. A nurse will come first thing in the morning to get consents signed.    Expected Discharge Plan: [place] Barriers to Discharge: Continued Medical Work up  Expected Discharge Plan and Services Expected Discharge Plan: [place] In-house Referral: Clinical Social Work   Post Acute Care Choice: [place] Living arrangements for the past 2 months: Apartment                                       Social Determinants of Health (SDOH) Interventions    Readmission Risk Interventions No flowsheet data found.

## 2019-10-24 NOTE — Progress Notes (Signed)
PROGRESS NOTE  [name] VKP:[id number] DOB: September 22, 1955 DOA: 09/28/2019 PCP: Jake Samples, PA-C  Brief History:  64 year old male without any documented significant chronic medical problems presenting to the ED on 09/24/2019 with lightheadedness malaise, and some generalized weakness.  Patient was diagnosed with positive Covid.  He had declined monoclonal antibody infusion.  On 09/28/2019, the patient presented back to the emergency department with worsening generalized weakness, dizziness, and cough and malaise.  He was noted to have oxygen saturation of 82% room air and placed on 2 L nasal cannula.  Was subsequently started on remdesivir and IV steroids.  At the time of admission, the patient was noted to have a hemoglobin of 7.3 which was a significant drop when compared to 09/24/2019 when his hemoglobin was 9.1.  Hemoccult was noted to be positive during admission.  GI was consulted to assist with management.  Dr. Laural Golden saw the patient.  He did not feel that the patient had any overt active GI bleeding.  He recommended PPI infusion for 72 hours and subsequently transitioned to oral Protonix.  He recommended endoscopic evaluation if the patient's hemoglobin continued to drop again requiring transfusion.  The patient was transfused 2 units PRBC.  His hemoglobin went up to 9.3.  The patient's oxygen demand gradually increased to 6 L, but subsequently remained stable.  Blood cultures grew Staph epidermidis in 1 of 2 sets.  This was thought to be a contaminant.  Empiric antibiotics were discontinued.  On 09/29/2019, the patient was noted to have atrial fibrillation.  EKG confirmed atrial fibrillation.  The patient was started on oral diltiazem.  Echocardiogram on 09/30/2019 showed EF 55 to 60%, no WMA. ---- Okay to discontinue airborne isolation precautions as of 10/20/2019 -Patient anticipating transportation to residential hospice   Assessment/Plan: Severe Sepsis with Septic Shock  Secondary to E. coli infection with Acute Hypotension/Elevated Lactic Acid/Leukocytosis -In retrospect severe sepsis with septic shock was most likely NOT present on admission -However -on 10/13/19 Clinically patient met criteria for severe E. coli sepsis with hemodynamic instability as above -elevated WBC's, lactic acid and episode of hypoglycemia. -Hemodynamic instability resolved, patient weaned off IV phenylephrine pressure support on 10/14/2019 -Urine culture from 10/12/2019 with E. coli that is resistant to penicillins, okay to de-escalate from Vanco and cefepime to Rocephin (completed iv Rocephin) -Flagyl discontinued -Blood cultures from 10/13/2019 NGTD - negative MRSA PCR -Sepsis pathophysiology has resolved  Acute metabolic encephalopathy---resolved -In the setting of acute urinary retention and E Coli UTI/Sepsis -Patient mentation back to baseline and currently oriented x3.  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 Delton Coombes who will follow him up as an outpatient -Current nutritional and functional status precludes chemotherapy -Palliative care consult appreciated, patient also met with hospice team, anticipate  transfer to residential hospice with life expectancy of less than 6 weeks  Anorexia/FTT/adjustment disorder with depressed mood--  patient's appetite remains very very poor -Continue Remeron for appetite stimulation and sleep and mood -Dietitian recommendations appreciated; continue calorie counting. -If patient is unable to maintain 1800 -calorie/day intake on full liquid diet, -Discussed with Dr. Arnoldo Morale and patient is not a candidate forr PEG tube placement due to adenocarcinoma of the stomach -Patient currently has left arm PICC line  Symptomatic Anemia/heme positive stool -Appreciate GI consult -Dr. Laural Golden followed the patient--> -Transfused 4 units PRBC during this hospitalization; last 2 units ordered on 10/13/2019 in the setting of acute drop in his hemoglobin count; part of these secondary to hemodilution after aggressive fluid resuscitation).. -in retrospect, gastric adenocarcinoma may have been source of bleed -Continue to follow hemoglobin trend and further transfuse as needed for hemoglobin less than 7. -Continue p.o. Protonix -On 10/4, hemoglobin noted to be 5.8.  Suspect chronic blood loss from gastric malignancy -Since patient is symptomatic, he was transfused 2 units PRBC.  New onset atrial fibrillation/Flutter---With persistent RVR -09/30/2019 echo EF 55 to 60%, no WMA -CHA2DS2-VASc = 1 -TSH--0.733 -Will continue holding on anticoagulation/aspirin in light of high risk for bleeding from gastric lesion. Continue digoxin and Cardizem for rate control -Started on low-dose Lopressor for tachycardia   Diabetes mellitus type 2 -09/28/2019 hemoglobin A1c 6.9 Use Novolog/Humalog Sliding scale insulin with Accu-Cheks/Fingersticks as ordered -Holding a nightly sliding scale insulin at this time.  Microcytic anemia/Iron deficiency -iron saturation 3% -ferritin low despite COVID-19 -Case discussed with Dr. Delton Coombes who has recommended initiation of Niferex -Continue  transfusion as needed.  Class II obesity -BMI 34.14---this increases overall morbidity and mortality  Acute kidney injury -Renal ultrasound without hydronephrosis or obstructive uropathy -Foley catheter placed -Urine culture with E. coli as above #1 -Renal function improved/normalized with IV fluids and treatment of E. coli UTI --Renally adjust medications, avoid nephrotoxic agents / dehydration  / hypotension -Creatinine has normalized (peaked at 2.0), baseline usually around 0.7  BPH/urinary retention -Continue Flomax -Foley removed on 10/16/2019, patient failed voiding trial and Foley was reinserted on 10/16/2019 -Renal ultrasound without obstructive uropathy or hydronephrosis  Bacteremia -Blood cultures from 09/28/2019 Staph epidermidis 1 out of 2 sets -Represents contaminant -Repeat blood cultures from 10/13/2019 NGTD  Generalized Weakness and Deconditioning -PT evaluation--> skilled nursing facility -Poor nutritional status, Covid infection, underlying gastric malignancy or contributing to patient's weakness and deconditioning -Anticipate transition to residential hospice   Social/Ethics---  -Extensive conversations with patient, palliative care provider and patient's daughter who lives in [place]--- current plan of care, underlying diagnosis, prognosis and goals of care discussed --he wishes to be a DNR/DNI --Palliative care consult appreciated, patient also met with hospice team, anticipate transfer to residential hospice with life expectancy of less than 6 weeks --Patient's daughter who lives in [place] and patient's son who lives in [place] would like to, see patient face-to-face prior to transition to residential hospice  Status is: Inpatient  Remains inpatient appropriate because: -Awaiting transfer to residential hospice  disposition: The patient is from: Home  Anticipated d/c is to:  Residential hospice  Anticipated d/c  date is: to be determined   Family Communication:   Discussed with Daughter [name] at [phone] again on 10/23/19  Consultants:  GI--Rehman/palliative care and hospice  Code Status:  DNR  DVT Prophylaxis:  SCDs (Gi Bleed)  Procedures: As Listed in Progress Note Above  Antibiotics: None  Subjective: Describes pressure in his  abdomen after eating today.  P.o. intake has been poor.  Denies any worsening shortness of breath.  Feels tired and weak  Objective: Vitals:   10/23/19 1500 10/23/19 2205 10/24/19 0452 10/24/19 1336  BP: 132/78  105/68 (!) 111/59  Pulse:  90 98 (!) 104  Resp: $Remo'18 20 18 19  'EwXOl$ Temp: 98 F (36.7 C)  98 F (36.7 C) 98.2 F (36.8 C)  TempSrc: Oral  Oral   SpO2: 95% 94% 97% 96%  Weight:      Height:        Intake/Output Summary (Last 24 hours) at 10/24/2019 1920 Last data filed at 10/24/2019 1849 Gross per 24 hour  Intake 934.05 ml  Output 800 ml  Net 134.05 ml   Weight change:   Exam: General exam: Alert, awake, oriented x 3 Respiratory system: Clear to auscultation. Respiratory effort normal. Cardiovascular system:RRR. No murmurs, rubs, gallops. Gastrointestinal system: Abdomen is nondistended, soft and nontender. No organomegaly or masses felt. Normal bowel sounds heard. Central nervous system: Alert and oriented. No focal neurological deficits. Extremities: 2+ edema bilaterally with anasarca Skin: No rashes, lesions or ulcers Psychiatry: Judgement and insight appear normal. Mood & affect appropriate.      Data Reviewed: I have personally reviewed following labs and imaging studies  Basic Metabolic Panel: Recent Labs  Lab 10/18/19 0525 10/21/19 0851 10/22/19 0710 10/24/19 0636  NA 134* 133* 135 133*  K 3.8 3.4* 4.5 3.9  CL 96* 94* 98 95*  CO2 $Re'31 31 30 28  'mhB$ GLUCOSE 166* 167* 166* 180*  BUN 20 18 26* 30*  CREATININE 0.62 0.61 0.54* 0.64  CALCIUM 7.7* 7.8* 7.6* 8.0*  PHOS  --   --  2.7  --    Liver  Function Tests: Recent Labs  Lab 10/21/19 0851 10/22/19 0710 10/24/19 0636  AST 21  --  24  ALT 43  --  31  ALKPHOS 137*  --  118  BILITOT 0.3  --  0.4  PROT 4.7*  --  4.2*  ALBUMIN 1.9* 1.9* 2.0*   CBC: Recent Labs  Lab 10/18/19 0525 10/21/19 0851 10/22/19 0710 10/24/19 0636  WBC 8.3 6.2 7.2 9.6  HGB 9.2* 8.2* 8.1* 5.8*  HCT 30.6* 27.0* 27.1* 20.2*  MCV 82.7 82.1 81.6 84.2  PLT 96* 175 274 420*   CBG: Recent Labs  Lab 10/23/19 1146 10/23/19 1644 10/24/19 0741 10/24/19 1116 10/24/19 1657  GLUCAP 192* 182* 173* 190* 172*   Urine analysis:    Component Value Date/Time   COLORURINE YELLOW 10/12/2019 1544   APPEARANCEUR HAZY (A) 10/12/2019 1544   LABSPEC 1.014 10/12/2019 1544   PHURINE 5.0 10/12/2019 1544   GLUCOSEU NEGATIVE 10/12/2019 1544   HGBUR MODERATE (A) 10/12/2019 1544   BILIRUBINUR NEGATIVE 10/12/2019 1544   KETONESUR NEGATIVE 10/12/2019 1544   PROTEINUR NEGATIVE 10/12/2019 1544   NITRITE NEGATIVE 10/12/2019 1544   LEUKOCYTESUR NEGATIVE 10/12/2019 1544   Sepsis Labs: No results found for this or any previous visit (from the past 240 hour(s)).   Scheduled Meds: . (feeding supplement) PROSource Plus  30 mL Oral TID BM  . sodium chloride   Intravenous Once  . vitamin C  500 mg Oral Daily  . busPIRone  5 mg Oral TID  . Chlorhexidine Gluconate Cloth  6 each Topical Daily  . digoxin  0.125 mg Oral Daily  . diltiazem  240 mg Oral Daily  . furosemide  20 mg Intravenous BID  . hyoscyamine  0.25 mg Sublingual TID AC  .  influenza vac split quadrivalent PF  0.5 mL Intramuscular Tomorrow-1000  . insulin aspart  0-20 Units Subcutaneous TID WC  . iron polysaccharides  150 mg Oral Daily  . metoprolol tartrate  12.5 mg Oral BID  . mirtazapine  15 mg Oral QHS  . multivitamin-lutein  1 capsule Oral Daily  . pantoprazole  40 mg Oral BID AC  . sodium chloride flush  10-40 mL Intracatheter Q12H  . tamsulosin  0.4 mg Oral QPC supper  . zinc sulfate  220 mg Oral  Daily   Continuous Infusions: . sodium chloride 250 mL (10/21/19 2156)  . albumin human 25 g (10/24/19 0834)  . lactated ringers 20 mL/hr at 10/18/19 0000    Procedures/Studies: CT ANGIO CHEST PE W OR WO CONTRAST  Result Date: 10/03/2019 CLINICAL DATA:  COVID-19 positive, weakness, dizziness and shortness of breath EXAM: CT ANGIOGRAPHY CHEST WITH CONTRAST TECHNIQUE: Multidetector CT imaging of the chest was performed using the standard protocol during bolus administration of intravenous contrast. Multiplanar CT image reconstructions and MIPs were obtained to evaluate the vascular anatomy. CONTRAST:  118mL OMNIPAQUE IOHEXOL 350 MG/ML SOLN COMPARISON:  Radiograph 09/28/2019, CT abdomen pelvis 08/15/2018 FINDINGS: Cardiovascular: Satisfactory opacification the pulmonary arteries to the segmental level. No pulmonary artery filling defects are identified. Central pulmonary arteries are normal caliber. Suboptimal opacification of the thoracic aorta. No discernible acute abnormality of the thoracic aorta proximal great vessels. Aberrant right subclavian artery. Borderline cardiac enlargement. Slight reflux of contrast into the IVC. Mediastinum/Nodes: No mediastinal fluid or gas. Normal thyroid gland and thoracic inlet. No acute abnormality of the trachea or esophagus. No worrisome mediastinal, hilar or axillary adenopathy. Lungs/Pleura: Mixed areas of heterogeneous consolidative and ground-glass opacity throughout both lungs with a basilar and peripheral predominance. No pneumothorax or visible effusion. Diffuse airways thickening. Upper Abdomen: Indeterminate intermediate attenuation (38 HU) lesion measuring up to 8.5 cm in maximum transaxial dimension within the right lobe liver (4/80). Cholelithiasis with a partially calcified gallstone towards the gallbladder neck. Multiple soft tissue attenuation lesions present along the lesser curvature/gastrohepatic ligament. Largest measuring up to 4.6 cm in size  (4/91). Partially exophytic 0.7 cm lesion arising from the upper pole left kidney, corresponding with a previously seen fluid attenuation cyst on prior comparison CT, could reflect intracystic hemorrhage/proteinaceous debris. Musculoskeletal: Multilevel degenerative changes are present in the imaged portions of the spine. No acute osseous abnormality or suspicious osseous lesion. Probable intramuscular lipoma of the right infraspinatus measuring up to 6.9 by 3.5 cm (4/20). No other acute or worrisome chest wall lesions. Review of the MIP images confirms the above findings. IMPRESSION: 1. No evidence of acute pulmonary artery filling defects. 2. Mixed areas of heterogeneous consolidative and ground-glass opacity throughout both lungs with a basilar and peripheral predominance. Findings are compatible with multifocal pneumonia compatible with a COVID-19 etiology. 3. Indeterminate 4.8 cm lesion in the right lobe liver, incompletely characterized. Additional multiple soft tissue attenuation lesions along the lesser curvature/gastrohepatic ligament, largest measuring up to 4.6 cm in size. These findings are new from comparison abdominal CT 08/15/2018 and could raise concern for potential malignancy. Could consider further evaluation with abdominal MR with contrast. 4. Additional 7 mm hyperdense lesion arising from the upper pole left kidney, could reflect proteinaceous cysts, Ob also be better evaluated on dedicated abdominal imaging. 5. Cholelithiasis. 6. Aberrant right subclavian artery. These results will be called to the ordering clinician or representative by the Radiologist Assistant, and communication documented in the PACS or [employer]. Electronically Signed  By: Lovena Le M.D.   On: 10/03/2019 15:38   CT ABDOMEN PELVIS W CONTRAST  Result Date: 10/11/2019 CLINICAL DATA:  COVID positive. EXAM: CT ABDOMEN AND PELVIS WITH CONTRAST TECHNIQUE: Multidetector CT imaging of the abdomen and pelvis was  performed using the standard protocol following bolus administration of intravenous contrast. CONTRAST:  46mL OMNIPAQUE IOHEXOL 300 MG/ML  SOLN COMPARISON:  August 15, 2018 FINDINGS: Lower chest: Marked severity bilateral lower lobe infiltrates are seen. Hepatobiliary: Multiple large heterogeneous low-attenuation liver masses are seen. The largest measures approximately 6.0 cm x 5.3 cm x 6.1 cm. A 1.7 cm gallstone is seen within the neck of an otherwise normal-appearing gallbladder. Pancreas: Unremarkable. No pancreatic ductal dilatation or surrounding inflammatory changes. Spleen: Normal in size without focal abnormality. Adrenals/Urinary Tract: Adrenal glands are unremarkable. Kidneys are normal in size, without renal calculi or hydronephrosis. A stable 1.2 cm diameter area of heterogeneous low attenuation is seen within the medial aspect of the mid to lower right kidney. A stable 1.2 cm cyst is seen along the posterior aspect of the upper pole of the left kidney. Bladder is unremarkable. Stomach/Bowel: A 3.0 cm x 8.7 cm x 6.3 cm heterogeneous soft tissue mass is seen extending from the wall of the lesser sac of the stomach into the gastric lumen (axial CT images 15 through 25, CT series number 2). Appendix appears normal. There is no evidence of bowel dilatation. Numerous diverticula are seen throughout the descending and sigmoid colon. Moderate severity thickening of the proximal sigmoid colon is noted (best seen on coronal reformatted images 43 through 50, CT series number 5). Vascular/Lymphatic: No significant vascular findings are present. Reproductive: The prostate gland is markedly enlarged. Other: Multiple round heterogeneous soft tissue masses of various sizes are seen within the upper abdomen, adjacent to the lesser sac of the stomach. The largest measures approximately 5.2 cm x 5.6 cm x 4.6 cm. Musculoskeletal: Multilevel degenerative changes are seen throughout the lumbar spine. IMPRESSION: 1. Marked  severity bilateral lower lobe infiltrates. 2. Multiple large heterogeneous low-attenuation liver masses, consistent with metastatic disease. 3. Large gastric mass, suspicious for primary neoplasm. 4. Multiple soft tissue masses within the mesentery of the upper abdomen, adjacent to the lesser sac of the stomach, consistent with metastatic disease. 5. Colonic diverticulosis. 6. Thickening of the proximal sigmoid colon which may represent sequelae associated with mild colitis/diverticulitis correlation with follow-up abdomen pelvis CT is recommended to exclude the presence of an underlying neoplasm. 7. Stable area of low attenuation within the right kidney. While this may represent a hemorrhagic cyst, correlation with renal ultrasound is recommended. 8. Cholelithiasis. Electronically Signed   By: Virgina Norfolk M.D.   On: 10/11/2019 23:24   MR LIVER W WO CONTRAST  Result Date: 10/04/2019 CLINICAL DATA:  Evaluate liver lesions and upper abdominal adenopathy seen on recent chest CT. EXAM: MRI ABDOMEN WITHOUT AND WITH CONTRAST TECHNIQUE: Multiplanar multisequence MR imaging of the abdomen was performed both before and after the administration of intravenous contrast. CONTRAST:  38mL GADAVIST GADOBUTROL 1 MMOL/ML IV SOLN COMPARISON:  CT abdomen/pelvis 08/15/2018 FINDINGS: Lower chest: The lungs demonstrate changes of COVID pneumonia as demonstrated on today's chest CT. Hepatobiliary: Numerous diffusion positive hepatic metastatic lesions are noted. Segment 4A lesion measures 3.7 cm on image 7/8. 5 cm lesion and segment 7 on image 12/8. 5 cm lesion in segment 6 on image 18/8. Several other smaller lesions are noted. No intrahepatic biliary dilatation. There is diffuse fatty infiltration of the liver noted. A  2 cm gallstone is noted in the gallbladder. No common bile duct dilatation. Pancreas:  No mass, inflammation or ductal dilatation. Spleen:  Normal size. No focal lesions. Adrenals/Urinary Tract: The adrenal  glands and kidneys are unremarkable. Stomach/Bowel: There is a large infiltrating gastric mass involving the fundal region 6.5 x 4.8 cm and is diffusion positive. Moderate contrast enhancement is noted. Adjacent gastrohepatic ligament lymphadenopathy with the largest node measuring 4.6 cm. Vascular/Lymphatic: The aorta is normal in caliber. No dissection. The branch vessels are patent. No retroperitoneal lymphadenopathy. Other:  No ascites or abdominal wall hernia. Musculoskeletal: No worrisome bone lesions. IMPRESSION: 1. 6.5 x 4.8 cm infiltrating gastric mass involving the fundal region. Associated gastrohepatic ligament lymphadenopathy. 2. Numerous hepatic metastatic lesions. 3. Cholelithiasis. Electronically Signed   By: Marijo Sanes M.D.   On: 10/04/2019 06:26   US RENAL  Result Date: 10/13/2019 CLINICAL DATA:  Acute kidney injury, recent discovery of abdominal and liver masses. EXAM: RENAL / URINARY TRACT ULTRASOUND COMPLETE COMPARISON:  MRI 10/04/2019 and CT 10/11/2019 FINDINGS: Right Kidney: Renal measurements: 12.9 x 5.6 x 5.7 cm = volume: 213 mL. Echogenicity within normal limits. No mass or hydronephrosis visualized. Left Kidney: Renal measurements: 14 x 6.3 x 5.6 cm = volume: 258 mL. Echogenicity within normal limits. No mass or hydronephrosis visualized. Bladder: Foley catheter in the urinary bladder. Other: Incidental liver masses in the RIGHT hepatic lobe measuring 4.3 x 3.4 x 3.6 cm and 6.3 x 4.4 x 5.6 cm, grossly similar accounting for differences in angle of measurement and technique as compared to recent imaging studies. One of these measurements may combine the dimensions of 2 adjacent masses, would refer to previous CT and MRI for follow-up measurements. IMPRESSION: 1. No hydronephrosis. 2. Hepatic metastatic disease better visualized on recent CTs. Electronically Signed   By: Zetta Bills M.D.   On: 10/13/2019 14:20   DG CHEST PORT 1 VIEW  Result Date: 10/13/2019 CLINICAL DATA:   History of COVID-19 positivity, status post left PICC line placement EXAM: PORTABLE CHEST 1 VIEW COMPARISON:  09/28/2019 FINDINGS: Cardiac shadow is stable. Patchy airspace opacities are noted in both lungs increased from the prior exam particularly in the left retrocardiac region consistent with progressive COVID-19 pneumonia. Left-sided PICC line is seen with the catheter tip at the cavoatrial junction. No bony abnormality is noted. IMPRESSION: PICC line in satisfactory position. Increase in the degree of bilateral patchy airspace opacity consistent with COVID-19 positivity. Electronically Signed   By: Inez Catalina M.D.   On: 10/13/2019 20:06   DG Chest Port 1 View  Result Date: 09/28/2019 CLINICAL DATA:  COVID EXAM: PORTABLE CHEST 1 VIEW COMPARISON:  09/24/2019 FINDINGS: Development of patchy bilateral airspace opacities. No pleural effusion. Stable cardiomediastinal silhouette. No pneumothorax. IMPRESSION: Development of patchy bilateral airspace opacities, consistent with bilateral pneumonia. Electronically Signed   By: Donavan Foil M.D.   On: 09/28/2019 23:14   DG Chest Port 1 View  Result Date: 09/24/2019 CLINICAL DATA:  COVID pneumonia, weakness, fatigue EXAM: PORTABLE CHEST 1 VIEW COMPARISON:  09/15/2019 FINDINGS: The lungs are symmetrically expanded. Minimal left basilar atelectasis is again noted. No superimposed confluent pulmonary infiltrate. No pneumothorax or pleural effusion. Cardiac size within normal limits. The pulmonary vascularity is normal. No acute bone abnormality. IMPRESSION: Minimal left basilar atelectasis, unchanged. No superimposed confluent pulmonary infiltrate. Electronically Signed   By: Fidela Salisbury MD   On: 09/24/2019 20:15   ECHOCARDIOGRAM COMPLETE  Result Date: 09/30/2019    ECHOCARDIOGRAM REPORT   Patient  Name:   Triton D Bonsell Date of Exam: 09/30/2019 Medical Rec #:  [id number]         Height:       72.0 in Accession #:    [id number]        Weight:       237.0 lb  Date of Birth:  [birth year]        BSA:          2.290 m Patient Age:    40 years          BP:           95/60 mmHg Patient Gender: M                 HR:           126 bpm. Exam Location:  [facility] Procedure: 2D Echo Indications:    Endocarditis I38  History:        Patient has no prior history of Echocardiogram examinations.                 Risk Factors:Non-Smoker. Pneumonia due to COVID-19 virus,                 Sepsis, Lactic Acidosis.  Sonographer:    Leavy Cella RDCS (AE) Referring Phys: [id number] OLADAPO ADEFESO IMPRESSIONS  1. Left ventricular ejection fraction, by estimation, is 55 to 60%. The left ventricle has normal function. The left ventricle has no regional wall motion abnormalities. There is moderate left ventricular hypertrophy. Left ventricular diastolic parameters are indeterminate.  2. Right ventricular systolic function is normal. The right ventricular size is normal. Tricuspid regurgitation signal is inadequate for assessing PA pressure.  3. The mitral valve is grossly normal. No evidence of mitral valve regurgitation.  4. The aortic valve is tricuspid. Aortic valve regurgitation is not visualized.  5. The inferior vena cava is normal in size with <50% respiratory variability, suggesting right atrial pressure of 8 mmHg.  6. Views are somewhat limited, but no definite valvular vegetations are visualized. FINDINGS  Left Ventricle: Left ventricular ejection fraction, by estimation, is 55 to 60%. The left ventricle has normal function. The left ventricle has no regional wall motion abnormalities. The left ventricular internal cavity size was normal in size. There is  moderate left ventricular hypertrophy. Left ventricular diastolic parameters are indeterminate. Right Ventricle: The right ventricular size is normal. No increase in right ventricular wall thickness. Right ventricular systolic function is normal. Tricuspid regurgitation signal is inadequate for assessing PA pressure. Left Atrium:  Left atrial size was normal in size. Right Atrium: Right atrial size was normal in size. Pericardium: There is no evidence of pericardial effusion. Mitral Valve: The mitral valve is grossly normal. No evidence of mitral valve regurgitation. Tricuspid Valve: The tricuspid valve is grossly normal. Tricuspid valve regurgitation is trivial. Aortic Valve: The aortic valve is tricuspid. Aortic valve regurgitation is not visualized. Pulmonic Valve: The pulmonic valve was grossly normal. Pulmonic valve regurgitation is trivial. Aorta: The aortic root is normal in size and structure. Venous: The inferior vena cava is normal in size with less than 50% respiratory variability, suggesting right atrial pressure of 8 mmHg. IAS/Shunts: No atrial level shunt detected by color flow Doppler.  LEFT VENTRICLE PLAX 2D LVIDd:         4.37 cm Diastology LVIDs:         2.20 cm LV e' medial:    7.18 cm/s LV PW:  1.72 cm LV E/e' medial:  9.2 LV IVS:        1.46 cm LV e' lateral:   12.90 cm/s                        LV E/e' lateral: 5.1  RIGHT VENTRICLE RV S prime:     16.00 cm/s TAPSE (M-mode): 1.5 cm LEFT ATRIUM             Index       RIGHT ATRIUM           Index LA diam:        3.70 cm 1.62 cm/m  RA Area:     15.40 cm LA Vol (A2C):   63.9 ml 27.90 ml/m RA Volume:   39.80 ml  17.38 ml/m LA Vol (A4C):   40.6 ml 17.73 ml/m LA Biplane Vol: 52.4 ml 22.88 ml/m   AORTA Ao Root diam: 3.50 cm MITRAL VALVE MV Area (PHT): 6.60 cm MV Decel Time: 115 msec MV E velocity: 66.00 cm/s MV A velocity: 39.80 cm/s MV E/A ratio:  1.66 Rozann Lesches MD Electronically signed by Rozann Lesches MD Signature Date/Time: 09/30/2019/4:58:16 PM    Final    [place] EKG SITE RITE  Result Date: 10/13/2019 If Site Rite image not attached, placement could not be confirmed due to current cardiac rhythm.   Kathie Dike, MD  [facility]  If 7PM-7AM, please contact night-coverage [email or web address]  10/24/2019, 7:20 PM   LOS: 26 days

## 2019-10-24 NOTE — Progress Notes (Signed)
CRITICAL VALUE ALERT  Critical Value:  Hemoglobin 5.8  Date & Time Notied:  10/4 @ 1164  Provider Notified: Dr. Roderic Palau  Orders Received/Actions taken: awaiting new orders

## 2019-10-25 ENCOUNTER — Inpatient Hospital Stay (HOSPITAL_COMMUNITY)
Admission: RE | Admit: 2019-10-25 | Discharge: 2019-10-26 | DRG: 951 | Disposition: A | Discharge status: Hospice | Attending: Internal Medicine | Admitting: Internal Medicine

## 2019-10-25 DIAGNOSIS — Z66 Do not resuscitate: Secondary | ICD-10-CM | POA: Diagnosis present

## 2019-10-25 DIAGNOSIS — Z8616 Personal history of COVID-19: Secondary | ICD-10-CM

## 2019-10-25 DIAGNOSIS — C787 Secondary malignant neoplasm of liver and intrahepatic bile duct: Secondary | ICD-10-CM | POA: Diagnosis present

## 2019-10-25 DIAGNOSIS — K922 Gastrointestinal hemorrhage, unspecified: Secondary | ICD-10-CM | POA: Diagnosis present

## 2019-10-25 DIAGNOSIS — D500 Iron deficiency anemia secondary to blood loss (chronic): Secondary | ICD-10-CM | POA: Diagnosis present

## 2019-10-25 DIAGNOSIS — R739 Hyperglycemia, unspecified: Secondary | ICD-10-CM | POA: Diagnosis present

## 2019-10-25 DIAGNOSIS — C169 Malignant neoplasm of stomach, unspecified: Secondary | ICD-10-CM | POA: Diagnosis present

## 2019-10-25 DIAGNOSIS — Z20822 Contact with and (suspected) exposure to covid-19: Secondary | ICD-10-CM | POA: Diagnosis present

## 2019-10-25 DIAGNOSIS — I4891 Unspecified atrial fibrillation: Secondary | ICD-10-CM | POA: Diagnosis present

## 2019-10-25 DIAGNOSIS — Z515 Encounter for palliative care: Secondary | ICD-10-CM | POA: Diagnosis present

## 2019-10-25 LAB — BPAM RBC
Blood Product Expiration Date: 202110072359
Blood Product Expiration Date: 202110282359
ISSUE DATE / TIME: 202110041042
ISSUE DATE / TIME: 202110042246
Unit Type and Rh: 6200
Unit Type and Rh: 6200

## 2019-10-25 LAB — GLUCOSE, CAPILLARY
Glucose-Capillary: 139 mg/dL — ABNORMAL HIGH (ref 70–99)
Glucose-Capillary: 232 mg/dL — ABNORMAL HIGH (ref 70–99)

## 2019-10-25 LAB — TYPE AND SCREEN
ABO/RH(D): A POS
Antibody Screen: POSITIVE
DAT, IgG: NEGATIVE
Unit division: 0
Unit division: 0

## 2019-10-25 LAB — HEMOGLOBIN AND HEMATOCRIT, BLOOD
HCT: 25.7 % — ABNORMAL LOW (ref 39.0–52.0)
Hemoglobin: 7.8 g/dL — ABNORMAL LOW (ref 13.0–17.0)

## 2019-10-25 MED ORDER — ALUM & MAG HYDROXIDE-SIMETH 200-200-20 MG/5ML PO SUSP
30.0000 mL | ORAL | Status: DC | PRN
  Administered 2019-10-25 – 2019-10-26 (×2): 30 mL via ORAL
  Filled 2019-10-25 (×2): qty 30

## 2019-10-25 MED ORDER — ONDANSETRON HCL 4 MG/2ML IJ SOLN: 4.0000 mg | Freq: Four times a day (QID) | INTRAMUSCULAR | Status: DC | PRN

## 2019-10-25 MED ORDER — DILTIAZEM HCL ER COATED BEADS 240 MG PO CP24
240.0000 mg | ORAL_CAPSULE | Freq: Every day | ORAL | Status: DC
  Administered 2019-10-26: 240 mg via ORAL
  Filled 2019-10-25: qty 1

## 2019-10-25 MED ORDER — HALOPERIDOL 0.5 MG PO TABS: 0.5000 mg | ORAL_TABLET | ORAL | Status: DC | PRN

## 2019-10-25 MED ORDER — METOPROLOL TARTRATE 25 MG PO TABS
12.5000 mg | ORAL_TABLET | Freq: Two times a day (BID) | ORAL | Status: DC
  Administered 2019-10-25 – 2019-10-26 (×2): 12.5 mg via ORAL
  Filled 2019-10-25 (×2): qty 1

## 2019-10-25 MED ORDER — MORPHINE SULFATE (PF) 2 MG/ML IV SOLN: 1.0000 mg | INTRAVENOUS | Status: DC | PRN

## 2019-10-25 MED ORDER — BUSPIRONE HCL 5 MG PO TABS: 5.0000 mg | ORAL_TABLET | Freq: Three times a day (TID) | ORAL | Status: DC

## 2019-10-25 MED ORDER — PANTOPRAZOLE SODIUM 40 MG PO TBEC: 40.0000 mg | DELAYED_RELEASE_TABLET | Freq: Two times a day (BID) | ORAL | Status: AC

## 2019-10-25 MED ORDER — TAMSULOSIN HCL 0.4 MG PO CAPS: 0.4000 mg | ORAL_CAPSULE | Freq: Every day | ORAL | Status: AC

## 2019-10-25 MED ORDER — ACETAMINOPHEN 325 MG PO TABS: 650.0000 mg | ORAL_TABLET | Freq: Four times a day (QID) | ORAL | Status: DC | PRN

## 2019-10-25 MED ORDER — GLYCOPYRROLATE 0.2 MG/ML IJ SOLN: 0.2000 mg | INTRAMUSCULAR | Status: DC | PRN

## 2019-10-25 MED ORDER — MIRTAZAPINE 15 MG PO TABS: 15.0000 mg | ORAL_TABLET | Freq: Every day | ORAL | Status: DC

## 2019-10-25 MED ORDER — ONDANSETRON 4 MG PO TBDP: 4.0000 mg | ORAL_TABLET | Freq: Four times a day (QID) | ORAL | Status: DC | PRN

## 2019-10-25 MED ORDER — BIOTENE DRY MOUTH MT LIQD: 15.0000 mL | OROMUCOSAL | Status: DC | PRN

## 2019-10-25 MED ORDER — ACETAMINOPHEN 650 MG RE SUPP: 650.0000 mg | Freq: Four times a day (QID) | RECTAL | Status: DC | PRN

## 2019-10-25 MED ORDER — HALOPERIDOL LACTATE 5 MG/ML IJ SOLN: 0.5000 mg | INTRAMUSCULAR | Status: DC | PRN

## 2019-10-25 MED ORDER — HALOPERIDOL LACTATE 2 MG/ML PO CONC: 0.5000 mg | ORAL | Status: DC | PRN

## 2019-10-25 MED ORDER — MIRTAZAPINE 15 MG PO TABS
15.0000 mg | ORAL_TABLET | Freq: Every day | ORAL | Status: DC
  Administered 2019-10-25: 15 mg via ORAL
  Filled 2019-10-25: qty 1

## 2019-10-25 MED ORDER — DIGOXIN 125 MCG PO TABS: 0.1250 mg | ORAL_TABLET | Freq: Every day | ORAL | Status: AC

## 2019-10-25 MED ORDER — PANTOPRAZOLE SODIUM 40 MG PO TBEC
40.0000 mg | DELAYED_RELEASE_TABLET | Freq: Two times a day (BID) | ORAL | Status: DC
  Administered 2019-10-25 – 2019-10-26 (×2): 40 mg via ORAL
  Filled 2019-10-25 (×2): qty 1

## 2019-10-25 MED ORDER — POLYVINYL ALCOHOL 1.4 % OP SOLN: 1.0000 [drp] | Freq: Four times a day (QID) | OPHTHALMIC | Status: DC | PRN

## 2019-10-25 MED ORDER — DIGOXIN 125 MCG PO TABS
0.1250 mg | ORAL_TABLET | Freq: Every day | ORAL | Status: DC
  Administered 2019-10-26: 0.125 mg via ORAL
  Filled 2019-10-25: qty 1

## 2019-10-25 MED ORDER — METOPROLOL TARTRATE 25 MG PO TABS: 12.5000 mg | ORAL_TABLET | Freq: Two times a day (BID) | ORAL | Status: AC

## 2019-10-25 MED ORDER — TAMSULOSIN HCL 0.4 MG PO CAPS
0.4000 mg | ORAL_CAPSULE | Freq: Every day | ORAL | Status: DC
  Administered 2019-10-25: 0.4 mg via ORAL
  Filled 2019-10-25: qty 1

## 2019-10-25 MED ORDER — DILTIAZEM HCL ER COATED BEADS 240 MG PO CP24: 240.0000 mg | ORAL_CAPSULE | Freq: Every day | ORAL | Status: AC

## 2019-10-25 MED ORDER — BUSPIRONE HCL 5 MG PO TABS
5.0000 mg | ORAL_TABLET | Freq: Three times a day (TID) | ORAL | Status: DC
  Administered 2019-10-25 – 2019-10-26 (×3): 5 mg via ORAL
  Filled 2019-10-25 (×3): qty 1

## 2019-10-25 MED ORDER — GLYCOPYRROLATE 1 MG PO TABS: 1.0000 mg | ORAL_TABLET | ORAL | Status: DC | PRN

## 2019-10-25 NOTE — Progress Notes (Signed)
Pt c/o upset stomach, Mylanta given.

## 2019-10-25 NOTE — H&P (Addendum)
History and Physical    REBEL WILLCUTT YSA:[id number] DOB: Oct 23, 1955 DOA: 10/25/2019  PCP: Jake Samples, PA-C  Patient coming from: home  I have personally briefly reviewed patient's old medical records in [place]  Chief Complaint: Admitted under GIP status, awaiting bed at residential hospice  HPI: Stephen Wilcox is a 64 y.o. male was admitted to the hospital with COVID-19 pneumonia and respiratory failure.  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  He has been transitioned to residential hospice and is awaiting a bed.  Please refer to discharge summary done on 10/5 for further details   No past medical history on file.  Past Surgical History:  Procedure Laterality Date  . ANKLE SURGERY     2010  . BIOPSY  10/10/2019   Procedure: BIOPSY;  Surgeon: Rogene Houston, MD;  Location: [facility];  Service: Endoscopy;;  . ESOPHAGOGASTRODUODENOSCOPY (EGD) WITH PROPOFOL N/A 10/10/2019   Procedure: ESOPHAGOGASTRODUODENOSCOPY (EGD) WITH PROPOFOL;  Surgeon: Rogene Houston, MD;  Location: [facility];  Service: Endoscopy;  Laterality: N/A;    Social History:  reports that he has never smoked. He has never used smokeless tobacco. He reports that he does not drink alcohol and does not use drugs.  No Known Allergies  Family history: Family history reviewed and not pertinent  Prior to Admission medications   Medication Sig Start Date End Date Taking? Authorizing Provider  busPIRone (BUSPAR) 5 MG tablet Take 1 tablet (5 mg total) by mouth 3 (three) times daily. 10/25/19  Yes Kathie Dike, MD  digoxin (LANOXIN) 0.125 MG tablet Take 1 tablet (0.125 mg total) by mouth daily. 10/26/19  Yes Kathie Dike, MD  diltiazem (CARDIZEM CD) 240 MG 24 hr capsule Take 1 capsule (240 mg total) by mouth daily. 10/26/19  Yes Kathie Dike, MD  metoprolol tartrate (LOPRESSOR) 25 MG tablet Take 0.5  tablets (12.5 mg total) by mouth 2 (two) times daily. 10/25/19  Yes Kathie Dike, MD  mirtazapine (REMERON) 15 MG tablet Take 1 tablet (15 mg total) by mouth at bedtime. 10/25/19  Yes Kathie Dike, MD  pantoprazole (PROTONIX) 40 MG tablet Take 1 tablet (40 mg total) by mouth 2 (two) times daily before a meal. 10/25/19  Yes Patty Leitzke, Jolaine Artist, MD  tamsulosin (FLOMAX) 0.4 MG CAPS capsule Take 1 capsule (0.4 mg total) by mouth daily after supper. 10/25/19  Yes Kathie Dike, MD  vitamin C (ASCORBIC ACID) 250 MG tablet Take 500 mg by mouth daily.   Yes [provider]    Physical Exam: There were no vitals filed for this visit.  Constitutional: NAD, calm, comfortable Eyes: PERRL, lids and conjunctivae normal ENMT: Mucous membranes are moist. Posterior pharynx clear of any exudate or lesions.Normal dentition.  Neck: normal, supple, no masses, no thyromegaly Respiratory: clear to auscultation bilaterally, no wheezing, no crackles. Normal respiratory effort. No accessory muscle use.  Cardiovascular: Regular rate and rhythm, no murmurs / rubs / gallops.  2+ pedal pulses. No carotid bruits.     Labs on Admission: I have personally reviewed following labs and imaging studies  CBC: Recent Labs  Lab 10/21/19 0851 10/22/19 0710 10/24/19 0636 10/25/19 0456  WBC 6.2 7.2 9.6  --   HGB 8.2* 8.1* 5.8* 7.8*  HCT 27.0* 27.1* 20.2* 25.7*  MCV 82.1 81.6 84.2  --   PLT 175 274 420*  --    Basic Metabolic Panel: Recent Labs  Lab  10/21/19 0851 10/22/19 0710 10/24/19 0636  NA 133* 135 133*  K 3.4* 4.5 3.9  CL 94* 98 95*  CO2 31 30 28   GLUCOSE 167* 166* 180*  BUN 18 26* 30*  CREATININE 0.61 0.54* 0.64  CALCIUM 7.8* 7.6* 8.0*  PHOS  --  2.7  --    GFR: Estimated Creatinine Clearance: 122.3 mL/min (by C-G formula based on SCr of 0.64 mg/dL). Liver Function Tests: Recent Labs  Lab 10/21/19 0851 10/22/19 0710 10/24/19 0636  AST 21  --  24  ALT 43  --  31  ALKPHOS 137*  --  118   BILITOT 0.3  --  0.4  PROT 4.7*  --  4.2*  ALBUMIN 1.9* 1.9* 2.0*   No results for input(s): LIPASE, AMYLASE in the last 168 hours. No results for input(s): AMMONIA in the last 168 hours. Coagulation Profile: No results for input(s): INR, PROTIME in the last 168 hours. Cardiac Enzymes: No results for input(s): CKTOTAL, CKMB, CKMBINDEX, TROPONINI in the last 168 hours. BNP (last 3 results) No results for input(s): PROBNP in the last 8760 hours. HbA1C: No results for input(s): HGBA1C in the last 72 hours. CBG: Recent Labs  Lab 10/24/19 1116 10/24/19 1657 10/24/19 2219 10/25/19 0725 10/25/19 1150  GLUCAP 190* 172* 150* 139* 232*   Lipid Profile: No results for input(s): CHOL, HDL, LDLCALC, TRIG, CHOLHDL, LDLDIRECT in the last 72 hours. Thyroid Function Tests: No results for input(s): TSH, T4TOTAL, FREET4, T3FREE, THYROIDAB in the last 72 hours. Anemia Panel: No results for input(s): VITAMINB12, FOLATE, FERRITIN, TIBC, IRON, RETICCTPCT in the last 72 hours. Urine analysis:    Component Value Date/Time   COLORURINE YELLOW 10/12/2019 1544   APPEARANCEUR HAZY (A) 10/12/2019 1544   LABSPEC 1.014 10/12/2019 1544   PHURINE 5.0 10/12/2019 1544   GLUCOSEU NEGATIVE 10/12/2019 1544   HGBUR MODERATE (A) 10/12/2019 1544   BILIRUBINUR NEGATIVE 10/12/2019 1544   KETONESUR NEGATIVE 10/12/2019 1544   PROTEINUR NEGATIVE 10/12/2019 1544   NITRITE NEGATIVE 10/12/2019 1544   LEUKOCYTESUR NEGATIVE 10/12/2019 1544    Radiological Exams on Admission: No results found.    Assessment/Plan Active Problems:   Cancer, metastatic to liver [facility])     Assessment/plan:  64 year old male who was recently admitted to the hospital with COVID-19 pneumonia and respiratory failure, has had a complicated hospitalization.  Was also noted to be anemic on admission and further work-up revealed that he had metastatic gastric adenocarcinoma and associated chronic blood loss anemia.  After several  conversations with hospice, decision was made to pursue comfort care.  He is awaiting residential hospice placement and is under GIP status.  Please refer to discharge summary done on 10/5 for further details   Kathie Dike MD [facility]   If 7PM-7AM, please contact night-coverage [email or web address]   10/25/2019, 9:11 PM

## 2019-10-25 NOTE — Progress Notes (Signed)
UNMATCHED BLOOD PRODUCT NOTE  Compare the patient ID on the blood tag to the patient ID on the hospital armband and Blood Bank armband. Then confirm the unit number on the blood tag matches the unit number on the blood product.  If a discrepancy is discovered return the product to blood bank immediately.   Blood Product Type: Packed Red Blood Cells  Unit #:  [id number]  Product Code #: [id number]   Start Time: 7340  Starting Rate: 120 ml/hr  Rate increase/decreased  (if applicable): 370      ml/hr  Rate changed time (if applicable): 9643   Stop Time: 8381   All Other Documentation should be documented within the Blood Admin Flowsheet per policy.

## 2019-10-25 NOTE — Discharge Summary (Signed)
Physician Discharge Summary  Stephen Wilcox INO:[id number] DOB: 03-25-1955 DOA: 09/28/2019  PCP: Jake Samples, PA-C  Admit date: 09/28/2019 Discharge date: 10/25/2019  Admitted From: Home Disposition: Residential hospice/GIP  Recommendations for Outpatient Follow-up:  1. Patient is being discharged to residential hospice for end-of-life care 2. Currently under GIP status  Discharge Condition: Hospice CODE STATUS: DNR, comfort measures Diet recommendation: Soft diet as tolerated  Brief/Interim Summary: 64 year old male presented to the emergency room with lightheadedness, malaise and generalized weakness.  He also had cough, shortness of breath.  He was noted to be hypoxic on room air and tested positive for COVID-19.  He was treated with remdesivir and steroids.  He was noted to be anemic on admission with a hemoglobin of 7.3.  He was transfused PRBCs and was also noted to be Hemoccult positive.  GI was consulted and patient underwent endoscopic evaluation.  He was noted to have friable gastric mass in his stomach extending up into his esophagus.  Further imaging also confirmed hepatic metastases.  Biopsies taken during endoscopy confirmed adenocarcinoma.  Plans were for outpatient follow-up with oncology  Unfortunately, hospital course complicated by development of severe sepsis with septic shock.  He was found to have E. coli bacteremia/sepsis.  Patient was monitored in the ICU where he required vasopressor support.  Urine culture also positive for E. coli.  He was treated with intravenous antibiotics and has since stabilized.  His overall prognosis has been poor.  With his friable gastric mass, he is prone to continue to have chronic GI blood loss.  He has poor p.o. intake with low albumin.  He is nonambulatory at this time.  After several discussions with the patient with palliative care, it was decided to pursue comfort measures/hospice.  Patient has been accepted to residential  hospice for end-of-life care.  He is currently GIP status and can be transition to residential hospice once a bed becomes available.  Discharge Diagnoses:  Principal Problem:   Severe sepsis with septic shock-Severe Sepsis with Septic Shock Secondary to E. coli infection with Acute Hypotension/Elevated Lactic Acid/Leukocytosis Active Problems:   Pneumonia due to COVID-19 virus   Acute respiratory failure with hypoxia (HCC)   Sepsis (HCC)   Hyperglycemia   Thrombocytosis   Hyponatremia   Hypokalemia   Microcytic anemia   GI bleed   Symptomatic anemia   Lactic acidosis   Acute respiratory disease due to COVID-19 virus   Atrial fibrillation with RVR (HCC)   Liver masses   Cancer, metastatic to [facility])   Goals of care, counseling/discussion   Palliative care by specialist    Discharge Instructions  Discharge Instructions    Diet - low sodium heart healthy   Complete by: As directed    Increase activity slowly   Complete by: As directed      Allergies as of 10/25/2019   No Known Allergies     Medication List    STOP taking these medications   ciprofloxacin 500 MG tablet Commonly known as: CIPRO   ICAPS PO   multivitamin with minerals tablet   ZINC PO     TAKE these medications   busPIRone 5 MG tablet Commonly known as: BUSPAR Take 1 tablet (5 mg total) by mouth 3 (three) times daily.   digoxin 0.125 MG tablet Commonly known as: LANOXIN Take 1 tablet (0.125 mg total) by mouth daily. Start taking on: October 26, 2019   diltiazem 240 MG 24 hr capsule Commonly known as: CARDIZEM CD Take  1 capsule (240 mg total) by mouth daily. Start taking on: October 26, 2019   metoprolol tartrate 25 MG tablet Commonly known as: LOPRESSOR Take 0.5 tablets (12.5 mg total) by mouth 2 (two) times daily.   mirtazapine 15 MG tablet Commonly known as: REMERON Take 1 tablet (15 mg total) by mouth at bedtime.   pantoprazole 40 MG tablet Commonly known as: PROTONIX Take 1  tablet (40 mg total) by mouth 2 (two) times daily before a meal.   tamsulosin 0.4 MG Caps capsule Commonly known as: FLOMAX Take 1 capsule (0.4 mg total) by mouth daily after supper. What changed: when to take this       Contact information for after-discharge care    [facility] .   Service: Skilled Nursing Contact information: [address] [place] [phone]                 No Known Allergies  Consultations:  Palliative care  Gastroenterology   Procedures/Studies: CT ANGIO CHEST PE W OR WO CONTRAST  Result Date: 10/03/2019 CLINICAL DATA:  COVID-19 positive, weakness, dizziness and shortness of breath EXAM: CT ANGIOGRAPHY CHEST WITH CONTRAST TECHNIQUE: Multidetector CT imaging of the chest was performed using the standard protocol during bolus administration of intravenous contrast. Multiplanar CT image reconstructions and MIPs were obtained to evaluate the vascular anatomy. CONTRAST:  160mL OMNIPAQUE IOHEXOL 350 MG/ML SOLN COMPARISON:  Radiograph 09/28/2019, CT abdomen pelvis 08/15/2018 FINDINGS: Cardiovascular: Satisfactory opacification the pulmonary arteries to the segmental level. No pulmonary artery filling defects are identified. Central pulmonary arteries are normal caliber. Suboptimal opacification of the thoracic aorta. No discernible acute abnormality of the thoracic aorta proximal great vessels. Aberrant right subclavian artery. Borderline cardiac enlargement. Slight reflux of contrast into the IVC. Mediastinum/Nodes: No mediastinal fluid or gas. Normal thyroid gland and thoracic inlet. No acute abnormality of the trachea or esophagus. No worrisome mediastinal, hilar or axillary adenopathy. Lungs/Pleura: Mixed areas of heterogeneous consolidative and ground-glass opacity throughout both lungs with a basilar and peripheral predominance. No pneumothorax or visible effusion. Diffuse airways thickening.  Upper Abdomen: Indeterminate intermediate attenuation (38 HU) lesion measuring up to 8.5 cm in maximum transaxial dimension within the right lobe liver (4/80). Cholelithiasis with a partially calcified gallstone towards the gallbladder neck. Multiple soft tissue attenuation lesions present along the lesser curvature/gastrohepatic ligament. Largest measuring up to 4.6 cm in size (4/91). Partially exophytic 0.7 cm lesion arising from the upper pole left kidney, corresponding with a previously seen fluid attenuation cyst on prior comparison CT, could reflect intracystic hemorrhage/proteinaceous debris. Musculoskeletal: Multilevel degenerative changes are present in the imaged portions of the spine. No acute osseous abnormality or suspicious osseous lesion. Probable intramuscular lipoma of the right infraspinatus measuring up to 6.9 by 3.5 cm (4/20). No other acute or worrisome chest wall lesions. Review of the MIP images confirms the above findings. IMPRESSION: 1. No evidence of acute pulmonary artery filling defects. 2. Mixed areas of heterogeneous consolidative and ground-glass opacity throughout both lungs with a basilar and peripheral predominance. Findings are compatible with multifocal pneumonia compatible with a COVID-19 etiology. 3. Indeterminate 4.8 cm lesion in the right lobe liver, incompletely characterized. Additional multiple soft tissue attenuation lesions along the lesser curvature/gastrohepatic ligament, largest measuring up to 4.6 cm in size. These findings are new from comparison abdominal CT 08/15/2018 and could raise concern for potential malignancy. Could consider further evaluation with abdominal MR with contrast. 4. Additional 7 mm  hyperdense lesion arising from the upper pole left kidney, could reflect proteinaceous cysts, Ob also be better evaluated on dedicated abdominal imaging. 5. Cholelithiasis. 6. Aberrant right subclavian artery. These results will be called to the ordering clinician or  representative by the Radiologist Assistant, and communication documented in the PACS or [employer]. Electronically Signed   By: Lovena Le M.D.   On: 10/03/2019 15:38   CT ABDOMEN PELVIS W CONTRAST  Result Date: 10/11/2019 CLINICAL DATA:  COVID positive. EXAM: CT ABDOMEN AND PELVIS WITH CONTRAST TECHNIQUE: Multidetector CT imaging of the abdomen and pelvis was performed using the standard protocol following bolus administration of intravenous contrast. CONTRAST:  55mL OMNIPAQUE IOHEXOL 300 MG/ML  SOLN COMPARISON:  August 15, 2018 FINDINGS: Lower chest: Marked severity bilateral lower lobe infiltrates are seen. Hepatobiliary: Multiple large heterogeneous low-attenuation liver masses are seen. The largest measures approximately 6.0 cm x 5.3 cm x 6.1 cm. A 1.7 cm gallstone is seen within the neck of an otherwise normal-appearing gallbladder. Pancreas: Unremarkable. No pancreatic ductal dilatation or surrounding inflammatory changes. Spleen: Normal in size without focal abnormality. Adrenals/Urinary Tract: Adrenal glands are unremarkable. Kidneys are normal in size, without renal calculi or hydronephrosis. A stable 1.2 cm diameter area of heterogeneous low attenuation is seen within the medial aspect of the mid to lower right kidney. A stable 1.2 cm cyst is seen along the posterior aspect of the upper pole of the left kidney. Bladder is unremarkable. Stomach/Bowel: A 3.0 cm x 8.7 cm x 6.3 cm heterogeneous soft tissue mass is seen extending from the wall of the lesser sac of the stomach into the gastric lumen (axial CT images 15 through 25, CT series number 2). Appendix appears normal. There is no evidence of bowel dilatation. Numerous diverticula are seen throughout the descending and sigmoid colon. Moderate severity thickening of the proximal sigmoid colon is noted (best seen on coronal reformatted images 43 through 50, CT series number 5). Vascular/Lymphatic: No significant vascular findings are present.  Reproductive: The prostate gland is markedly enlarged. Other: Multiple round heterogeneous soft tissue masses of various sizes are seen within the upper abdomen, adjacent to the lesser sac of the stomach. The largest measures approximately 5.2 cm x 5.6 cm x 4.6 cm. Musculoskeletal: Multilevel degenerative changes are seen throughout the lumbar spine. IMPRESSION: 1. Marked severity bilateral lower lobe infiltrates. 2. Multiple large heterogeneous low-attenuation liver masses, consistent with metastatic disease. 3. Large gastric mass, suspicious for primary neoplasm. 4. Multiple soft tissue masses within the mesentery of the upper abdomen, adjacent to the lesser sac of the stomach, consistent with metastatic disease. 5. Colonic diverticulosis. 6. Thickening of the proximal sigmoid colon which may represent sequelae associated with mild colitis/diverticulitis correlation with follow-up abdomen pelvis CT is recommended to exclude the presence of an underlying neoplasm. 7. Stable area of low attenuation within the right kidney. While this may represent a hemorrhagic cyst, correlation with renal ultrasound is recommended. 8. Cholelithiasis. Electronically Signed   By: Virgina Norfolk M.D.   On: 10/11/2019 23:24   MR LIVER W WO CONTRAST  Result Date: 10/04/2019 CLINICAL DATA:  Evaluate liver lesions and upper abdominal adenopathy seen on recent chest CT. EXAM: MRI ABDOMEN WITHOUT AND WITH CONTRAST TECHNIQUE: Multiplanar multisequence MR imaging of the abdomen was performed both before and after the administration of intravenous contrast. CONTRAST:  50mL GADAVIST GADOBUTROL 1 MMOL/ML IV SOLN COMPARISON:  CT abdomen/pelvis 08/15/2018 FINDINGS: Lower chest: The lungs demonstrate changes of COVID pneumonia as demonstrated on today's chest  CT. Hepatobiliary: Numerous diffusion positive hepatic metastatic lesions are noted. Segment 4A lesion measures 3.7 cm on image 7/8. 5 cm lesion and segment 7 on image 12/8. 5 cm lesion  in segment 6 on image 18/8. Several other smaller lesions are noted. No intrahepatic biliary dilatation. There is diffuse fatty infiltration of the liver noted. A 2 cm gallstone is noted in the gallbladder. No common bile duct dilatation. Pancreas:  No mass, inflammation or ductal dilatation. Spleen:  Normal size. No focal lesions. Adrenals/Urinary Tract: The adrenal glands and kidneys are unremarkable. Stomach/Bowel: There is a large infiltrating gastric mass involving the fundal region 6.5 x 4.8 cm and is diffusion positive. Moderate contrast enhancement is noted. Adjacent gastrohepatic ligament lymphadenopathy with the largest node measuring 4.6 cm. Vascular/Lymphatic: The aorta is normal in caliber. No dissection. The branch vessels are patent. No retroperitoneal lymphadenopathy. Other:  No ascites or abdominal wall hernia. Musculoskeletal: No worrisome bone lesions. IMPRESSION: 1. 6.5 x 4.8 cm infiltrating gastric mass involving the fundal region. Associated gastrohepatic ligament lymphadenopathy. 2. Numerous hepatic metastatic lesions. 3. Cholelithiasis. Electronically Signed   By: Marijo Sanes M.D.   On: 10/04/2019 06:26   US RENAL  Result Date: 10/13/2019 CLINICAL DATA:  Acute kidney injury, recent discovery of abdominal and liver masses. EXAM: RENAL / URINARY TRACT ULTRASOUND COMPLETE COMPARISON:  MRI 10/04/2019 and CT 10/11/2019 FINDINGS: Right Kidney: Renal measurements: 12.9 x 5.6 x 5.7 cm = volume: 213 mL. Echogenicity within normal limits. No mass or hydronephrosis visualized. Left Kidney: Renal measurements: 14 x 6.3 x 5.6 cm = volume: 258 mL. Echogenicity within normal limits. No mass or hydronephrosis visualized. Bladder: Foley catheter in the urinary bladder. Other: Incidental liver masses in the RIGHT hepatic lobe measuring 4.3 x 3.4 x 3.6 cm and 6.3 x 4.4 x 5.6 cm, grossly similar accounting for differences in angle of measurement and technique as compared to recent imaging studies. One of  these measurements may combine the dimensions of 2 adjacent masses, would refer to previous CT and MRI for follow-up measurements. IMPRESSION: 1. No hydronephrosis. 2. Hepatic metastatic disease better visualized on recent CTs. Electronically Signed   By: Zetta Bills M.D.   On: 10/13/2019 14:20   DG CHEST PORT 1 VIEW  Result Date: 10/13/2019 CLINICAL DATA:  History of COVID-19 positivity, status post left PICC line placement EXAM: PORTABLE CHEST 1 VIEW COMPARISON:  09/28/2019 FINDINGS: Cardiac shadow is stable. Patchy airspace opacities are noted in both lungs increased from the prior exam particularly in the left retrocardiac region consistent with progressive COVID-19 pneumonia. Left-sided PICC line is seen with the catheter tip at the cavoatrial junction. No bony abnormality is noted. IMPRESSION: PICC line in satisfactory position. Increase in the degree of bilateral patchy airspace opacity consistent with COVID-19 positivity. Electronically Signed   By: Inez Catalina M.D.   On: 10/13/2019 20:06   DG Chest Port 1 View  Result Date: 09/28/2019 CLINICAL DATA:  COVID EXAM: PORTABLE CHEST 1 VIEW COMPARISON:  09/24/2019 FINDINGS: Development of patchy bilateral airspace opacities. No pleural effusion. Stable cardiomediastinal silhouette. No pneumothorax. IMPRESSION: Development of patchy bilateral airspace opacities, consistent with bilateral pneumonia. Electronically Signed   By: Donavan Foil M.D.   On: 09/28/2019 23:14   ECHOCARDIOGRAM COMPLETE  Result Date: 09/30/2019    ECHOCARDIOGRAM REPORT   Patient Name:   Stephen Wilcox Date of Exam: 09/30/2019 Medical Rec #:  [id number]         Height:       72.0  in Accession #:    [id number]        Weight:       237.0 lb Date of Birth:  1955/11/01        BSA:          2.290 m Patient Age:    53 years          BP:           95/60 mmHg Patient Gender: M                 HR:           126 bpm. Exam Location:  [facility] Procedure: 2D Echo Indications:     Endocarditis I38  History:        Patient has no prior history of Echocardiogram examinations.                 Risk Factors:Non-Smoker. Pneumonia due to COVID-19 virus,                 Sepsis, Lactic Acidosis.  Sonographer:    Leavy Cella RDCS (AE) Referring Phys: [id number] OLADAPO ADEFESO IMPRESSIONS  1. Left ventricular ejection fraction, by estimation, is 55 to 60%. The left ventricle has normal function. The left ventricle has no regional wall motion abnormalities. There is moderate left ventricular hypertrophy. Left ventricular diastolic parameters are indeterminate.  2. Right ventricular systolic function is normal. The right ventricular size is normal. Tricuspid regurgitation signal is inadequate for assessing PA pressure.  3. The mitral valve is grossly normal. No evidence of mitral valve regurgitation.  4. The aortic valve is tricuspid. Aortic valve regurgitation is not visualized.  5. The inferior vena cava is normal in size with <50% respiratory variability, suggesting right atrial pressure of 8 mmHg.  6. Views are somewhat limited, but no definite valvular vegetations are visualized. FINDINGS  Left Ventricle: Left ventricular ejection fraction, by estimation, is 55 to 60%. The left ventricle has normal function. The left ventricle has no regional wall motion abnormalities. The left ventricular internal cavity size was normal in size. There is  moderate left ventricular hypertrophy. Left ventricular diastolic parameters are indeterminate. Right Ventricle: The right ventricular size is normal. No increase in right ventricular wall thickness. Right ventricular systolic function is normal. Tricuspid regurgitation signal is inadequate for assessing PA pressure. Left Atrium: Left atrial size was normal in size. Right Atrium: Right atrial size was normal in size. Pericardium: There is no evidence of pericardial effusion. Mitral Valve: The mitral valve is grossly normal. No evidence of mitral valve  regurgitation. Tricuspid Valve: The tricuspid valve is grossly normal. Tricuspid valve regurgitation is trivial. Aortic Valve: The aortic valve is tricuspid. Aortic valve regurgitation is not visualized. Pulmonic Valve: The pulmonic valve was grossly normal. Pulmonic valve regurgitation is trivial. Aorta: The aortic root is normal in size and structure. Venous: The inferior vena cava is normal in size with less than 50% respiratory variability, suggesting right atrial pressure of 8 mmHg. IAS/Shunts: No atrial level shunt detected by color flow Doppler.  LEFT VENTRICLE PLAX 2D LVIDd:         4.37 cm Diastology LVIDs:         2.20 cm LV e' medial:    7.18 cm/s LV PW:         1.72 cm LV E/e' medial:  9.2 LV IVS:        1.46 cm LV e' lateral:   12.90 cm/s  LV E/e' lateral: 5.1  RIGHT VENTRICLE RV S prime:     16.00 cm/s TAPSE (M-mode): 1.5 cm LEFT ATRIUM             Index       RIGHT ATRIUM           Index LA diam:        3.70 cm 1.62 cm/m  RA Area:     15.40 cm LA Vol (A2C):   63.9 ml 27.90 ml/m RA Volume:   39.80 ml  17.38 ml/m LA Vol (A4C):   40.6 ml 17.73 ml/m LA Biplane Vol: 52.4 ml 22.88 ml/m   AORTA Ao Root diam: 3.50 cm MITRAL VALVE MV Area (PHT): 6.60 cm MV Decel Time: 115 msec MV E velocity: 66.00 cm/s MV A velocity: 39.80 cm/s MV E/A ratio:  1.66 Rozann Lesches MD Electronically signed by Rozann Lesches MD Signature Date/Time: 09/30/2019/4:58:16 PM    Final    [place] EKG SITE RITE  Result Date: 10/13/2019 If Site Rite image not attached, placement could not be confirmed due to current cardiac rhythm.      Subjective: No nausea, vomiting.  Still complains of abdominal pressure after eating and drinking  Discharge Exam: Vitals:   10/24/19 2315 10/25/19 0050 10/25/19 0551 10/25/19 2044  BP: 110/68 100/74 101/65 99/65  Pulse: (!) 102 97 99 80  Resp: 14 14 14 18   Temp: 98.1 F (36.7 C) 98.2 F (36.8 C) 97.9 F (36.6 C) 98.3 F (36.8 C)  TempSrc: Oral Oral Oral Oral   SpO2: 97% 97% 97% 95%  Weight:      Height:        General: Pt is alert, awake, not in acute distress Cardiovascular: RRR, S1/S2 +, no rubs, no gallops Respiratory: Crackles at bases Abdominal: Soft, NT, ND, bowel sounds + Extremities: 2+ anasarca bilaterally    The results of significant diagnostics from this hospitalization (including imaging, microbiology, ancillary and laboratory) are listed below for reference.     Microbiology: No results found for this or any previous visit (from the past 240 hour(s)).   Labs: BNP (last 3 results) No results for input(s): BNP in the last 8760 hours. Basic Metabolic Panel: Recent Labs  Lab 10/21/19 0851 10/22/19 0710 10/24/19 0636  NA 133* 135 133*  K 3.4* 4.5 3.9  CL 94* 98 95*  CO2 31 30 28   GLUCOSE 167* 166* 180*  BUN 18 26* 30*  CREATININE 0.61 0.54* 0.64  CALCIUM 7.8* 7.6* 8.0*  PHOS  --  2.7  --    Liver Function Tests: Recent Labs  Lab 10/21/19 0851 10/22/19 0710 10/24/19 0636  AST 21  --  24  ALT 43  --  31  ALKPHOS 137*  --  118  BILITOT 0.3  --  0.4  PROT 4.7*  --  4.2*  ALBUMIN 1.9* 1.9* 2.0*   No results for input(s): LIPASE, AMYLASE in the last 168 hours. No results for input(s): AMMONIA in the last 168 hours. CBC: Recent Labs  Lab 10/21/19 0851 10/22/19 0710 10/24/19 0636 10/25/19 0456  WBC 6.2 7.2 9.6  --   HGB 8.2* 8.1* 5.8* 7.8*  HCT 27.0* 27.1* 20.2* 25.7*  MCV 82.1 81.6 84.2  --   PLT 175 274 420*  --    Cardiac Enzymes: No results for input(s): CKTOTAL, CKMB, CKMBINDEX, TROPONINI in the last 168 hours. BNP: Invalid input(s): POCBNP CBG: Recent Labs  Lab 10/24/19 1116 10/24/19 1657 10/24/19 2219 10/25/19 0725  10/25/19 1150  GLUCAP 190* 172* 150* 139* 232*   D-Dimer No results for input(s): DDIMER in the last 72 hours. Hgb A1c No results for input(s): HGBA1C in the last 72 hours. Lipid Profile No results for input(s): CHOL, HDL, LDLCALC, TRIG, CHOLHDL, LDLDIRECT in the last  72 hours. Thyroid function studies No results for input(s): TSH, T4TOTAL, T3FREE, THYROIDAB in the last 72 hours.  Invalid input(s): FREET3 Anemia work up No results for input(s): VITAMINB12, FOLATE, FERRITIN, TIBC, IRON, RETICCTPCT in the last 72 hours. Urinalysis    Component Value Date/Time   COLORURINE YELLOW 10/12/2019 1544   APPEARANCEUR HAZY (A) 10/12/2019 1544   LABSPEC 1.014 10/12/2019 1544   PHURINE 5.0 10/12/2019 1544   GLUCOSEU NEGATIVE 10/12/2019 1544   HGBUR MODERATE (A) 10/12/2019 1544   BILIRUBINUR NEGATIVE 10/12/2019 1544   KETONESUR NEGATIVE 10/12/2019 1544   PROTEINUR NEGATIVE 10/12/2019 1544   NITRITE NEGATIVE 10/12/2019 1544   LEUKOCYTESUR NEGATIVE 10/12/2019 1544   Sepsis Labs Invalid input(s): PROCALCITONIN,  WBC,  LACTICIDVEN Microbiology No results found for this or any previous visit (from the past 240 hour(s)).   Time coordinating discharge: 75mins  SIGNED:   Kathie Dike, MD  [facility] 10/25/2019, 9:05 PM   If 7PM-7AM, please contact night-coverage [email or web address]

## 2019-10-25 NOTE — Plan of Care (Signed)

## 2019-10-26 DIAGNOSIS — C787 Secondary malignant neoplasm of liver and intrahepatic bile duct: Secondary | ICD-10-CM

## 2019-10-26 LAB — RESPIRATORY PANEL BY RT PCR (FLU A&B, COVID)
Influenza A by PCR: NEGATIVE
Influenza B by PCR: NEGATIVE
SARS Coronavirus 2 by RT PCR: NEGATIVE

## 2019-10-26 MED ORDER — BUSPIRONE HCL 5 MG PO TABS: 5.0000 mg | ORAL_TABLET | Freq: Three times a day (TID) | ORAL | 0 refills | Status: AC

## 2019-10-26 MED ORDER — MIRTAZAPINE 15 MG PO TABS: 15.0000 mg | ORAL_TABLET | Freq: Every day | ORAL | 0 refills | Status: AC

## 2019-10-26 NOTE — TOC Transition Note (Signed)
Transition of Care [facility]) - CM/SW Discharge Note   Patient Details  Name: Stephen Wilcox MRN: [id number] Date of Birth: 26-Feb-1955  [facility] CM/SW Contact:  Shade Flood, LCSW Phone Number: 10/26/2019, 12:54 PM   Clinical Narrative:     There is availability for pt at the hospice residence today. Updated pt's daughter, Stephen Wilcox, and she states family remains in agreement with transfer to hospice. DC clinical sent electronically. RN to call report. EMS arranged.  There are no other TOC needs for dc.  Final next level of care: [place] Barriers to Discharge: Barriers Resolved   Patient Goals and CMS Choice        Discharge Placement                       Discharge Plan and Services                                     Social Determinants of Health (SDOH) Interventions     Readmission Risk Interventions No flowsheet data found.

## 2019-10-26 NOTE — Discharge Summary (Signed)
Physician Discharge Summary  Stephen Wilcox WYO:[id number] DOB: Nov 04, 1955 DOA: 10/25/2019  PCP: Jake Samples, PA-C  Admit date: 10/25/2019  Discharge date: 10/26/2019  Admitted From:Home  Disposition:  Residential hospice  Recommendations for Outpatient Follow-up:  1. Patient has been discharged to residential hospice for end-of-life care  Home Health: None  Equipment/Devices: None  Discharge Condition: Stable  CODE STATUS: DNR  Diet recommendation: Soft diet as tolerated  Brief/Interim Summary: 64 year old male presented to the emergency room with lightheadedness, malaise and generalized weakness.  He also had cough, shortness of breath.  He was noted to be hypoxic on room air and tested positive for COVID-19.  He was treated with remdesivir and steroids.  He was noted to be anemic on admission with a hemoglobin of 7.3.  He was transfused PRBCs and was also noted to be Hemoccult positive.  GI was consulted and patient underwent endoscopic evaluation.  He was noted to have friable gastric mass in his stomach extending up into his esophagus.  Further imaging also confirmed hepatic metastases.  Biopsies taken during endoscopy confirmed adenocarcinoma.  Plans were for outpatient follow-up with oncology  Unfortunately, hospital course complicated by development of severe sepsis with septic shock.  He was found to have E. coli bacteremia/sepsis.  Patient was monitored in the ICU where he required vasopressor support.  Urine culture also positive for E. coli.  He was treated with intravenous antibiotics and has since stabilized.  His overall prognosis has been poor.  With his friable gastric mass, he is prone to continue to have chronic GI blood loss.  He has poor p.o. intake with low albumin.  He is nonambulatory at this time.  After several discussions with the patient with palliative care, it was decided to pursue comfort measures/hospice.  Patient has been accepted to  residential hospice for end-of-life care.  He was transitioned to GIP status and now has residential hospice bed available and will be transferred to that facility.  Discharge Diagnoses:  Active Problems:   Cancer, metastatic to liver [facility])  Principal Problem:   Severe sepsis with septic shock-Severe Sepsis with Septic Shock Secondary to E. coli infection with Acute Hypotension/Elevated Lactic Acid/Leukocytosis Active Problems:   Pneumonia due to COVID-19 virus   Acute respiratory failure with hypoxia (HCC)   Sepsis (HCC)   Hyperglycemia   Thrombocytosis   Hyponatremia   Hypokalemia   Microcytic anemia   GI bleed   Symptomatic anemia   Lactic acidosis   Acute respiratory disease due to COVID-19 virus   Atrial fibrillation with RVR (HCC)   Liver masses   Cancer, metastatic to liver [facility])   Goals of care, counseling/discussion   Palliative care by specialist  Discharge Instructions  Discharge Instructions    Diet - low sodium heart healthy   Complete by: As directed    Increase activity slowly   Complete by: As directed      Allergies as of 10/26/2019   No Known Allergies     Medication List    TAKE these medications   busPIRone 5 MG tablet Commonly known as: BUSPAR Take 1 tablet (5 mg total) by mouth 3 (three) times daily.   digoxin 0.125 MG tablet Commonly known as: LANOXIN Take 1 tablet (0.125 mg total) by mouth daily.   diltiazem 240 MG 24 hr capsule Commonly known as: CARDIZEM CD Take 1 capsule (240 mg total) by mouth daily.   metoprolol tartrate 25 MG tablet Commonly known as: LOPRESSOR Take 0.5 tablets (12.5  mg total) by mouth 2 (two) times daily.   mirtazapine 15 MG tablet Commonly known as: REMERON Take 1 tablet (15 mg total) by mouth at bedtime.   pantoprazole 40 MG tablet Commonly known as: PROTONIX Take 1 tablet (40 mg total) by mouth 2 (two) times daily before a meal.   tamsulosin 0.4 MG Caps capsule Commonly known as: FLOMAX Take 1  capsule (0.4 mg total) by mouth daily after supper.   vitamin C 250 MG tablet Commonly known as: ASCORBIC ACID Take 500 mg by mouth daily.       No Known Allergies  Consultations:  Palliative care  Gastroenterology   Procedures/Studies: CT ANGIO CHEST PE W OR WO CONTRAST  Result Date: 10/03/2019 CLINICAL DATA:  COVID-19 positive, weakness, dizziness and shortness of breath EXAM: CT ANGIOGRAPHY CHEST WITH CONTRAST TECHNIQUE: Multidetector CT imaging of the chest was performed using the standard protocol during bolus administration of intravenous contrast. Multiplanar CT image reconstructions and MIPs were obtained to evaluate the vascular anatomy. CONTRAST:  136mL OMNIPAQUE IOHEXOL 350 MG/ML SOLN COMPARISON:  Radiograph 09/28/2019, CT abdomen pelvis 08/15/2018 FINDINGS: Cardiovascular: Satisfactory opacification the pulmonary arteries to the segmental level. No pulmonary artery filling defects are identified. Central pulmonary arteries are normal caliber. Suboptimal opacification of the thoracic aorta. No discernible acute abnormality of the thoracic aorta proximal great vessels. Aberrant right subclavian artery. Borderline cardiac enlargement. Slight reflux of contrast into the IVC. Mediastinum/Nodes: No mediastinal fluid or gas. Normal thyroid gland and thoracic inlet. No acute abnormality of the trachea or esophagus. No worrisome mediastinal, hilar or axillary adenopathy. Lungs/Pleura: Mixed areas of heterogeneous consolidative and ground-glass opacity throughout both lungs with a basilar and peripheral predominance. No pneumothorax or visible effusion. Diffuse airways thickening. Upper Abdomen: Indeterminate intermediate attenuation (38 HU) lesion measuring up to 8.5 cm in maximum transaxial dimension within the right lobe liver (4/80). Cholelithiasis with a partially calcified gallstone towards the gallbladder neck. Multiple soft tissue attenuation lesions present along the lesser  curvature/gastrohepatic ligament. Largest measuring up to 4.6 cm in size (4/91). Partially exophytic 0.7 cm lesion arising from the upper pole left kidney, corresponding with a previously seen fluid attenuation cyst on prior comparison CT, could reflect intracystic hemorrhage/proteinaceous debris. Musculoskeletal: Multilevel degenerative changes are present in the imaged portions of the spine. No acute osseous abnormality or suspicious osseous lesion. Probable intramuscular lipoma of the right infraspinatus measuring up to 6.9 by 3.5 cm (4/20). No other acute or worrisome chest wall lesions. Review of the MIP images confirms the above findings. IMPRESSION: 1. No evidence of acute pulmonary artery filling defects. 2. Mixed areas of heterogeneous consolidative and ground-glass opacity throughout both lungs with a basilar and peripheral predominance. Findings are compatible with multifocal pneumonia compatible with a COVID-19 etiology. 3. Indeterminate 4.8 cm lesion in the right lobe liver, incompletely characterized. Additional multiple soft tissue attenuation lesions along the lesser curvature/gastrohepatic ligament, largest measuring up to 4.6 cm in size. These findings are new from comparison abdominal CT 08/15/2018 and could raise concern for potential malignancy. Could consider further evaluation with abdominal MR with contrast. 4. Additional 7 mm hyperdense lesion arising from the upper pole left kidney, could reflect proteinaceous cysts, Ob also be better evaluated on dedicated abdominal imaging. 5. Cholelithiasis. 6. Aberrant right subclavian artery. These results will be called to the ordering clinician or representative by the Radiologist Assistant, and communication documented in the PACS or [employer]. Electronically Signed   By: Lovena Le M.D.   On: 10/03/2019 15:38  CT ABDOMEN PELVIS W CONTRAST  Result Date: 10/11/2019 CLINICAL DATA:  COVID positive. EXAM: CT ABDOMEN AND PELVIS WITH  CONTRAST TECHNIQUE: Multidetector CT imaging of the abdomen and pelvis was performed using the standard protocol following bolus administration of intravenous contrast. CONTRAST:  8mL OMNIPAQUE IOHEXOL 300 MG/ML  SOLN COMPARISON:  August 15, 2018 FINDINGS: Lower chest: Marked severity bilateral lower lobe infiltrates are seen. Hepatobiliary: Multiple large heterogeneous low-attenuation liver masses are seen. The largest measures approximately 6.0 cm x 5.3 cm x 6.1 cm. A 1.7 cm gallstone is seen within the neck of an otherwise normal-appearing gallbladder. Pancreas: Unremarkable. No pancreatic ductal dilatation or surrounding inflammatory changes. Spleen: Normal in size without focal abnormality. Adrenals/Urinary Tract: Adrenal glands are unremarkable. Kidneys are normal in size, without renal calculi or hydronephrosis. A stable 1.2 cm diameter area of heterogeneous low attenuation is seen within the medial aspect of the mid to lower right kidney. A stable 1.2 cm cyst is seen along the posterior aspect of the upper pole of the left kidney. Bladder is unremarkable. Stomach/Bowel: A 3.0 cm x 8.7 cm x 6.3 cm heterogeneous soft tissue mass is seen extending from the wall of the lesser sac of the stomach into the gastric lumen (axial CT images 15 through 25, CT series number 2). Appendix appears normal. There is no evidence of bowel dilatation. Numerous diverticula are seen throughout the descending and sigmoid colon. Moderate severity thickening of the proximal sigmoid colon is noted (best seen on coronal reformatted images 43 through 50, CT series number 5). Vascular/Lymphatic: No significant vascular findings are present. Reproductive: The prostate gland is markedly enlarged. Other: Multiple round heterogeneous soft tissue masses of various sizes are seen within the upper abdomen, adjacent to the lesser sac of the stomach. The largest measures approximately 5.2 cm x 5.6 cm x 4.6 cm. Musculoskeletal: Multilevel  degenerative changes are seen throughout the lumbar spine. IMPRESSION: 1. Marked severity bilateral lower lobe infiltrates. 2. Multiple large heterogeneous low-attenuation liver masses, consistent with metastatic disease. 3. Large gastric mass, suspicious for primary neoplasm. 4. Multiple soft tissue masses within the mesentery of the upper abdomen, adjacent to the lesser sac of the stomach, consistent with metastatic disease. 5. Colonic diverticulosis. 6. Thickening of the proximal sigmoid colon which may represent sequelae associated with mild colitis/diverticulitis correlation with follow-up abdomen pelvis CT is recommended to exclude the presence of an underlying neoplasm. 7. Stable area of low attenuation within the right kidney. While this may represent a hemorrhagic cyst, correlation with renal ultrasound is recommended. 8. Cholelithiasis. Electronically Signed   By: Virgina Norfolk M.D.   On: 10/11/2019 23:24   MR LIVER W WO CONTRAST  Result Date: 10/04/2019 CLINICAL DATA:  Evaluate liver lesions and upper abdominal adenopathy seen on recent chest CT. EXAM: MRI ABDOMEN WITHOUT AND WITH CONTRAST TECHNIQUE: Multiplanar multisequence MR imaging of the abdomen was performed both before and after the administration of intravenous contrast. CONTRAST:  63mL GADAVIST GADOBUTROL 1 MMOL/ML IV SOLN COMPARISON:  CT abdomen/pelvis 08/15/2018 FINDINGS: Lower chest: The lungs demonstrate changes of COVID pneumonia as demonstrated on today's chest CT. Hepatobiliary: Numerous diffusion positive hepatic metastatic lesions are noted. Segment 4A lesion measures 3.7 cm on image 7/8. 5 cm lesion and segment 7 on image 12/8. 5 cm lesion in segment 6 on image 18/8. Several other smaller lesions are noted. No intrahepatic biliary dilatation. There is diffuse fatty infiltration of the liver noted. A 2 cm gallstone is noted in the gallbladder. No common bile duct  dilatation. Pancreas:  No mass, inflammation or ductal dilatation.  Spleen:  Normal size. No focal lesions. Adrenals/Urinary Tract: The adrenal glands and kidneys are unremarkable. Stomach/Bowel: There is a large infiltrating gastric mass involving the fundal region 6.5 x 4.8 cm and is diffusion positive. Moderate contrast enhancement is noted. Adjacent gastrohepatic ligament lymphadenopathy with the largest node measuring 4.6 cm. Vascular/Lymphatic: The aorta is normal in caliber. No dissection. The branch vessels are patent. No retroperitoneal lymphadenopathy. Other:  No ascites or abdominal wall hernia. Musculoskeletal: No worrisome bone lesions. IMPRESSION: 1. 6.5 x 4.8 cm infiltrating gastric mass involving the fundal region. Associated gastrohepatic ligament lymphadenopathy. 2. Numerous hepatic metastatic lesions. 3. Cholelithiasis. Electronically Signed   By: Marijo Sanes M.D.   On: 10/04/2019 06:26   US RENAL  Result Date: 10/13/2019 CLINICAL DATA:  Acute kidney injury, recent discovery of abdominal and liver masses. EXAM: RENAL / URINARY TRACT ULTRASOUND COMPLETE COMPARISON:  MRI 10/04/2019 and CT 10/11/2019 FINDINGS: Right Kidney: Renal measurements: 12.9 x 5.6 x 5.7 cm = volume: 213 mL. Echogenicity within normal limits. No mass or hydronephrosis visualized. Left Kidney: Renal measurements: 14 x 6.3 x 5.6 cm = volume: 258 mL. Echogenicity within normal limits. No mass or hydronephrosis visualized. Bladder: Foley catheter in the urinary bladder. Other: Incidental liver masses in the RIGHT hepatic lobe measuring 4.3 x 3.4 x 3.6 cm and 6.3 x 4.4 x 5.6 cm, grossly similar accounting for differences in angle of measurement and technique as compared to recent imaging studies. One of these measurements may combine the dimensions of 2 adjacent masses, would refer to previous CT and MRI for follow-up measurements. IMPRESSION: 1. No hydronephrosis. 2. Hepatic metastatic disease better visualized on recent CTs. Electronically Signed   By: Zetta Bills M.D.   On: 10/13/2019  14:20   DG CHEST PORT 1 VIEW  Result Date: 10/13/2019 CLINICAL DATA:  History of COVID-19 positivity, status post left PICC line placement EXAM: PORTABLE CHEST 1 VIEW COMPARISON:  09/28/2019 FINDINGS: Cardiac shadow is stable. Patchy airspace opacities are noted in both lungs increased from the prior exam particularly in the left retrocardiac region consistent with progressive COVID-19 pneumonia. Left-sided PICC line is seen with the catheter tip at the cavoatrial junction. No bony abnormality is noted. IMPRESSION: PICC line in satisfactory position. Increase in the degree of bilateral patchy airspace opacity consistent with COVID-19 positivity. Electronically Signed   By: Inez Catalina M.D.   On: 10/13/2019 20:06   DG Chest Port 1 View  Result Date: 09/28/2019 CLINICAL DATA:  COVID EXAM: PORTABLE CHEST 1 VIEW COMPARISON:  09/24/2019 FINDINGS: Development of patchy bilateral airspace opacities. No pleural effusion. Stable cardiomediastinal silhouette. No pneumothorax. IMPRESSION: Development of patchy bilateral airspace opacities, consistent with bilateral pneumonia. Electronically Signed   By: Donavan Foil M.D.   On: 09/28/2019 23:14   ECHOCARDIOGRAM COMPLETE  Result Date: 09/30/2019    ECHOCARDIOGRAM REPORT   Patient Name:   SHANKAR SILBER Date of Exam: 09/30/2019 Medical Rec #:  [id number]         Height:       72.0 in Accession #:    [id number]        Weight:       237.0 lb Date of Birth:  October 01, 1955        BSA:          2.290 m Patient Age:    4 years          BP:  95/60 mmHg Patient Gender: M                 HR:           126 bpm. Exam Location:  [facility] Procedure: 2D Echo Indications:    Endocarditis I38  History:        Patient has no prior history of Echocardiogram examinations.                 Risk Factors:Non-Smoker. Pneumonia due to COVID-19 virus,                 Sepsis, Lactic Acidosis.  Sonographer:    Leavy Cella RDCS (AE) Referring Phys: [id number] OLADAPO ADEFESO  IMPRESSIONS  1. Left ventricular ejection fraction, by estimation, is 55 to 60%. The left ventricle has normal function. The left ventricle has no regional wall motion abnormalities. There is moderate left ventricular hypertrophy. Left ventricular diastolic parameters are indeterminate.  2. Right ventricular systolic function is normal. The right ventricular size is normal. Tricuspid regurgitation signal is inadequate for assessing PA pressure.  3. The mitral valve is grossly normal. No evidence of mitral valve regurgitation.  4. The aortic valve is tricuspid. Aortic valve regurgitation is not visualized.  5. The inferior vena cava is normal in size with <50% respiratory variability, suggesting right atrial pressure of 8 mmHg.  6. Views are somewhat limited, but no definite valvular vegetations are visualized. FINDINGS  Left Ventricle: Left ventricular ejection fraction, by estimation, is 55 to 60%. The left ventricle has normal function. The left ventricle has no regional wall motion abnormalities. The left ventricular internal cavity size was normal in size. There is  moderate left ventricular hypertrophy. Left ventricular diastolic parameters are indeterminate. Right Ventricle: The right ventricular size is normal. No increase in right ventricular wall thickness. Right ventricular systolic function is normal. Tricuspid regurgitation signal is inadequate for assessing PA pressure. Left Atrium: Left atrial size was normal in size. Right Atrium: Right atrial size was normal in size. Pericardium: There is no evidence of pericardial effusion. Mitral Valve: The mitral valve is grossly normal. No evidence of mitral valve regurgitation. Tricuspid Valve: The tricuspid valve is grossly normal. Tricuspid valve regurgitation is trivial. Aortic Valve: The aortic valve is tricuspid. Aortic valve regurgitation is not visualized. Pulmonic Valve: The pulmonic valve was grossly normal. Pulmonic valve regurgitation is trivial.  Aorta: The aortic root is normal in size and structure. Venous: The inferior vena cava is normal in size with less than 50% respiratory variability, suggesting right atrial pressure of 8 mmHg. IAS/Shunts: No atrial level shunt detected by color flow Doppler.  LEFT VENTRICLE PLAX 2D LVIDd:         4.37 cm Diastology LVIDs:         2.20 cm LV e' medial:    7.18 cm/s LV PW:         1.72 cm LV E/e' medial:  9.2 LV IVS:        1.46 cm LV e' lateral:   12.90 cm/s                        LV E/e' lateral: 5.1  RIGHT VENTRICLE RV S prime:     16.00 cm/s TAPSE (M-mode): 1.5 cm LEFT ATRIUM             Index       RIGHT ATRIUM           Index LA diam:  3.70 cm 1.62 cm/m  RA Area:     15.40 cm LA Vol (A2C):   63.9 ml 27.90 ml/m RA Volume:   39.80 ml  17.38 ml/m LA Vol (A4C):   40.6 ml 17.73 ml/m LA Biplane Vol: 52.4 ml 22.88 ml/m   AORTA Ao Root diam: 3.50 cm MITRAL VALVE MV Area (PHT): 6.60 cm MV Decel Time: 115 msec MV E velocity: 66.00 cm/s MV A velocity: 39.80 cm/s MV E/A ratio:  1.66 Rozann Lesches MD Electronically signed by Rozann Lesches MD Signature Date/Time: 09/30/2019/4:58:16 PM    Final    [place] EKG SITE RITE  Result Date: 10/13/2019 If Site Rite image not attached, placement could not be confirmed due to current cardiac rhythm.     Discharge Exam: Vitals:   10/25/19 2100 10/26/19 1000  BP:  99/74  Pulse:  93  SpO2: 92% 96%   Vitals:   10/25/19 2100 10/26/19 1000  BP:  99/74  Pulse:  93  SpO2: 92% 96%    General: Pt is alert, awake, not in acute distress Cardiovascular: RRR, S1/S2 +, no rubs, no gallops Respiratory: Crackles at bases, currently on 5 L nasal cannula oxygen Abdominal: Soft, NT, ND, bowel sounds + Extremities: 2+ anasarca bilaterally    The results of significant diagnostics from this hospitalization (including imaging, microbiology, ancillary and laboratory) are listed below for reference.     Microbiology: No results found for this or any previous visit  (from the past 240 hour(s)).   Labs: BNP (last 3 results) No results for input(s): BNP in the last 8760 hours. Basic Metabolic Panel: Recent Labs  Lab 10/21/19 0851 10/22/19 0710 10/24/19 0636  NA 133* 135 133*  K 3.4* 4.5 3.9  CL 94* 98 95*  CO2 31 30 28   GLUCOSE 167* 166* 180*  BUN 18 26* 30*  CREATININE 0.61 0.54* 0.64  CALCIUM 7.8* 7.6* 8.0*  PHOS  --  2.7  --    Liver Function Tests: Recent Labs  Lab 10/21/19 0851 10/22/19 0710 10/24/19 0636  AST 21  --  24  ALT 43  --  31  ALKPHOS 137*  --  118  BILITOT 0.3  --  0.4  PROT 4.7*  --  4.2*  ALBUMIN 1.9* 1.9* 2.0*   No results for input(s): LIPASE, AMYLASE in the last 168 hours. No results for input(s): AMMONIA in the last 168 hours. CBC: Recent Labs  Lab 10/21/19 0851 10/22/19 0710 10/24/19 0636 10/25/19 0456  WBC 6.2 7.2 9.6  --   HGB 8.2* 8.1* 5.8* 7.8*  HCT 27.0* 27.1* 20.2* 25.7*  MCV 82.1 81.6 84.2  --   PLT 175 274 420*  --    Cardiac Enzymes: No results for input(s): CKTOTAL, CKMB, CKMBINDEX, TROPONINI in the last 168 hours. BNP: Invalid input(s): POCBNP CBG: Recent Labs  Lab 10/24/19 1116 10/24/19 1657 10/24/19 2219 10/25/19 0725 10/25/19 1150  GLUCAP 190* 172* 150* 139* 232*   D-Dimer No results for input(s): DDIMER in the last 72 hours. Hgb A1c No results for input(s): HGBA1C in the last 72 hours. Lipid Profile No results for input(s): CHOL, HDL, LDLCALC, TRIG, CHOLHDL, LDLDIRECT in the last 72 hours. Thyroid function studies No results for input(s): TSH, T4TOTAL, T3FREE, THYROIDAB in the last 72 hours.  Invalid input(s): FREET3 Anemia work up No results for input(s): VITAMINB12, FOLATE, FERRITIN, TIBC, IRON, RETICCTPCT in the last 72 hours. Urinalysis    Component Value Date/Time   COLORURINE YELLOW 10/12/2019 1544  APPEARANCEUR HAZY (A) 10/12/2019 1544   LABSPEC 1.014 10/12/2019 1544   PHURINE 5.0 10/12/2019 1544   GLUCOSEU NEGATIVE 10/12/2019 1544   HGBUR MODERATE (A)  10/12/2019 1544   BILIRUBINUR NEGATIVE 10/12/2019 1544   KETONESUR NEGATIVE 10/12/2019 1544   PROTEINUR NEGATIVE 10/12/2019 1544   NITRITE NEGATIVE 10/12/2019 1544   LEUKOCYTESUR NEGATIVE 10/12/2019 1544   Sepsis Labs Invalid input(s): PROCALCITONIN,  WBC,  LACTICIDVEN Microbiology No results found for this or any previous visit (from the past 240 hour(s)).   Time coordinating discharge: 35 minutes  SIGNED:   Rodena Goldmann, DO [facility] 10/26/2019, 11:46 AM  If 7PM-7AM, please contact night-coverage [email or web address]

## 2019-10-27 ENCOUNTER — Telehealth (INDEPENDENT_AMBULATORY_CARE_PROVIDER_SITE_OTHER): Payer: Self-pay

## 2019-10-27 ENCOUNTER — Ambulatory Visit (INDEPENDENT_AMBULATORY_CARE_PROVIDER_SITE_OTHER): Payer: 59 | Admitting: Gastroenterology

## 2019-10-27 NOTE — Telephone Encounter (Signed)
Patient no showed for the appointment on 10/27/2019.

## 2019-10-27 NOTE — Telephone Encounter (Signed)
noted 

## 2019-11-17 NOTE — Progress Notes (Deleted)
Subjective:  1. Elevated PSA     I have urinary retention.  HPI: Stephen Wilcox is a 64 year-old male established patient who is here for urinary retention.    Kaelon returns today for a PVR. He had prostatitis with retention and the foley was removed on 7/31. He is voiding well with no hematuria or dysuria. His IPSS is 1. He completed Tamsulosin and Cipro yesterday.          ROS:  ROS:  A complete review of systems was performed.  All systems are negative except for pertinent findings as noted.   ROS  No Known Allergies  Outpatient Encounter Medications as of 11/18/2019  Medication Sig  . busPIRone (BUSPAR) 5 MG tablet Take 1 tablet (5 mg total) by mouth 3 (three) times daily.  . digoxin (LANOXIN) 0.125 MG tablet Take 1 tablet (0.125 mg total) by mouth daily.  Marland Kitchen diltiazem (CARDIZEM CD) 240 MG 24 hr capsule Take 1 capsule (240 mg total) by mouth daily.  . metoprolol tartrate (LOPRESSOR) 25 MG tablet Take 0.5 tablets (12.5 mg total) by mouth 2 (two) times daily.  . mirtazapine (REMERON) 15 MG tablet Take 1 tablet (15 mg total) by mouth at bedtime.  . pantoprazole (PROTONIX) 40 MG tablet Take 1 tablet (40 mg total) by mouth 2 (two) times daily before a meal.  . tamsulosin (FLOMAX) 0.4 MG CAPS capsule Take 1 capsule (0.4 mg total) by mouth daily after supper.  . vitamin C (ASCORBIC ACID) 250 MG tablet Take 500 mg by mouth daily.   No facility-administered encounter medications on file as of 11/18/2019.    No past medical history on file.  Past Surgical History:  Procedure Laterality Date  . ANKLE SURGERY     2010  . BIOPSY  10/10/2019   Procedure: BIOPSY;  Surgeon: Rogene Houston, MD;  Location: [facility];  Service: Endoscopy;;  . ESOPHAGOGASTRODUODENOSCOPY (EGD) WITH PROPOFOL N/A 10/10/2019   Procedure: ESOPHAGOGASTRODUODENOSCOPY (EGD) WITH PROPOFOL;  Surgeon: Rogene Houston, MD;  Location: [facility];  Service: Endoscopy;  Laterality: N/A;    Social  History   Socioeconomic History  . Marital status: Married    Spouse name: Not on file  . Number of children: Not on file  . Years of education: Not on file  . Highest education level: Not on file  Occupational History  . Not on file  Tobacco Use  . Smoking status: Never Smoker  . Smokeless tobacco: Never Used  Substance and Sexual Activity  . Alcohol use: Never  . Drug use: Never  . Sexual activity: Not on file  Other Topics Concern  . Not on file  Social History Narrative  . Not on file   Social Determinants of Health   Financial Resource Strain:   . Difficulty of Paying Living Expenses: Not on file  Food Insecurity:   . Worried About Charity fundraiser in the Last Year: Not on file  . Ran Out of Food in the Last Year: Not on file  Transportation Needs:   . Lack of Transportation (Medical): Not on file  . Lack of Transportation (Non-Medical): Not on file  Physical Activity:   . Days of Exercise per Week: Not on file  . Minutes of Exercise per Session: Not on file  Stress:   . Feeling of Stress : Not on file  Social Connections:   . Frequency of Communication with Friends and Family: Not on file  . Frequency of Social  Gatherings with Friends and Family: Not on file  . Attends Religious Services: Not on file  . Active Member of Clubs or Organizations: Not on file  . Attends Archivist Meetings: Not on file  . Marital Status: Not on file  Intimate Partner Violence:   . Fear of Current or Ex-Partner: Not on file  . Emotionally Abused: Not on file  . Physically Abused: Not on file  . Sexually Abused: Not on file    No family history on file.     Objective: There were no vitals filed for this visit.   Physical Exam  Lab Results:  No results found for this or any previous visit (from the past 24 hour(s)).  BMET No results for input(s): NA, K, CL, CO2, GLUCOSE, BUN, CREATININE, CALCIUM in the last 72 hours. PSA No results found for: PSA No  results found for: TESTOSTERONE    Studies/Results: No results found.    Assessment & Plan: No problem-specific Assessment & Plan notes found for this encounter.    No orders of the defined types were placed in this encounter.    No orders of the defined types were placed in this encounter.     No follow-ups on file.   CC: Scherrie Bateman      Irine Seal 11/17/2019

## 2019-11-18 ENCOUNTER — Ambulatory Visit: Payer: Self-pay | Admitting: Urology

## 2019-11-18 DIAGNOSIS — R972 Elevated prostate specific antigen [PSA]: Secondary | ICD-10-CM

## 2019-11-21 DEATH — deceased

## 2021-08-05 IMAGING — CT CT ABD-PELV W/ CM
2 of 4 series · 15 of 46 positions shown, 17 images · IV contrast (Omnipaque or Isovue)
Comparison: August 15, 2018

CLINICAL DATA: COVID positive.

EXAM:
CT ABDOMEN AND PELVIS WITH CONTRAST
TECHNIQUE: Multidetector CT imaging of the abdomen and pelvis was performed
using the standard protocol following bolus administration of
intravenous contrast.
CONTRAST:  75mL OMNIPAQUE IOHEXOL 300 MG/ML  SOLN

[Series 2: axial st · axial · 0.98mm/px · z∈[+1052,+1507]mm · 12 of 101 slices shown, 14 images]
[im 5/101  soft-tissue]
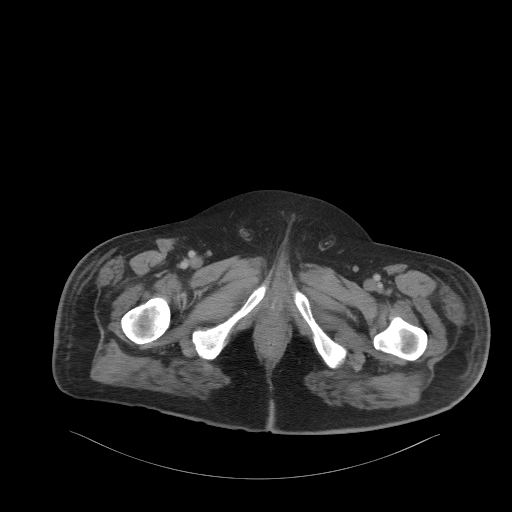
[im 5/101  bone]
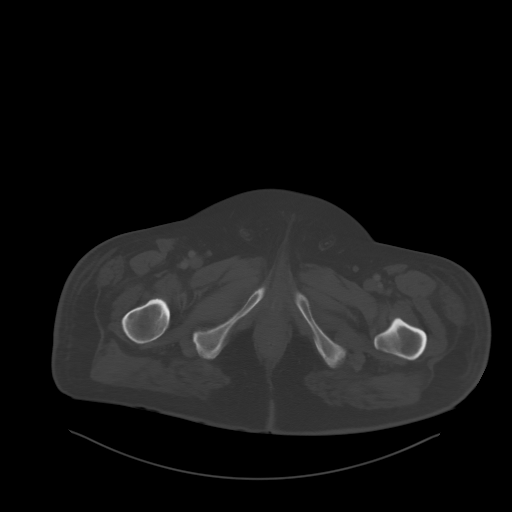
[im 14/101  soft-tissue]
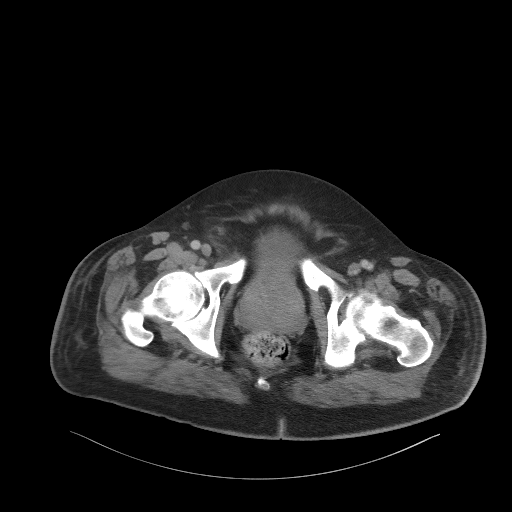
[im 23/101  soft-tissue]
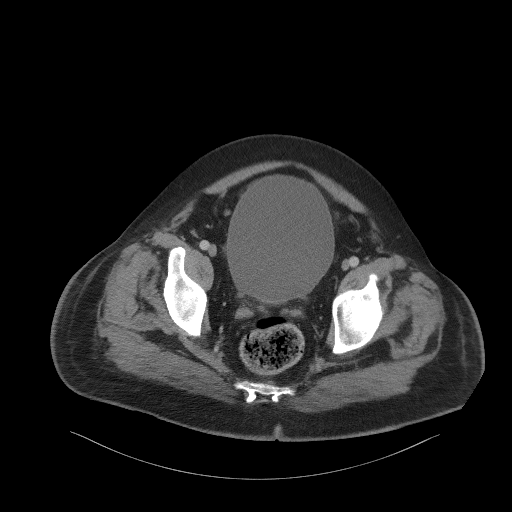
[im 32/101  soft-tissue]
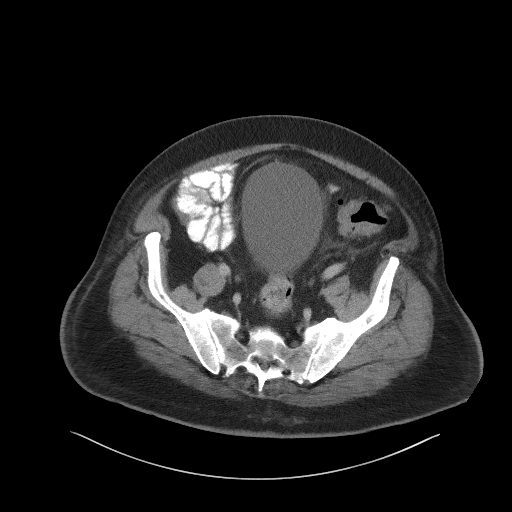
[im 37/101  soft-tissue]
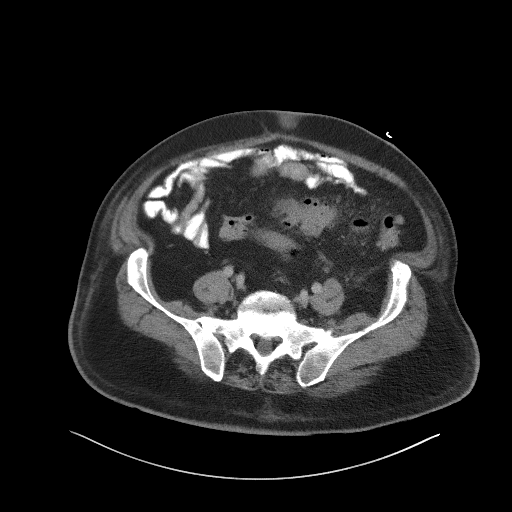
[im 46/101  soft-tissue]
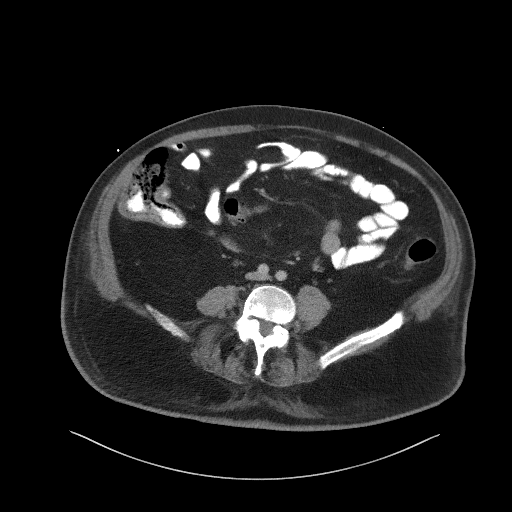
[im 55/101  soft-tissue]
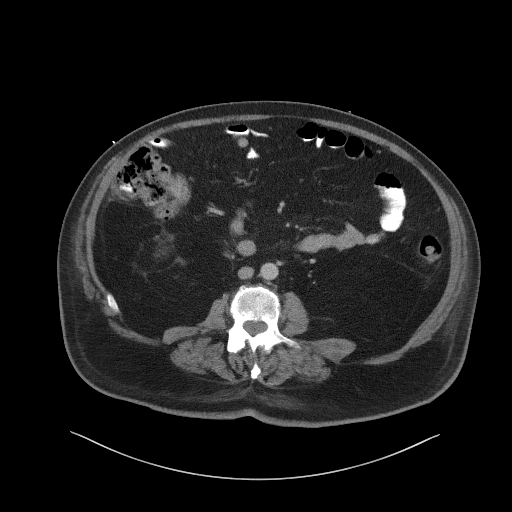
[im 64/101  soft-tissue]
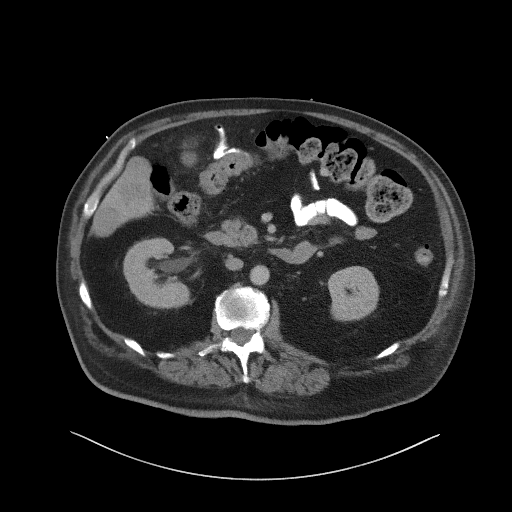
[im 69/101  soft-tissue]
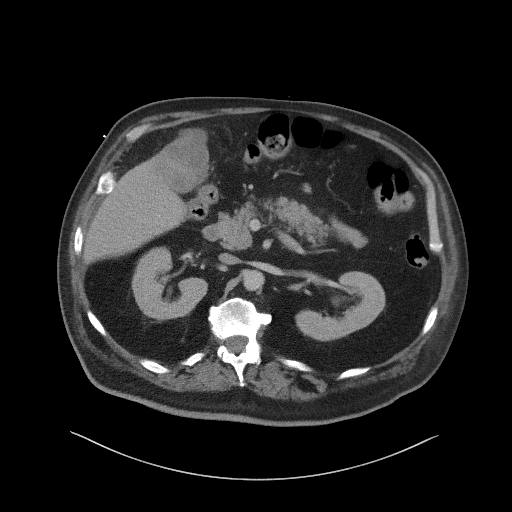
[im 69/101  bone]
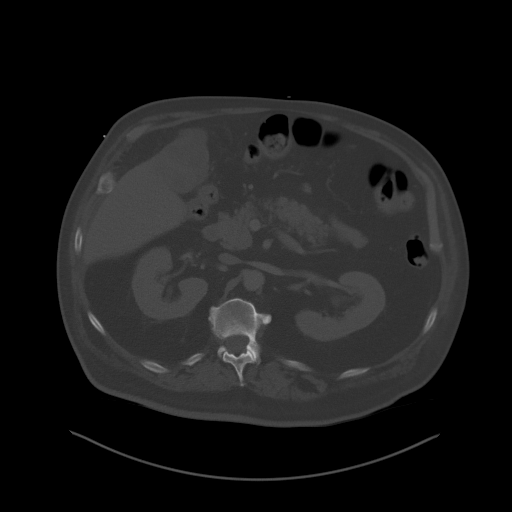
[im 78/101  soft-tissue]
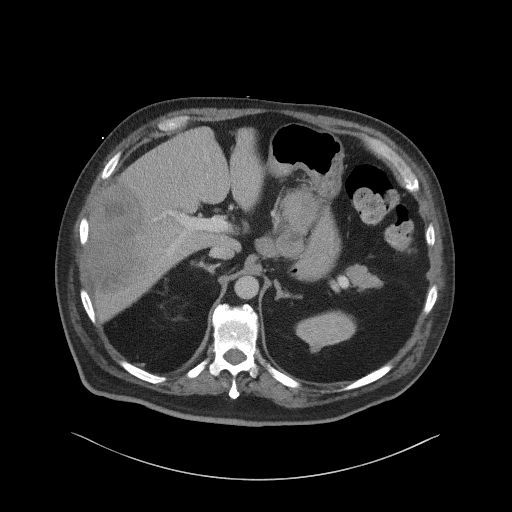
[im 87/101  soft-tissue]
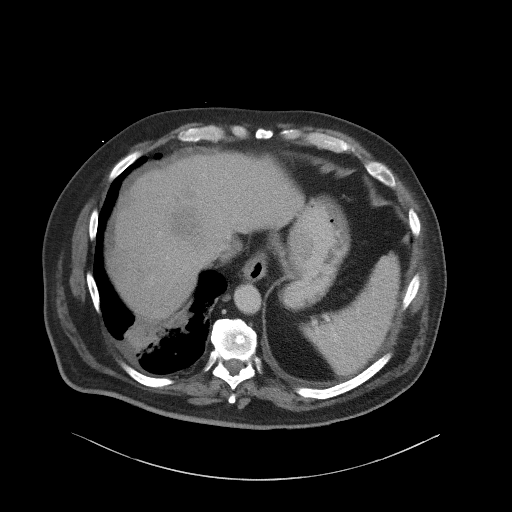
[im 96/101  soft-tissue]
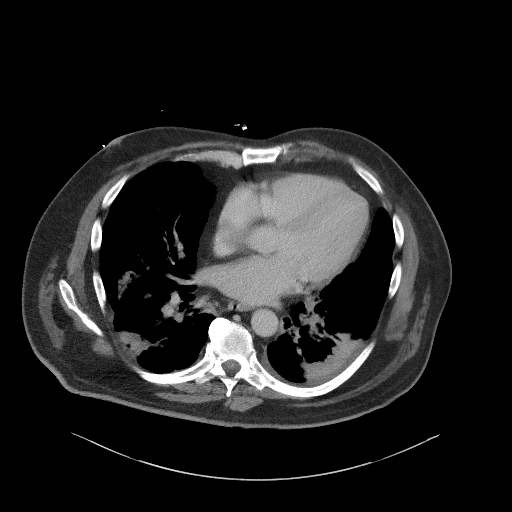

[Series 5: coronal st · coronal · 0.88mm/px · 3 of 117 slices shown]
[im 39/117  soft-tissue]
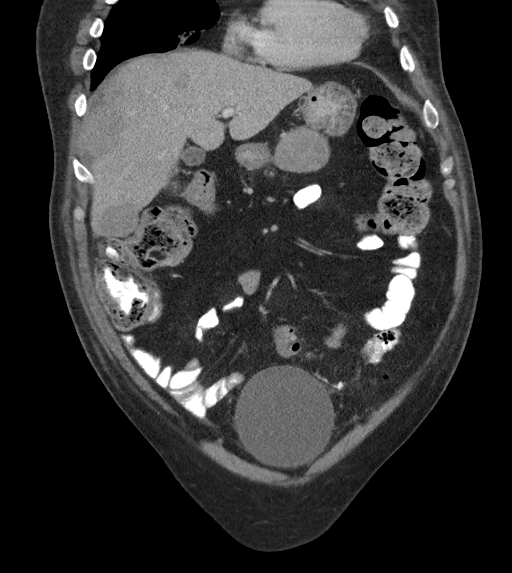
[im 52/117  soft-tissue]
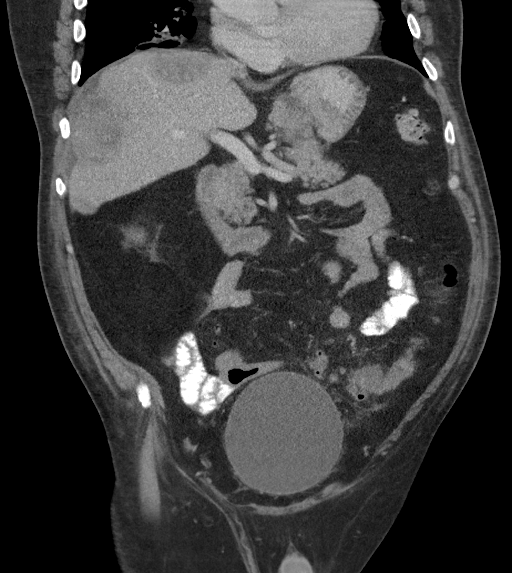
[im 65/117  soft-tissue]
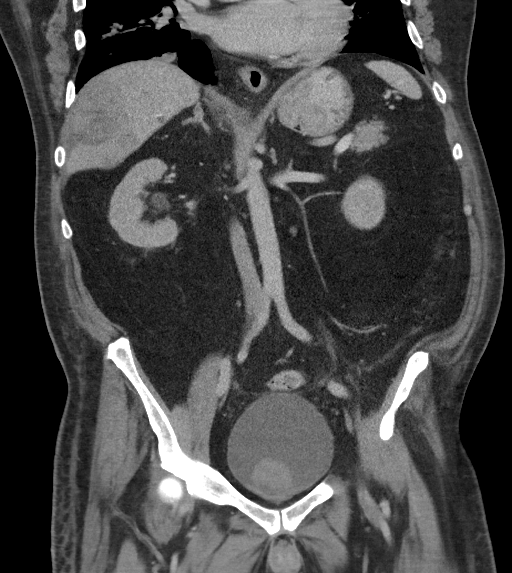

[15 of 46 positions shown; findings below may reference images not displayed]

FINDINGS: Lower chest: Marked severity bilateral lower lobe infiltrates are
seen.

Hepatobiliary: Multiple large heterogeneous low-attenuation liver
masses are seen. The largest measures approximately 6.0 cm x 5.3 cm
x 6.1 cm. A 1.7 cm gallstone is seen within the neck of an otherwise
normal-appearing gallbladder.

Pancreas: Unremarkable. No pancreatic ductal dilatation or
surrounding inflammatory changes.

Spleen: Normal in size without focal abnormality.

Adrenals/Urinary Tract: Adrenal glands are unremarkable. Kidneys are
normal in size, without renal calculi or hydronephrosis. A stable
1.2 cm diameter area of heterogeneous low attenuation is seen within
the medial aspect of the mid to lower right kidney. A stable 1.2 cm
cyst is seen along the posterior aspect of the upper pole of the
left kidney. Bladder is unremarkable.

Stomach/Bowel: A 3.0 cm x 8.7 cm x 6.3 cm heterogeneous soft tissue
mass is seen extending from the wall of the lesser sac of the
stomach into the gastric lumen (axial CT images 15 through 25, CT
series number 2). Appendix appears normal. There is no evidence of
bowel dilatation. Numerous diverticula are seen throughout the
descending and sigmoid colon. Moderate severity thickening of the
proximal sigmoid colon is noted (best seen on coronal reformatted
images 43 through 50, CT series number 5).

Vascular/Lymphatic: No significant vascular findings are present.

Reproductive: The prostate gland is markedly enlarged.

Other: Multiple round heterogeneous soft tissue masses of various
sizes are seen within the upper abdomen, adjacent to the lesser sac
of the stomach. The largest measures approximately 5.2 cm x 5.6 cm x
4.6 cm.

Musculoskeletal: Multilevel degenerative changes are seen throughout
the lumbar spine.
IMPRESSION: 1. Marked severity bilateral lower lobe infiltrates.
2. Multiple large heterogeneous low-attenuation liver masses,
consistent with metastatic disease.
3. Large gastric mass, suspicious for primary neoplasm.
4. Multiple soft tissue masses within the mesentery of the upper
abdomen, adjacent to the lesser sac of the stomach, consistent with
metastatic disease.
5. Colonic diverticulosis.
6. Thickening of the proximal sigmoid colon which may represent
sequelae associated with mild colitis/diverticulitis correlation
with follow-up abdomen pelvis CT is recommended to exclude the
presence of an underlying neoplasm.
7. Stable area of low attenuation within the right kidney. While
this may represent a hemorrhagic cyst, correlation with renal
ultrasound is recommended.
8. Cholelithiasis.

## 2021-08-07 IMAGING — US US RENAL
1 series · 14 of 25 positions shown · non-contrast
Comparison: MRI 10/04/2019 and CT 10/11/2019

CLINICAL DATA: Acute kidney injury, recent discovery of abdominal
and liver masses.

EXAM:
RENAL / URINARY TRACT ULTRASOUND COMPLETE

[Series 1: us renal · 14 of 58 slices shown]
[im 1/58]
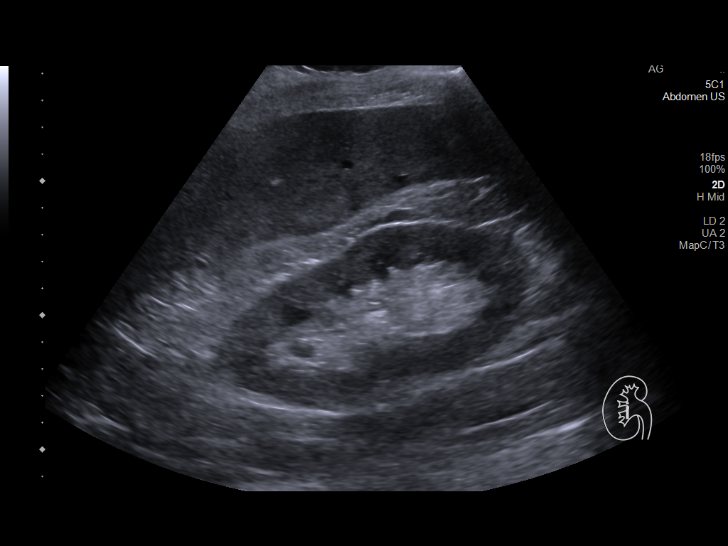
[im 5/58]
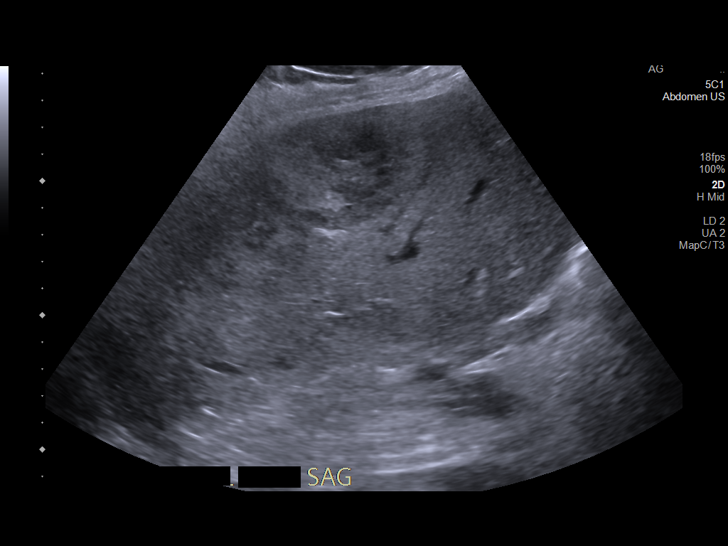
[im 10/58]
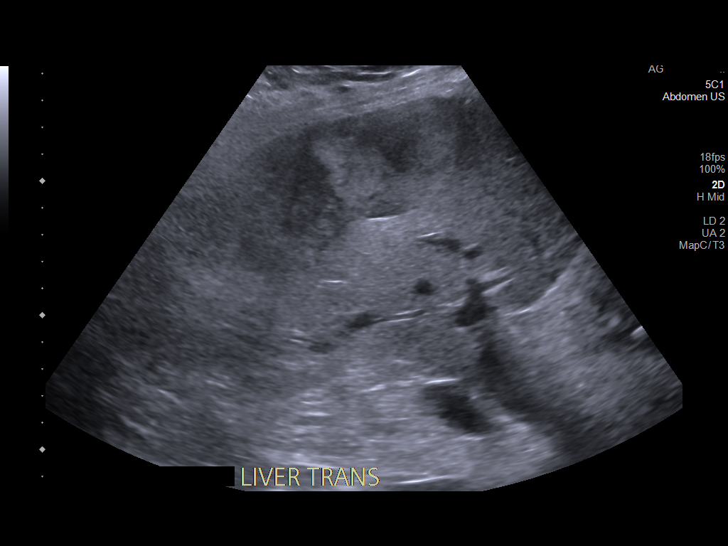
[im 15/58]
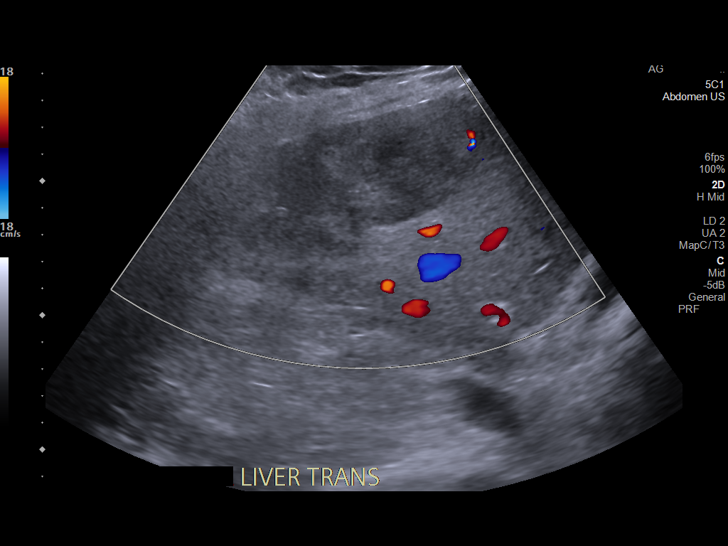
[im 20/58]
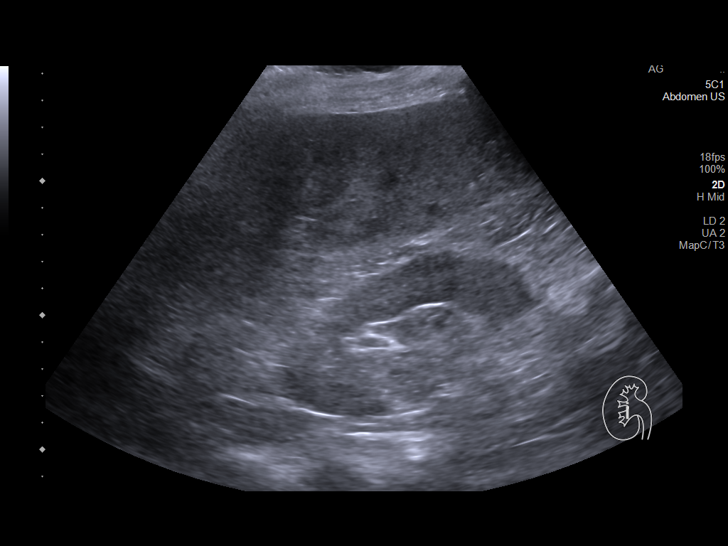
[im 22/58]
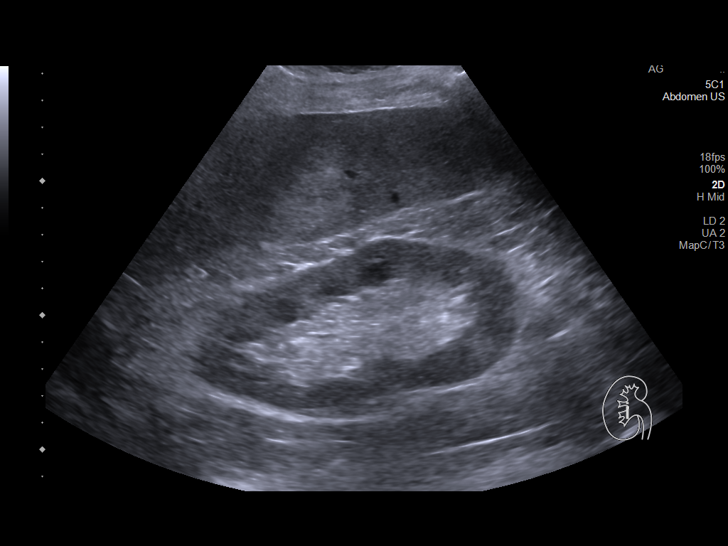
[im 27/58]
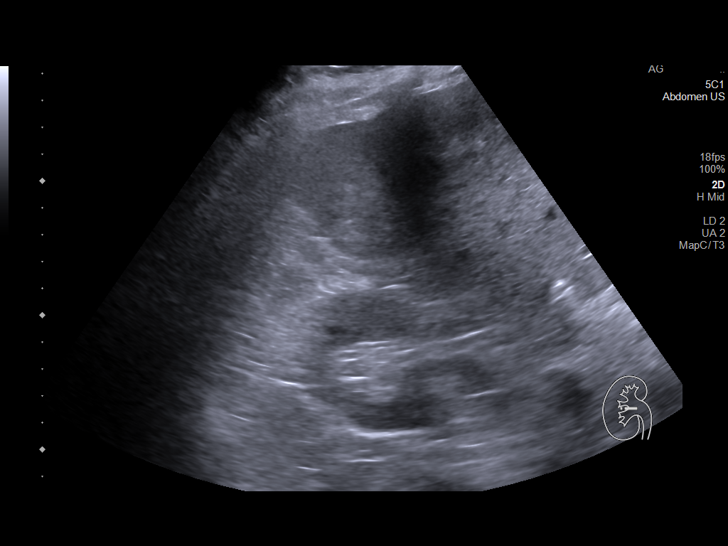
[im 31/58]
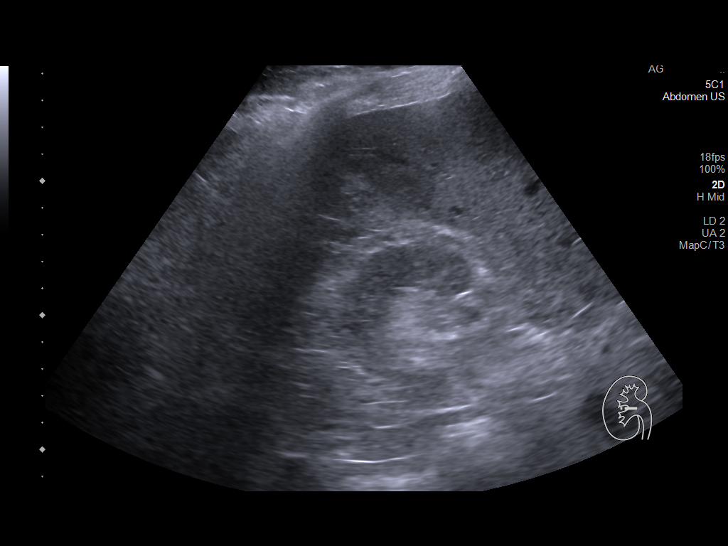
[im 36/58]
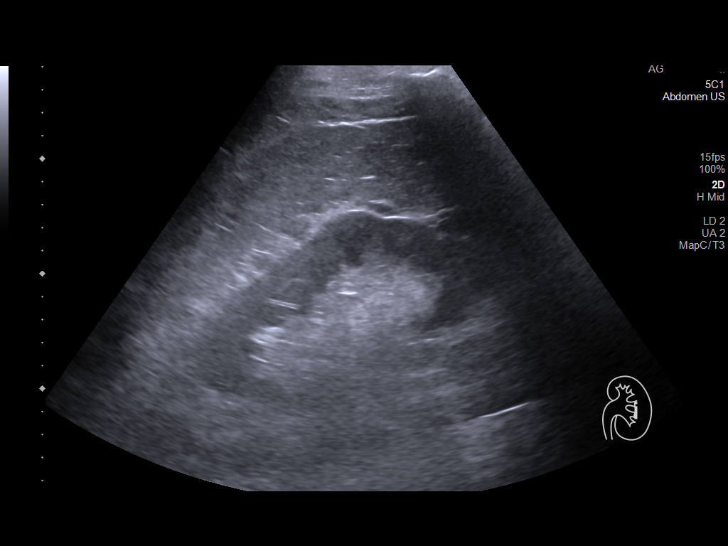
[im 39/58]
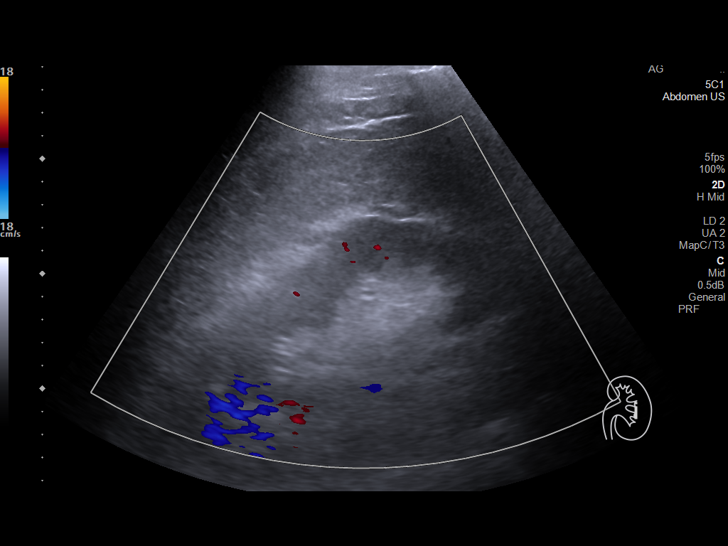
[im 43/58]
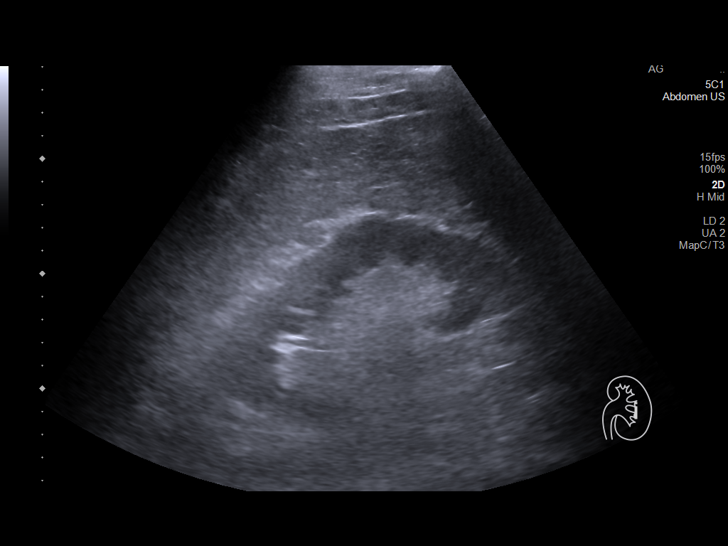
[im 48/58]
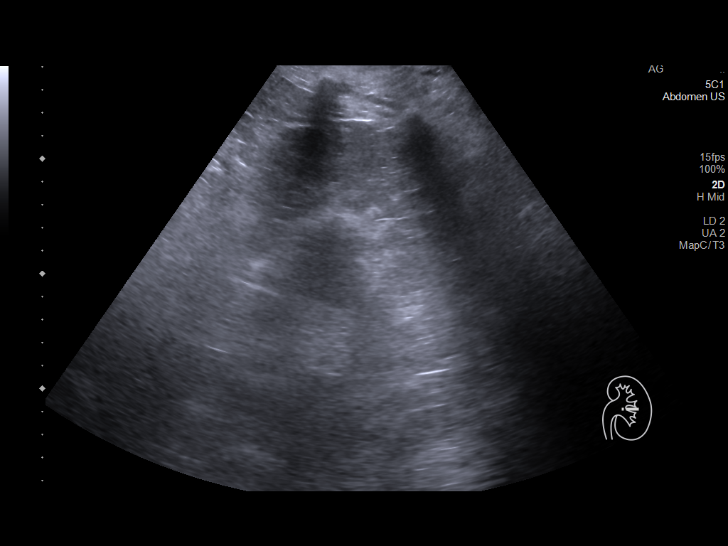
[im 53/58]
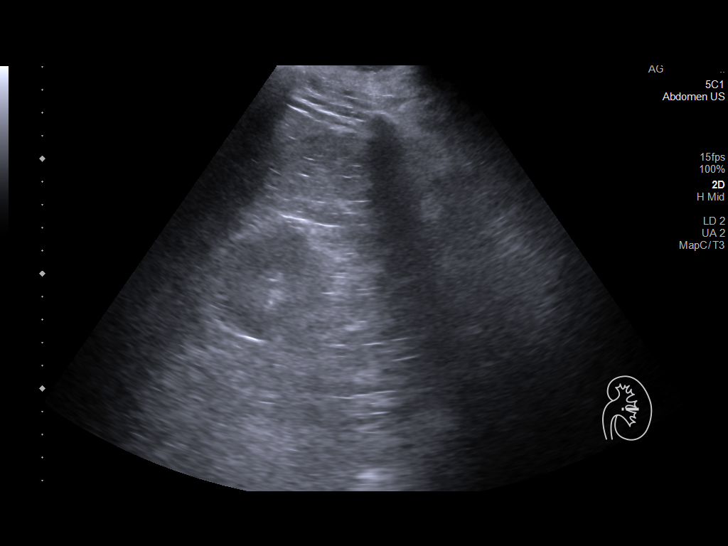
[im 58/58]
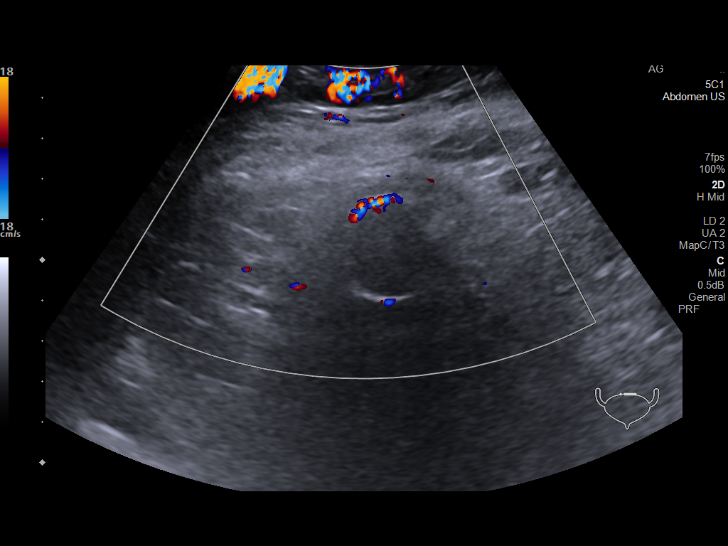

[14 of 25 positions shown; findings below may reference images not displayed]

FINDINGS: Right Kidney:

Renal measurements: 12.9 x 5.6 x 5.7 cm = volume: 213 mL.
Echogenicity within normal limits. No mass or hydronephrosis
visualized.

Left Kidney:

Renal measurements: 14 x 6.3 x 5.6 cm = volume: 258 mL. Echogenicity
within normal limits. No mass or hydronephrosis visualized.

Bladder:

Foley catheter in the urinary bladder.

Other:

Incidental liver masses in the RIGHT hepatic lobe measuring 4.3 x
3.4 x 3.6 cm and 6.3 x 4.4 x 5.6 cm, grossly similar accounting for
differences in angle of measurement and technique as compared to
recent imaging studies. One of these measurements may combine the
dimensions of 2 adjacent masses, would refer to previous CT and MRI
for follow-up measurements.
IMPRESSION: 1. No hydronephrosis.
2. Hepatic metastatic disease better visualized on recent CTs.
